# Patient Record
Sex: Male | Born: 1970 | Race: White | Hispanic: No | Marital: Married | Smoking: Never smoker
Health system: Southern US, Community
[De-identification: ages and names within clinical notes are randomized; demographics above are authoritative.]

## PROBLEM LIST (undated history)

## (undated) DIAGNOSIS — Z973 Presence of spectacles and contact lenses: Secondary | ICD-10-CM

## (undated) DIAGNOSIS — M1990 Unspecified osteoarthritis, unspecified site: Secondary | ICD-10-CM

## (undated) DIAGNOSIS — K642 Third degree hemorrhoids: Secondary | ICD-10-CM

## (undated) DIAGNOSIS — F329 Major depressive disorder, single episode, unspecified: Secondary | ICD-10-CM

## (undated) DIAGNOSIS — Z9689 Presence of other specified functional implants: Secondary | ICD-10-CM

## (undated) DIAGNOSIS — Z915 Personal history of self-harm: Secondary | ICD-10-CM

## (undated) DIAGNOSIS — G4731 Primary central sleep apnea: Secondary | ICD-10-CM

## (undated) DIAGNOSIS — M545 Low back pain, unspecified: Secondary | ICD-10-CM

## (undated) DIAGNOSIS — F419 Anxiety disorder, unspecified: Secondary | ICD-10-CM

## (undated) DIAGNOSIS — G4739 Other sleep apnea: Secondary | ICD-10-CM

## (undated) DIAGNOSIS — G8929 Other chronic pain: Secondary | ICD-10-CM

## (undated) DIAGNOSIS — M2619 Other specified anomalies of jaw-cranial base relationship: Secondary | ICD-10-CM

## (undated) DIAGNOSIS — Z9151 Personal history of suicidal behavior: Secondary | ICD-10-CM

## (undated) DIAGNOSIS — Z9889 Other specified postprocedural states: Secondary | ICD-10-CM

## (undated) DIAGNOSIS — I10 Essential (primary) hypertension: Secondary | ICD-10-CM

## (undated) DIAGNOSIS — F1021 Alcohol dependence, in remission: Secondary | ICD-10-CM

## (undated) DIAGNOSIS — K601 Chronic anal fissure: Secondary | ICD-10-CM

## (undated) DIAGNOSIS — F1920 Other psychoactive substance dependence, uncomplicated: Secondary | ICD-10-CM

## (undated) DIAGNOSIS — F1120 Opioid dependence, uncomplicated: Secondary | ICD-10-CM

## (undated) HISTORY — DX: Other specified anomalies of jaw-cranial base relationship: M26.19

---

## 2002-01-04 ENCOUNTER — Ambulatory Visit (HOSPITAL_COMMUNITY)
Admission: RE | Admit: 2002-01-04 | Discharge: 2002-01-04 | Payer: Self-pay | Admitting: Physical Medicine and Rehabilitation

## 2002-01-04 ENCOUNTER — Encounter: Payer: Self-pay | Admitting: Physical Medicine and Rehabilitation

## 2002-02-15 ENCOUNTER — Encounter: Payer: Self-pay | Admitting: Orthopedic Surgery

## 2002-02-15 ENCOUNTER — Ambulatory Visit (HOSPITAL_COMMUNITY): Admission: RE | Admit: 2002-02-15 | Discharge: 2002-02-15 | Payer: Self-pay | Admitting: Orthopedic Surgery

## 2002-06-05 ENCOUNTER — Encounter: Payer: Self-pay | Admitting: Physical Medicine and Rehabilitation

## 2002-06-05 ENCOUNTER — Encounter
Admission: RE | Admit: 2002-06-05 | Discharge: 2002-06-05 | Payer: Self-pay | Admitting: Physical Medicine and Rehabilitation

## 2002-09-09 ENCOUNTER — Encounter: Payer: Self-pay | Admitting: Orthopaedic Surgery

## 2002-09-11 ENCOUNTER — Inpatient Hospital Stay (HOSPITAL_COMMUNITY): Admission: RE | Admit: 2002-09-11 | Discharge: 2002-09-13 | Payer: Self-pay | Admitting: Orthopaedic Surgery

## 2002-09-11 ENCOUNTER — Encounter: Payer: Self-pay | Admitting: Orthopaedic Surgery

## 2002-09-11 HISTORY — PX: LUMBAR LAMINECTOMY/DECOMPRESSION MICRODISCECTOMY: SHX5026

## 2002-09-13 ENCOUNTER — Encounter: Payer: Self-pay | Admitting: Orthopaedic Surgery

## 2003-07-23 ENCOUNTER — Encounter
Admission: RE | Admit: 2003-07-23 | Discharge: 2003-10-21 | Payer: Self-pay | Admitting: Physical Medicine & Rehabilitation

## 2003-07-30 ENCOUNTER — Encounter
Admission: RE | Admit: 2003-07-30 | Discharge: 2003-09-18 | Payer: Self-pay | Admitting: Physical Medicine & Rehabilitation

## 2003-11-19 ENCOUNTER — Encounter
Admission: RE | Admit: 2003-11-19 | Discharge: 2004-02-17 | Payer: Self-pay | Admitting: Physical Medicine & Rehabilitation

## 2003-11-28 ENCOUNTER — Ambulatory Visit (HOSPITAL_COMMUNITY): Admission: RE | Admit: 2003-11-28 | Discharge: 2003-11-28 | Payer: Self-pay | Admitting: Neurological Surgery

## 2003-12-09 ENCOUNTER — Ambulatory Visit (HOSPITAL_COMMUNITY): Admission: RE | Admit: 2003-12-09 | Discharge: 2003-12-09 | Payer: Self-pay | Admitting: Neurological Surgery

## 2004-01-29 ENCOUNTER — Inpatient Hospital Stay (HOSPITAL_COMMUNITY): Admission: RE | Admit: 2004-01-29 | Discharge: 2004-02-01 | Payer: Self-pay | Admitting: Neurological Surgery

## 2004-01-29 HISTORY — PX: OTHER SURGICAL HISTORY: SHX169

## 2004-04-05 ENCOUNTER — Encounter: Admission: RE | Admit: 2004-04-05 | Discharge: 2004-06-14 | Payer: Self-pay | Admitting: Neurological Surgery

## 2005-03-03 ENCOUNTER — Ambulatory Visit (HOSPITAL_COMMUNITY): Admission: RE | Admit: 2005-03-03 | Discharge: 2005-03-03 | Payer: Self-pay | Admitting: Neurological Surgery

## 2005-03-18 ENCOUNTER — Ambulatory Visit (HOSPITAL_COMMUNITY): Admission: RE | Admit: 2005-03-18 | Discharge: 2005-03-18 | Payer: Self-pay | Admitting: Neurological Surgery

## 2005-08-04 ENCOUNTER — Inpatient Hospital Stay (HOSPITAL_COMMUNITY): Admission: RE | Admit: 2005-08-04 | Discharge: 2005-08-06 | Payer: Self-pay | Admitting: Neurological Surgery

## 2005-08-04 HISTORY — PX: OTHER SURGICAL HISTORY: SHX169

## 2005-08-22 ENCOUNTER — Ambulatory Visit: Payer: Self-pay | Admitting: Family Medicine

## 2005-08-22 ENCOUNTER — Emergency Department (HOSPITAL_COMMUNITY): Admission: EM | Admit: 2005-08-22 | Discharge: 2005-08-22 | Payer: Self-pay | Admitting: Emergency Medicine

## 2006-05-01 ENCOUNTER — Other Ambulatory Visit (HOSPITAL_COMMUNITY): Admission: RE | Admit: 2006-05-01 | Discharge: 2006-07-30 | Payer: Self-pay | Admitting: Psychiatry

## 2006-05-11 ENCOUNTER — Ambulatory Visit: Payer: Self-pay | Admitting: Psychiatry

## 2007-02-27 ENCOUNTER — Encounter: Admission: RE | Admit: 2007-02-27 | Discharge: 2007-02-27 | Payer: Self-pay | Admitting: Neurosurgery

## 2007-03-02 ENCOUNTER — Ambulatory Visit (HOSPITAL_COMMUNITY): Admission: RE | Admit: 2007-03-02 | Discharge: 2007-03-03 | Payer: Self-pay | Admitting: Neurosurgery

## 2007-03-02 HISTORY — PX: THORACIC DISCECTOMY: SHX100

## 2007-11-15 ENCOUNTER — Encounter
Admission: RE | Admit: 2007-11-15 | Discharge: 2008-02-13 | Payer: Self-pay | Admitting: Physical Medicine & Rehabilitation

## 2008-01-03 ENCOUNTER — Ambulatory Visit: Payer: Self-pay | Admitting: Physical Medicine & Rehabilitation

## 2008-02-27 ENCOUNTER — Encounter
Admission: RE | Admit: 2008-02-27 | Discharge: 2008-04-25 | Payer: Self-pay | Admitting: Physical Medicine & Rehabilitation

## 2008-02-29 ENCOUNTER — Ambulatory Visit: Payer: Self-pay | Admitting: Physical Medicine & Rehabilitation

## 2008-04-25 ENCOUNTER — Ambulatory Visit: Payer: Self-pay | Admitting: Physical Medicine & Rehabilitation

## 2008-08-12 ENCOUNTER — Encounter
Admission: RE | Admit: 2008-08-12 | Discharge: 2008-08-13 | Payer: Self-pay | Admitting: Physical Medicine & Rehabilitation

## 2008-08-13 ENCOUNTER — Ambulatory Visit: Payer: Self-pay | Admitting: Physical Medicine & Rehabilitation

## 2008-09-06 ENCOUNTER — Encounter: Admission: RE | Admit: 2008-09-06 | Discharge: 2008-09-06 | Payer: Self-pay | Admitting: Neurosurgery

## 2010-06-27 HISTORY — PX: NASAL SINUS SURGERY: SHX719

## 2010-08-21 ENCOUNTER — Emergency Department (HOSPITAL_BASED_OUTPATIENT_CLINIC_OR_DEPARTMENT_OTHER)
Admission: EM | Admit: 2010-08-21 | Discharge: 2010-08-21 | Disposition: A | Discharge status: Home / Self Care | Payer: 59 | Attending: Emergency Medicine | Admitting: Emergency Medicine

## 2010-08-21 DIAGNOSIS — E785 Hyperlipidemia, unspecified: Secondary | ICD-10-CM | POA: Insufficient documentation

## 2010-08-21 DIAGNOSIS — F1910 Other psychoactive substance abuse, uncomplicated: Secondary | ICD-10-CM | POA: Insufficient documentation

## 2010-08-21 DIAGNOSIS — T50991A Poisoning by other drugs, medicaments and biological substances, accidental (unintentional), initial encounter: Secondary | ICD-10-CM | POA: Insufficient documentation

## 2010-08-21 DIAGNOSIS — I10 Essential (primary) hypertension: Secondary | ICD-10-CM | POA: Insufficient documentation

## 2010-08-21 LAB — URINALYSIS, ROUTINE W REFLEX MICROSCOPIC
Bilirubin Urine: NEGATIVE
Hgb urine dipstick: NEGATIVE
Ketones, ur: NEGATIVE mg/dL
Nitrite: NEGATIVE
Protein, ur: NEGATIVE mg/dL
Specific Gravity, Urine: 1.015 (ref 1.005–1.030)
Urine Glucose, Fasting: NEGATIVE mg/dL
Urobilinogen, UA: 0.2 mg/dL (ref 0.0–1.0)
pH: 5.5 (ref 5.0–8.0)

## 2010-08-21 LAB — COMPREHENSIVE METABOLIC PANEL
ALT: 38 U/L (ref 0–53)
AST: 33 U/L (ref 0–37)
Albumin: 5.4 g/dL — ABNORMAL HIGH (ref 3.5–5.2)
Alkaline Phosphatase: 106 U/L (ref 39–117)
BUN: 14 mg/dL (ref 6–23)
CO2: 20 mEq/L (ref 19–32)
Calcium: 9.9 mg/dL (ref 8.4–10.5)
Chloride: 103 mEq/L (ref 96–112)
Creatinine, Ser: 0.9 mg/dL (ref 0.4–1.5)
GFR calc Af Amer: >60 mL/min (ref >60)
GFR calc non Af Amer: >60 mL/min (ref >60)
Glucose, Bld: 129 mg/dL — ABNORMAL HIGH (ref 70–99)
Potassium: 4.3 mEq/L (ref 3.5–5.1)
Sodium: 142 mEq/L (ref 135–145)
Total Bilirubin: 1.1 mg/dL (ref 0.3–1.2)
Total Protein: 9 g/dL — ABNORMAL HIGH (ref 6.0–8.3)

## 2010-08-21 LAB — CBC
HCT: 40.7 % (ref 39.0–52.0)
Hemoglobin: 14.8 g/dL (ref 13.0–17.0)
MCH: 31.6 pg (ref 26.0–34.0)
MCHC: 36.4 g/dL — ABNORMAL HIGH (ref 30.0–36.0)
MCV: 86.8 fL (ref 78.0–100.0)
Platelets: 192 10*3/uL (ref 150–400)
RBC: 4.69 MIL/uL (ref 4.22–5.81)
RDW: 13.6 % (ref 11.5–15.5)
WBC: 8.5 10*3/uL (ref 4.0–10.5)

## 2010-08-21 LAB — ETHANOL: Alcohol, Ethyl (B): <10 mg/dL (ref 0–10)

## 2010-08-21 LAB — DIFFERENTIAL
Basophils Absolute: 0 10*3/uL (ref 0.0–0.1)
Basophils Relative: 0 % (ref 0–1)
Eosinophils Absolute: 0 10*3/uL (ref 0.0–0.7)
Eosinophils Relative: 0 % (ref 0–5)
Lymphocytes Relative: 20 % (ref 12–46)
Lymphs Abs: 1.7 10*3/uL (ref 0.7–4.0)
Monocytes Absolute: 0.7 10*3/uL (ref 0.1–1.0)
Monocytes Relative: 8 % (ref 3–12)
Neutro Abs: 6.1 10*3/uL (ref 1.7–7.7)
Neutrophils Relative %: 72 % (ref 43–77)

## 2010-08-21 LAB — POCT TOXICOLOGY PANEL
Cocaine: POSITIVE
TCA Scrn: POSITIVE

## 2010-08-22 ENCOUNTER — Emergency Department (HOSPITAL_COMMUNITY)
Admission: EM | Admit: 2010-08-22 | Discharge: 2010-08-23 | Disposition: A | Discharge status: Other Acute Inpatient Hospital | Payer: 59 | Attending: Emergency Medicine | Admitting: Emergency Medicine

## 2010-08-22 ENCOUNTER — Emergency Department (HOSPITAL_COMMUNITY): Payer: 59

## 2010-08-22 DIAGNOSIS — R45851 Suicidal ideations: Secondary | ICD-10-CM | POA: Insufficient documentation

## 2010-08-22 DIAGNOSIS — I10 Essential (primary) hypertension: Secondary | ICD-10-CM | POA: Insufficient documentation

## 2010-08-22 DIAGNOSIS — F17200 Nicotine dependence, unspecified, uncomplicated: Secondary | ICD-10-CM | POA: Insufficient documentation

## 2010-08-22 DIAGNOSIS — F1410 Cocaine abuse, uncomplicated: Secondary | ICD-10-CM | POA: Insufficient documentation

## 2010-08-22 DIAGNOSIS — E785 Hyperlipidemia, unspecified: Secondary | ICD-10-CM | POA: Insufficient documentation

## 2010-08-22 DIAGNOSIS — Z79899 Other long term (current) drug therapy: Secondary | ICD-10-CM | POA: Insufficient documentation

## 2010-08-22 DIAGNOSIS — F1910 Other psychoactive substance abuse, uncomplicated: Secondary | ICD-10-CM | POA: Insufficient documentation

## 2010-08-22 LAB — CBC
HCT: 43.4 % (ref 39.0–52.0)
Hemoglobin: 15.2 g/dL (ref 13.0–17.0)
MCH: 32.1 pg (ref 26.0–34.0)
MCHC: 35 g/dL (ref 30.0–36.0)
MCV: 91.8 fL (ref 78.0–100.0)
Platelets: 160 10*3/uL (ref 150–400)
RBC: 4.73 MIL/uL (ref 4.22–5.81)
RDW: 13.4 % (ref 11.5–15.5)
WBC: 6.4 10*3/uL (ref 4.0–10.5)

## 2010-08-22 LAB — COMPREHENSIVE METABOLIC PANEL
ALT: 25 U/L (ref 0–53)
AST: 23 U/L (ref 0–37)
Albumin: 4.6 g/dL (ref 3.5–5.2)
Alkaline Phosphatase: 102 U/L (ref 39–117)
BUN: 14 mg/dL (ref 6–23)
CO2: 24 mEq/L (ref 19–32)
Calcium: 9.8 mg/dL (ref 8.4–10.5)
Chloride: 104 mEq/L (ref 96–112)
Creatinine, Ser: 0.92 mg/dL (ref 0.4–1.5)
GFR calc Af Amer: >60 mL/min (ref >60)
GFR calc non Af Amer: >60 mL/min (ref >60)
Glucose, Bld: 96 mg/dL (ref 70–99)
Potassium: 4 mEq/L (ref 3.5–5.1)
Sodium: 135 mEq/L (ref 135–145)
Total Bilirubin: 1.2 mg/dL (ref 0.3–1.2)
Total Protein: 8.3 g/dL (ref 6.0–8.3)

## 2010-08-22 LAB — URINALYSIS, ROUTINE W REFLEX MICROSCOPIC
Bilirubin Urine: NEGATIVE
Hgb urine dipstick: NEGATIVE
Ketones, ur: NEGATIVE mg/dL
Nitrite: NEGATIVE
Protein, ur: NEGATIVE mg/dL
Specific Gravity, Urine: 1.013 (ref 1.005–1.030)
Urine Glucose, Fasting: NEGATIVE mg/dL
Urobilinogen, UA: 0.2 mg/dL (ref 0.0–1.0)
pH: 6 (ref 5.0–8.0)

## 2010-08-22 LAB — RAPID URINE DRUG SCREEN, HOSP PERFORMED
Amphetamines: NOT DETECTED
Barbiturates: NOT DETECTED
Benzodiazepines: POSITIVE — AB
Cocaine: POSITIVE — AB
Opiates: NOT DETECTED
Tetrahydrocannabinol: NOT DETECTED

## 2010-08-22 LAB — DIFFERENTIAL
Basophils Absolute: 0 10*3/uL (ref 0.0–0.1)
Basophils Relative: 0 % (ref 0–1)
Eosinophils Absolute: 0.1 10*3/uL (ref 0.0–0.7)
Eosinophils Relative: 1 % (ref 0–5)
Lymphocytes Relative: 23 % (ref 12–46)
Lymphs Abs: 1.5 10*3/uL (ref 0.7–4.0)
Monocytes Absolute: 0.4 10*3/uL (ref 0.1–1.0)
Monocytes Relative: 6 % (ref 3–12)
Neutro Abs: 4.5 10*3/uL (ref 1.7–7.7)
Neutrophils Relative %: 70 % (ref 43–77)

## 2010-08-22 LAB — PROTIME-INR
INR: 1.02 (ref 0.00–1.49)
Prothrombin Time: 13.6 seconds (ref 11.6–15.2)

## 2010-08-22 LAB — AMMONIA: Ammonia: 21 umol/L (ref 11–35)

## 2010-08-22 LAB — APTT: aPTT: 27 seconds (ref 24–37)

## 2010-08-22 LAB — ACETAMINOPHEN LEVEL: Acetaminophen (Tylenol), Serum: <10 ug/mL — ABNORMAL LOW (ref 10–30)

## 2010-08-22 LAB — ETHANOL: Alcohol, Ethyl (B): <5 mg/dL (ref 0–10)

## 2010-08-22 LAB — SALICYLATE LEVEL: Salicylate Lvl: <4 mg/dL (ref 2.8–20.0)

## 2010-08-22 LAB — OSMOLALITY: Osmolality: 285 mOsm/kg (ref 275–300)

## 2010-08-23 ENCOUNTER — Inpatient Hospital Stay (HOSPITAL_COMMUNITY)
Admission: AD | Admit: 2010-08-23 | Discharge: 2010-08-25 | DRG: 897 | Disposition: A | Discharge status: Home / Self Care | Payer: 59 | Source: Ambulatory Visit | Attending: Psychiatry | Admitting: Psychiatry

## 2010-08-23 DIAGNOSIS — F1910 Other psychoactive substance abuse, uncomplicated: Secondary | ICD-10-CM

## 2010-08-23 DIAGNOSIS — I10 Essential (primary) hypertension: Secondary | ICD-10-CM

## 2010-08-23 DIAGNOSIS — Z880 Allergy status to penicillin: Secondary | ICD-10-CM

## 2010-08-23 DIAGNOSIS — F319 Bipolar disorder, unspecified: Secondary | ICD-10-CM

## 2010-08-23 DIAGNOSIS — F1010 Alcohol abuse, uncomplicated: Secondary | ICD-10-CM

## 2010-08-23 DIAGNOSIS — M549 Dorsalgia, unspecified: Secondary | ICD-10-CM

## 2010-08-23 DIAGNOSIS — F39 Unspecified mood [affective] disorder: Secondary | ICD-10-CM

## 2010-08-23 DIAGNOSIS — F1994 Other psychoactive substance use, unspecified with psychoactive substance-induced mood disorder: Principal | ICD-10-CM

## 2010-08-24 LAB — URINE CULTURE
Colony Count: NO GROWTH
Culture  Setup Time: 201202262106
Culture: NO GROWTH

## 2010-08-25 NOTE — H&P (Signed)
NAMEJIGAR, ZIELKE NO.:  [id number]  MEDICAL RECORD NO.:  [id number]           PATIENT TYPE:  I  LOCATION:  0301                          FACILITY:  [facility]  PHYSICIAN:  Anselm Jungling, MD  DATE OF BIRTH:  1971/05/14  DATE OF ADMISSION:  08/23/2010 DATE OF DISCHARGE:                      PSYCHIATRIC ADMISSION ASSESSMENT   A 40 year old male voluntarily admitted on August 23, 2010.  HISTORY OF PRESENT ILLNESS:  The patient states that he had gotten drunk, he did not want to "face the music" from his wife and took some Ambien and Flexeril to "sleep it off".  He denied this was a suicide attempt.  He has a history of alcohol and substance use.  PAST PSYCHIATRIC HISTORY:  First admission to [facility]. He has been in rehab 3 times before with his last rehab about 3 years ago and that was for alcohol and cocaine.  Sees Dr. Evelene Croon for depression and anxiety on an outpatient basis.  SOCIAL HISTORY:  The patient is married.  He is on disability for history of back surgeries.  He resides in [place].  FAMILY HISTORY:  Unknown.  ALCOHOL AND DRUG HISTORY:  As above.  Drinking and a history of cocaine and marijuana use.  PRIMARY CARE PROVIDER:  Dr. Jerline Pain.  MEDICAL PROBLEMS:  History of hypertension, 4 back surgeries with his last surgery in 2009.  MEDICATIONS: 1. Takes Neurontin 600 mg b.i.d. 2. Zoloft 100 mg daily.  Has been on Zoloft for approximately 4 years.     Reports compliance. 3. Atenolol 25 mg daily. 4. Norvasc 10 mg daily. 5. Oxycodone for back pain. 6. Flexeril for back pain.  DRUG ALLERGIES:  LISINOPRIL with a problem with swelling, PENICILLIN.  PHYSICAL EXAM:  Was done at the emergency department.  This is a middle- aged male, well-nourished, in no acute distress.  He offers no complaints.  His urine drug screen positive for cocaine, positive for benzodiazepines.  Ammonia level of 25.  Salicylate less than  4. Acetaminophen level less than 10.  MENTAL STATUS EXAM:  He is fully alert and cooperative, resting in bed with good eye contact.  His mood is euthymic.  Affect he appears somewhat depressed.  Thought process are coherent, goal directed.  No evidence of any psychotic symptoms.  Denies any suicidal or homicidal thoughts.  Cognitive function intact.  Memory appears to be good. Judgment and insight are fair.  The patient is not clear what he wants to do after discharge about going back to rehab center.  He knows he needs to stop.  DIAGNOSES:  AXIS I:  Mood disorder, polysubstance abuse. AXIS II:  Deferred. AXIS III:  History of hypertension and back pain. AXIS IV:  Problems with primary support and medical issues. AXIS V:  Current is 40.  PLAN:  To put the patient on Librium protocol, monitor withdrawal symptoms, resume his Zoloft and hypertensive medications.  Have contact with his wife.  Assess his motivation for rehab.  His tentative length of stay at this time is 2-4 days.     Landry Corporal, N.P.   ______________________________ Anselm Jungling, MD  JO/MEDQ  D:  08/23/2010  T:  08/23/2010  Job:  [id number]  Electronically Signed by Limmie Patricia.P. on 08/24/2010 10:47:41 AM Electronically Signed by Geralyn Flash MD on 08/25/2010 12:56:35 PM

## 2010-08-26 ENCOUNTER — Other Ambulatory Visit: Payer: Self-pay | Admitting: Student

## 2010-08-26 ENCOUNTER — Other Ambulatory Visit: Payer: Self-pay | Admitting: *Deleted

## 2010-08-26 DIAGNOSIS — M545 Low back pain, unspecified: Secondary | ICD-10-CM

## 2010-08-30 ENCOUNTER — Other Ambulatory Visit (HOSPITAL_COMMUNITY): Payer: 59 | Attending: Psychiatry | Admitting: Psychiatry

## 2010-08-30 DIAGNOSIS — F1020 Alcohol dependence, uncomplicated: Secondary | ICD-10-CM | POA: Insufficient documentation

## 2010-08-30 DIAGNOSIS — F3289 Other specified depressive episodes: Secondary | ICD-10-CM | POA: Insufficient documentation

## 2010-08-30 DIAGNOSIS — F329 Major depressive disorder, single episode, unspecified: Secondary | ICD-10-CM | POA: Insufficient documentation

## 2010-08-31 ENCOUNTER — Ambulatory Visit
Admission: RE | Admit: 2010-08-31 | Discharge: 2010-08-31 | Disposition: A | Discharge status: Home / Self Care | Payer: 59 | Source: Ambulatory Visit | Attending: *Deleted | Admitting: *Deleted

## 2010-08-31 DIAGNOSIS — M545 Low back pain, unspecified: Secondary | ICD-10-CM

## 2010-08-31 MED ORDER — GADOBENATE DIMEGLUMINE 529 MG/ML IV SOLN
20.0000 mL | Freq: Once | INTRAVENOUS | Status: AC | PRN
  Administered 2010-08-31: 20 mL via INTRAVENOUS

## 2010-09-01 ENCOUNTER — Other Ambulatory Visit (HOSPITAL_BASED_OUTPATIENT_CLINIC_OR_DEPARTMENT_OTHER): Payer: 59 | Admitting: Psychiatry

## 2010-09-01 DIAGNOSIS — F1020 Alcohol dependence, uncomplicated: Secondary | ICD-10-CM

## 2010-09-01 DIAGNOSIS — F1120 Opioid dependence, uncomplicated: Secondary | ICD-10-CM

## 2010-09-03 ENCOUNTER — Other Ambulatory Visit (HOSPITAL_COMMUNITY): Payer: 59 | Admitting: Psychiatry

## 2010-09-06 ENCOUNTER — Other Ambulatory Visit (HOSPITAL_COMMUNITY): Payer: 59 | Admitting: Psychiatry

## 2010-09-08 ENCOUNTER — Other Ambulatory Visit (HOSPITAL_COMMUNITY): Payer: Self-pay | Admitting: Psychiatry

## 2010-09-08 ENCOUNTER — Other Ambulatory Visit (HOSPITAL_COMMUNITY): Payer: 59 | Admitting: Psychiatry

## 2010-09-09 LAB — URINE DRUGS OF ABUSE SCREEN W ALC, ROUTINE (REF LAB)
Amphetamine Screen, Ur: NEGATIVE
Barbiturate Quant, Ur: NEGATIVE
Benzodiazepines.: POSITIVE — AB
Cocaine Metabolites: NEGATIVE
Creatinine,U: 218.3 mg/dL
Ethyl Alcohol: <10 mg/dL (ref <10)
Marijuana Metabolite: NEGATIVE
Methadone: NEGATIVE
Opiate Screen, Urine: NEGATIVE
Phencyclidine (PCP): NEGATIVE
Propoxyphene: NEGATIVE

## 2010-09-10 ENCOUNTER — Other Ambulatory Visit (HOSPITAL_COMMUNITY): Payer: 59 | Admitting: Psychiatry

## 2010-09-13 ENCOUNTER — Other Ambulatory Visit (HOSPITAL_COMMUNITY): Payer: 59 | Admitting: Psychiatry

## 2010-09-14 LAB — BENZODIAZEPINE, QUANTITATIVE, URINE
Alprazolam (GC/LC/MS), ur confirm: NEGATIVE NG/ML
Flurazepam GC/MS Conf: NEGATIVE NG/ML
Lorazepam UR QT: NEGATIVE NG/ML
Nordiazepam GC/MS Conf: NEGATIVE NG/ML
Oxazepam GC/MS Conf: NEGATIVE NG/ML
Temazepam GC/MS Conf: NEGATIVE NG/ML

## 2010-09-15 ENCOUNTER — Other Ambulatory Visit (HOSPITAL_COMMUNITY): Payer: 59 | Admitting: Psychiatry

## 2010-09-17 ENCOUNTER — Other Ambulatory Visit (HOSPITAL_COMMUNITY): Payer: 59 | Admitting: Psychiatry

## 2010-09-20 ENCOUNTER — Other Ambulatory Visit (HOSPITAL_COMMUNITY): Payer: 59 | Admitting: Psychiatry

## 2010-09-22 ENCOUNTER — Other Ambulatory Visit (HOSPITAL_COMMUNITY): Payer: 59 | Admitting: Psychiatry

## 2010-09-24 ENCOUNTER — Other Ambulatory Visit (HOSPITAL_COMMUNITY): Payer: Self-pay | Admitting: Psychiatry

## 2010-09-24 ENCOUNTER — Other Ambulatory Visit (HOSPITAL_COMMUNITY): Payer: 59 | Admitting: Psychiatry

## 2010-09-25 LAB — URINE DRUGS OF ABUSE SCREEN W ALC, ROUTINE (REF LAB)
Amphetamine Screen, Ur: NEGATIVE
Barbiturate Quant, Ur: NEGATIVE
Benzodiazepines.: NEGATIVE
Cocaine Metabolites: NEGATIVE
Creatinine,U: 156.5 mg/dL
Ethyl Alcohol: <10 mg/dL (ref <10)
Marijuana Metabolite: NEGATIVE
Methadone: NEGATIVE
Opiate Screen, Urine: NEGATIVE
Phencyclidine (PCP): NEGATIVE
Propoxyphene: NEGATIVE

## 2010-09-27 ENCOUNTER — Other Ambulatory Visit (HOSPITAL_COMMUNITY): Payer: 59 | Attending: Psychiatry | Admitting: Psychiatry

## 2010-09-27 DIAGNOSIS — F329 Major depressive disorder, single episode, unspecified: Secondary | ICD-10-CM | POA: Insufficient documentation

## 2010-09-27 DIAGNOSIS — F1020 Alcohol dependence, uncomplicated: Secondary | ICD-10-CM | POA: Insufficient documentation

## 2010-09-27 DIAGNOSIS — F3289 Other specified depressive episodes: Secondary | ICD-10-CM | POA: Insufficient documentation

## 2010-09-29 ENCOUNTER — Other Ambulatory Visit (HOSPITAL_COMMUNITY): Payer: 59 | Admitting: Psychiatry

## 2010-10-01 ENCOUNTER — Other Ambulatory Visit (HOSPITAL_COMMUNITY): Payer: 59 | Admitting: Psychiatry

## 2010-10-04 ENCOUNTER — Other Ambulatory Visit (HOSPITAL_COMMUNITY): Payer: 59 | Admitting: Psychiatry

## 2010-10-06 ENCOUNTER — Other Ambulatory Visit (HOSPITAL_COMMUNITY): Payer: Self-pay | Admitting: Psychiatry

## 2010-10-06 ENCOUNTER — Other Ambulatory Visit (HOSPITAL_COMMUNITY): Payer: 59 | Admitting: Psychiatry

## 2010-10-07 LAB — URINE DRUGS OF ABUSE SCREEN W ALC, ROUTINE (REF LAB)
Amphetamine Screen, Ur: POSITIVE — AB
Barbiturate Quant, Ur: NEGATIVE
Benzodiazepines.: POSITIVE — AB
Cocaine Metabolites: NEGATIVE
Creatinine,U: 249 mg/dL
Ethyl Alcohol: <10 mg/dL (ref <10)
Marijuana Metabolite: NEGATIVE
Methadone: NEGATIVE
Opiate Screen, Urine: NEGATIVE
Phencyclidine (PCP): NEGATIVE
Propoxyphene: NEGATIVE

## 2010-10-08 ENCOUNTER — Other Ambulatory Visit (HOSPITAL_COMMUNITY): Payer: 59 | Admitting: Psychiatry

## 2010-10-08 ENCOUNTER — Other Ambulatory Visit (HOSPITAL_COMMUNITY): Payer: Self-pay | Admitting: Psychiatry

## 2010-10-09 LAB — URINE DRUGS OF ABUSE SCREEN W ALC, ROUTINE (REF LAB)
Amphetamine Screen, Ur: NEGATIVE
Barbiturate Quant, Ur: NEGATIVE
Benzodiazepines.: NEGATIVE
Cocaine Metabolites: NEGATIVE
Creatinine,U: 130.5 mg/dL
Ethyl Alcohol: <10 mg/dL (ref <10)
Marijuana Metabolite: NEGATIVE
Methadone: NEGATIVE
Opiate Screen, Urine: NEGATIVE
Phencyclidine (PCP): NEGATIVE
Propoxyphene: NEGATIVE

## 2010-10-11 ENCOUNTER — Other Ambulatory Visit (HOSPITAL_COMMUNITY): Payer: 59 | Admitting: Psychiatry

## 2010-10-11 LAB — AMPHETAMINES URINE CONFIRMATION
Amphetamines: 1714 NG/ML — ABNORMAL HIGH
Methamphetamine GC/MS, Ur: NEGATIVE NG/ML
Methylenedioxyamphetamine: NEGATIVE NG/ML
Methylenedioxyethylamphetamine: NEGATIVE NG/ML
Methylenedioxymethamphetamine: NEGATIVE NG/ML

## 2010-10-11 LAB — BENZODIAZEPINE, QUANTITATIVE, URINE
Alprazolam (GC/LC/MS), ur confirm: NEGATIVE NG/ML
Flurazepam GC/MS Conf: NEGATIVE NG/ML
Lorazepam UR QT: NEGATIVE NG/ML
Nordiazepam GC/MS Conf: NEGATIVE NG/ML
Oxazepam GC/MS Conf: NEGATIVE NG/ML
Temazepam GC/MS Conf: NEGATIVE NG/ML

## 2010-10-13 ENCOUNTER — Other Ambulatory Visit (HOSPITAL_COMMUNITY): Payer: 59 | Admitting: Psychiatry

## 2010-10-15 ENCOUNTER — Other Ambulatory Visit (HOSPITAL_COMMUNITY): Payer: 59 | Admitting: Psychiatry

## 2010-10-18 ENCOUNTER — Other Ambulatory Visit (HOSPITAL_COMMUNITY): Payer: 59 | Admitting: Psychiatry

## 2010-10-20 ENCOUNTER — Other Ambulatory Visit (HOSPITAL_COMMUNITY): Payer: 59 | Admitting: Psychiatry

## 2010-10-22 ENCOUNTER — Other Ambulatory Visit (HOSPITAL_COMMUNITY): Payer: 59 | Admitting: Psychiatry

## 2010-10-25 ENCOUNTER — Other Ambulatory Visit (HOSPITAL_COMMUNITY): Payer: 59 | Attending: Psychiatry | Admitting: Psychiatry

## 2010-10-25 DIAGNOSIS — F1020 Alcohol dependence, uncomplicated: Secondary | ICD-10-CM | POA: Insufficient documentation

## 2010-10-25 DIAGNOSIS — F329 Major depressive disorder, single episode, unspecified: Secondary | ICD-10-CM | POA: Insufficient documentation

## 2010-10-25 DIAGNOSIS — F3289 Other specified depressive episodes: Secondary | ICD-10-CM | POA: Insufficient documentation

## 2010-10-27 ENCOUNTER — Other Ambulatory Visit (HOSPITAL_COMMUNITY): Payer: 59 | Attending: Psychiatry | Admitting: Psychiatry

## 2010-10-27 ENCOUNTER — Other Ambulatory Visit (HOSPITAL_COMMUNITY): Payer: Self-pay | Admitting: Psychiatry

## 2010-10-27 DIAGNOSIS — F329 Major depressive disorder, single episode, unspecified: Secondary | ICD-10-CM | POA: Insufficient documentation

## 2010-10-27 DIAGNOSIS — F3289 Other specified depressive episodes: Secondary | ICD-10-CM | POA: Insufficient documentation

## 2010-10-27 DIAGNOSIS — F1020 Alcohol dependence, uncomplicated: Secondary | ICD-10-CM | POA: Insufficient documentation

## 2010-10-28 LAB — URINE DRUGS OF ABUSE SCREEN W ALC, ROUTINE (REF LAB)
Amphetamine Screen, Ur: NEGATIVE
Barbiturate Quant, Ur: NEGATIVE
Benzodiazepines.: NEGATIVE
Cocaine Metabolites: NEGATIVE
Creatinine,U: 193.5 mg/dL
Ethyl Alcohol: <10 mg/dL (ref <10)
Marijuana Metabolite: NEGATIVE
Methadone: NEGATIVE
Opiate Screen, Urine: NEGATIVE
Phencyclidine (PCP): NEGATIVE
Propoxyphene: NEGATIVE

## 2010-10-29 ENCOUNTER — Other Ambulatory Visit (HOSPITAL_COMMUNITY): Payer: 59 | Admitting: Psychiatry

## 2010-11-01 ENCOUNTER — Other Ambulatory Visit (HOSPITAL_COMMUNITY): Payer: 59 | Admitting: Psychiatry

## 2010-11-01 ENCOUNTER — Other Ambulatory Visit (HOSPITAL_COMMUNITY): Payer: 59 | Attending: Psychiatry | Admitting: Psychiatry

## 2010-11-03 ENCOUNTER — Other Ambulatory Visit (HOSPITAL_COMMUNITY): Payer: 59 | Admitting: Psychiatry

## 2010-11-05 ENCOUNTER — Other Ambulatory Visit (HOSPITAL_COMMUNITY): Payer: Self-pay | Admitting: Psychiatry

## 2010-11-05 ENCOUNTER — Other Ambulatory Visit (HOSPITAL_COMMUNITY): Payer: 59 | Admitting: Psychiatry

## 2010-11-06 LAB — URINE DRUGS OF ABUSE SCREEN W ALC, ROUTINE (REF LAB)
Amphetamine Screen, Ur: NEGATIVE
Barbiturate Quant, Ur: NEGATIVE
Benzodiazepines.: NEGATIVE
Cocaine Metabolites: NEGATIVE
Creatinine,U: 103.1 mg/dL
Ethyl Alcohol: <10 mg/dL (ref <10)
Marijuana Metabolite: NEGATIVE
Methadone: NEGATIVE
Opiate Screen, Urine: NEGATIVE
Phencyclidine (PCP): NEGATIVE
Propoxyphene: NEGATIVE

## 2010-11-08 ENCOUNTER — Other Ambulatory Visit (HOSPITAL_COMMUNITY): Payer: 59 | Admitting: Psychiatry

## 2010-11-09 NOTE — Assessment & Plan Note (Signed)
[name] returns to clinic today for followup evaluation.  I first and  last saw the patient in this office on January 03, 2008, on referral from  Dr. Jordan [name].  The patient is a 40 year old male with a history of 3 lumbar  surgeries.  The last having been done on March 02, 2007, when he  underwent T11-T12 transpedicular microdiscectomy performed by Dr. Jordan [name].  He has had persistent back pain for several years now.  When we saw him  in July, we had started him on hydrocodone 10/325 approximately 5 per  day.  He reports that he has had some increased pain recently as he was  doing some extra work around the home and had increased pain such that  he had to increase it up to approximately 6-7 tablets per day.  He still  reports that there is some other stress in his life, and he needs to get  that under control to alleviate some of this back pain.  He does not  feel that he is getting much relief from the [facility] medication and feels  that he gets some diarrhea related to that.  He would like to switch to  possibly Flexeril medication.   We did discuss the positive urine drug screen for marijuana obtained  during the first clinic visit.  He explained that he used that just once  as his sister-in-law suggested to him, but he gained no relief and  stopped using the marijuana completely.  He also reports that his urine  drug screen was negative for amphetamines despite listing Adderall.  He  apparently was without the medication from 4 days prior to the urine  drug screen but now is back on that medication.  His other medicines  have been unchanged.   MEDICATIONS:  1. Norvasc 10 mg daily.  2. Atenolol 25 mg daily.  3. Hydrochlorothiazide 25 mg daily.  4. Zoloft 150 mg 1-1/2 tablets p.o. daily.  5. Klonopin 1 mg 3-4 times per day p.r.n.  6. Hydrocodone 10/325 one tablet 5-6 times per day.  7. Soma 350 mg t.i.d. p.r.n.  8. Adderall 20 mg t.i.d.   REVIEW OF SYSTEMS:  Noncontributory.   PHYSICAL  EXAMINATION:  GENERAL:  Well-appearing, moderately overweight  adult male, in moderate acute discomfort.  VITAL SIGNS:  Blood pressure 146/85 with a pulse of 93, respiratory rate  18, and O2 saturation 97% on room air.  He denies any bowel or bladder  incontinence.  MUSCULOSKELETAL:  He has strength of 4-/5 throughout the bilateral lower  extremities and 4+/5 throughout the bilateral upper extremities.   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   In the office today, we did compromise and decide to allow him to use  the hydrocodone 6 times per day instead of 5.  His present supply will  rut out approximately on March 16, 2008, and a new script was given  for that date at a frequency of 6 per day.  We will also start him on  Flexeril 10 mg 1-2 tablets p.o. nightly for spasms associated with  insomnia.  We will plan on seeing the patient in followup in  approximately 2 months' time with refills prior to that appointment as  necessary.  He understands that he needs to comply with our restrictions  regarding use of the  medication and limited only to the amount prescribed.  He also  understands that he is not to use any illicit drugs such as marijuana  that he had used once in the past.           ______________________________  Ellwood Dense, M.D.     DC/MedQ  D:  02/29/2008 10:28:55  T:  03/01/2008 01:57:30  Job #:  [id number]
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
## 2010-11-09 NOTE — Assessment & Plan Note (Signed)
[name] returns to the clinic today for followup evaluation.  He  reports that overall, he is doing fairly well.  He is using his  hydrocodone approximately 3 to 6-1/2 tablets per day.  He does get  reasonable relief overall.  He is also using Soma approximately twice a  day and he has refills on that in the office today.   The patient tries to be as active around the home.  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   MEDICATIONS:  1. Norvasc 10 mg daily.  2. Atenolol 25 mg daily.  3. Hydrochlorothiazide 25 mg daily.  4. Zoloft 150 mg 1-1/2 tablets p.o. daily.  5. Klonopin 1 mg q.i.d. p.r.n.  6. Hydrocodone 10/325 one tablet 5-6 times per day.  7. Flexeril 10 mg 1 tablet b.i.d. p.r.n.  8. Adderall 20 mg t.i.d.   REVIEW OF SYSTEMS:  Noncontributory.   PHYSICAL EXAMINATION:  GENERAL:  Well-appearing, moderately overweight  adult male in mild acute discomfort.  VITAL SIGNS:  Blood pressure 150/96 with a pulse of 103 and O2  saturation 96% on room air.  EXTREMITIES:  The patient has 4+/5 strength throughout the bilateral  upper and lower extremities.  He has decreased lumbar range of motion  especially in flexion and extension.   DIAGNOSIS:  Post-laminectomy syndrome after multiple lumbar surgeries.   In the office today, no refill on medication is necessary.  He is doing  well overall.  He continues to take the medication as prescribed without  signs of diversion or significant side effects.  We will plan on seeing  the patient in followup in approximately 4 months' time.           ______________________________  [name], M.D.     DC/MedQ  D:  04/25/2008 10:17:21  T:  04/25/2008 16:10:96  Job #:  [id number]

## 2010-11-09 NOTE — Op Note (Signed)
NAMETAGGERT, BOZZI                ACCOUNT NO.:  [id number]   MEDICAL RECORD NO.:  [id number]          PATIENT TYPE:  AMB   LOCATION:  SDS                          FACILITY:  [facility]   PHYSICIAN:  Henry A. Pool, M.D.    DATE OF BIRTH:  1971/06/09   DATE OF PROCEDURE:  03/02/2007  DATE OF DISCHARGE:                               OPERATIVE REPORT   SERVICE:  Neurosurgery.   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   PROCEDURE:  Left T11-12 transpedicular microdiskectomy.   SURGEON:  Kathaleen Maser. Pool, MD   ASSISTANT:  Tia Alert, MD   ANESTHESIA:  General orotracheal.   PREMEDICATION:  [name] is a 40 year old male with a history of back  and bilateral lower extremity pains and evidence on exam of a thoracic  myelopathy consistent with a T12 level.  Workup demonstrates evidence of  a significant leftward T11-12 disk herniation with compression of the  spinal cord.  The patient presents now for left-sided transpedicular  microdiskectomy in hopes of improving his symptoms.   OPERATIVE NOTE:  The patient was brought to the operating room and  placed on the table in a supine position.  After adequate level of  anesthesia was achieved, the patient was positioned prone onto a Wilson  frame and appropriately padded.  The patient's lumbar region was prepped  and draped sterilely.  A 10 blade was used make a linear skin incision  overlying the T11-12 level.  This was carried down sharply in the  midline.  Subperiosteal dissection was performed, exposing the lamina  and facet joints at T11 and T12.  A deep self-retaining retractor was  placed.  Intraoperative x-rays were taken and level was confirmed.  The  laminotomy was then performed using a high-speed drill and Kerrison  rongeurs to remove the inferior aspect of the lamina of T11, medial  aspect of the  T11-12 facet joint and the superior rim of the T12 lamina.  Microscope was then brought into the field and the ligamentum flavum was  then elevated and resected.  Protecting the thecal sac, the T12 pedicle  was identified and drilled down flush to the level of the T12 body; this  enabled access to the lateral space where the disk herniation was  compressing the thecal sac and spinal cord.  Epidural venous plexus was  coagulated and cut.  Disk herniation was then incised with a 15 blade in  a rectangular fashion and a wide disk space clean-out was then achieved  using pituitary rongeurs and Epstein curettes.  All elements of the disk  herniation were completely resected.  All loose of obviously  degenerative disk material was removed from the interspace.  At this  point, a very thorough diskectomy had been achieved.  There was no  evidence of injury to the thecal sac or nerve roots.  Wound was then  irrigated with antibiotic solution.  Gelfoam was placed topically for  hemostasis and found to be good.  Microscope  and retractor  system were removed.  Hemostasis in the muscle was achieved with  electrocautery.  Wound was then closed in layers with Vicryl sutures.  Steri-Strips and sterile dressings were applied.  There were no apparent  intraoperative complications.  The patient tolerated the procedure well  and he returns to the recovery room postoperatively.           ______________________________  Kathaleen Maser Pool, M.D.     HAP/MEDQ  D:  03/02/2007  T:  03/03/2007  Job:  [id number]

## 2010-11-10 ENCOUNTER — Other Ambulatory Visit (HOSPITAL_COMMUNITY): Payer: 59 | Admitting: Psychiatry

## 2010-11-12 ENCOUNTER — Other Ambulatory Visit (HOSPITAL_COMMUNITY): Payer: 59 | Admitting: Psychiatry

## 2010-11-12 NOTE — Assessment & Plan Note (Signed)
REFERRING PHYSICIAN:  Sharolyn Douglas, M.D.   [name] returns to clinic today for follow-up evaluation.  He has seen  Dr. Danielle Dess Nov 12, 2003, for neurosurgical evaluation.  At that time, the  patient had a history of the arthrodesis at L5-S1 that was performed March  2004.  Dr. Danielle Dess was apparently concerned about the stability of that  arthrodesis and has requested a myelogram and post myelogram CT.  He also  suggested that he may want to look at the discogram previously performed by  Dr. Ethelene Hal.  They will be reviewing those studies and following up with the  patient.  This still may be a possibility of doing surgical intervention for  the patient.   In terms of his pain medication, he reports that he is not getting much  relief with the Vicodin and is concerned about the extra Tylenol.  He would  like to return to the oxycodone.  We have also have had him on MS Contin  without significant benefit.  He also would like a refill on his Skelaxin  medication.   The patient is not working at the present time and is on short-term  disability with a plan for long-term disability.  He plans to return to  school for possibly computer technology either at [employer] or at [employer].  He  understands that he will probably not be doing any heavy lifting in the  future with or without further surgery.   MEDICATIONS:  1. Vicodin 10/660 one tablet p.o. q.i.d. p.r.n.  2. Accupril 20 mg daily.  3. Toprol XL 100 mg daily.  4. Robaxin 500 mg q.8h.  5. Zoloft 100 mg one to one and a half tablets daily.   PHYSICAL EXAMINATION:  GENERAL APPEARANCE:  A well-appearing, well-nourished  adult male.  VITAL SIGNS:  Blood pressure 144/87 with pulse of 100, O2 saturation 96% on  room air.  NEUROLOGIC:  He has 5-/5 strength throughout the bilateral upper and lower  extremities.  Bulk and tone were normal and reflexes were 2+ and  symmetrical.  The patient has significant decreased lumbar range of motion  in all  planes.  He ambulates without any assistive device.   IMPRESSION:  1. Post laminectomy/fusion syndrome (722.83).  2. Moderate lumbar degenerative disc disease per study November 2004.   In the clinic today I did give him a refill on his Skelaxin 400 mg one to  two tablets p.o. t.i.d. p.r.n.  I have also restarted him on oxycodone 5 mg  one to two tablets p.o. q.i.d. p.r.n.  He reports that he will try to take  that only two to three times per day.  I have also given him an excuse for  work to continue out of work at this point.  We will plan  on seeing him in follow-up in approximately one month's time.  If he has any  scheduled surgery with Dr. Danielle Dess, then certainly that appointment will need  to be rescheduled.      Ellwood Dense, M.D.   DC/MedQ  D:  11/20/2003 11:39:03  T:  11/20/2003 12:29:29  Job #:  [id number]   cc:   Sharolyn Douglas, M.D.  [address]  [place]  [place] [address]  Fax: [phone]

## 2010-11-12 NOTE — Procedures (Signed)
NAME:  [name], [name]                          ACCOUNT NO.:  [id number]   MEDICAL RECORD NO.:  [id number]                   PATIENT TYPE:  REC   LOCATION:  [facility]                                 FACILITY:  [facility]   PHYSICIAN:  Erick Colace, M.D.           DATE OF BIRTH:  1970/07/07   DATE OF PROCEDURE:  08/28/2003  DATE OF DISCHARGE:                                 OPERATIVE REPORT   PROCEDURE:  Left S1 transforaminal epidural injection elected after  equivocal response from L4 injection on August 07, 2003.   INDICATIONS FOR PROCEDURE:  Left lower extremity radicular pain and history  of L5-S1 fusion.  He underwent a left L4 transforaminal epidural steroid  injection on August 07, 2003.  He had some post injection pain one day  after the injection at the needle insertion site.  Then he had improvement  in pain that he rated at 20 to 40% during the weekend, but once he got back  to work he states it was as if he had never had the injection.   The patient's pain goes down from his buttock down to the posterior aspect  of his leg and into his left foot.   DESCRIPTION OF PROCEDURE:  Informed consent was obtained after describing  the risks and benefits of the procedure with the patient.  These risks  included bleeding, bruising, infection, reaction to injected medications  including paralysis and loss of bowel and bladder function.  The patient  wished to proceed and signed an informed written consent.   The patient was placed prone on the fluoroscopy table with Betadine prep and  sterile drape.  A 1 1/4 inch, 25 gauge needle was used to anesthetize the  skin and subcutaneous tissues with 1% lidocaine times 2 mL and a 22 gauge,  3.5 inch spinal needle was utilized to perform the injection under  fluoroscopic guidance.  Multiple views were obtained and the appropriate  location was confirmed.  The initial injection of Omnipaque demonstrated  some intravascular uptake, but after  needle repositioning demonstrated  epidural spread with a solution containing 1 mL of 40 mg per mL of Kenalog  and 2 mL of 1% epinephrine free lidocaine were injected.  This was through  the left S1 foramen.  The patient tolerated the procedure well.  Post  injection instructions were given.  Pre-injection pain level was at 7 to 8  out of 10 and post-injection was 5.  The patient tolerated the procedure  well and will follow-up in three weeks for  possible re-injection and depending on results either an L4-S1  transforaminal versus an L4-5 non-selective translaminar.                                                Dean Sparrow  Wynn [name], M.D.    AEK/MEDQ  D:  08/28/2003 14:54:00  T:  08/28/2003 16:22:12  Job:  [id number]   cc:   Ellwood Dense, M.D.  [address] [place]  [place] [address]  Fax: [phone]   Sharolyn Douglas, M.D.  [address]  [place]  [place] [address]  Fax: [phone]

## 2010-11-12 NOTE — H&P (Signed)
NAME:  [name], [name]                          ACCOUNT NO.:  [id number]   MEDICAL RECORD NO.:  [id number]                   PATIENT TYPE:  INP   LOCATION:                                       FACILITY:  [facility]   PHYSICIAN:  Sharolyn Douglas, M.D.                     DATE OF BIRTH:  [birth year]   DATE OF ADMISSION:  09/11/2002  DATE OF DISCHARGE:                                HISTORY & PHYSICAL   CHIEF COMPLAINT:  Back pain.   HISTORY OF PRESENT ILLNESS:  The patient is a 40 year old male with severe  back pain and degenerative disk disease who has failed conservative  management including anti-inflammatory medications, pain medications,  activity modification, physical therapy and injections.  It has gotten to  this point as constant in nature and interfering with activities of daily  living and quality of life.  He has been unable to work since 12/03.  He is  on escalating doses of narcotics in order to control his pain.   ALLERGIES:  PENICILLIN.   MEDICATIONS:  1. Oxycodone.  2. Muscle relaxer.  He is unsure of the name.  3. Toprol XL 100 mg p.o. daily.  4. Accupril 20 mg p.o. daily.  5. Zoloft 100 mg p.o. daily.   PAST MEDICAL HISTORY:  Recovering alcoholic and hypertension.   PAST SURGICAL HISTORY:  None.   SOCIAL HISTORY:  The patient denies tobacco use, denies alcohol use.  He is  married.  He has been out of work since 06/14/02.  His wife and mother will  be available to help him through his postoperative course.   FAMILY HISTORY:  Noncontributory.   REVIEW OF SYSTEMS:  The patient denies fevers, chills, sweats or bleeding  tendency.  Denies blurred vision, double vision, seizures, headache,  paralysis. CARDIOVASCULAR:  Denies chest pain, angina, orthopnea,  claudication or palpitations.  PULMONARY:  Denies shortness of breath,  productive cough or hemoptysis.  GI:  Denies nausea, vomiting, constipation,  diarrhea, melena, bloody stool.  GU:  Denies dysuria, hematuria,  discharge.  MUSCULOSKELETAL:  Denies paresthesias, numbness.   PHYSICAL EXAMINATION:  VITAL SIGNS:  Blood pressure 140/92, respirations 16  and unlabored, pulse is 80 and regular.  GENERAL:  The patient is a 40 year old white male who is alert and oriented  in no acute distress.  He is well-nourished, well-developed appearing stated  age.  Pleasant and cooperative to examination.  HEENT:  Head normocephalic, atraumatic.  Pupils equal, round and reactive.  Extraocular  movements intact. Nares patent.  Pharynx clear.  NECK:  Soft to palpation.  No lymphadenopathy or thyromegaly or bruits  appreciated.  CHEST:  Clear to auscultation bilaterally.  No rales, rhonchi, stridor or  wheezes.  BREASTS:  Not pertinent.  Not performed.  HEART:  S1, S2.  Regular  rate and rhythm. No murmurs, gallops or rubs  noted.  ABDOMEN:  Soft to palpation.  Positive bowel sounds.  Nontender,  nondistended.  No organomegaly noted.  GU:  Not pertinent.  Not performed.  EXTREMITIES:  Motor function and sensation are grossly intact.  Pulses are  intact and symmetric.  SKIN:  Intact without any lesions or rashes.   Diskogram is positive for pain at L5-S1.  Degenerative disk disease per MRI.   IMPRESSION:  1. Degenerative disk disease and severe pain.  2. Hypertension.   PLAN:  Admit to [facility] on 09/11/2002. for L5-S1 diskectomy,  minimally invasive posterior spinal fusion and __________ procedure to be  done by Dr. Sharolyn Douglas.   PRIMARY CARE PHYSICIAN:  Quita Skye. Artis Flock, M.D.      Verlin Fester, P.A.                       Sharolyn Douglas, M.D.    CM/MEDQ  D:  09/11/2002  T:  09/11/2002  Job:  [id number]

## 2010-11-12 NOTE — Procedures (Signed)
NAME:  [name], [name]                          ACCOUNT NO.:  [id number]   MEDICAL RECORD NO.:  [id number]                   PATIENT TYPE:  REC   LOCATION:  [facility]                                 FACILITY:  [facility]   PHYSICIAN:  Erick Colace, M.D.           DATE OF BIRTH:  1970/09/09   DATE OF PROCEDURE:  08/07/2003  DATE OF DISCHARGE:                                 OPERATIVE REPORT   PROCEDURE:  Left L4 transforaminal epidural steroid injection.   INDICATIONS FOR PROCEDURE:  The patient is referred for a left L4  transforaminal epidural steroid injection.  He has a history of an L5-S1  fusion.  He has left lower extremity much greater than right lower extremity  pain plus back pain.  Pre-injection pain level 5/10.   DESCRIPTION OF PROCEDURE:  Informed consent was obtained after describing  the risks and benefits of the procedure to the patient.  These risks  included bleeding, bruising, infection, reaction to injected medications and  including paralysis with loss of bowel and bladder function.  The patient  wishes to proceed.   The patient was placed prone on the fluoroscopy table with Betadine prep and  sterile drape.  A 1 1/4, 25 gauge needle was used to anesthetize the skin  and subcutaneous tissues with 1% lidocaine times 3 mL and a 22 gauge, 3.5  inch spinal needle was utilized to perform the injection under fluoroscopic  guidance.  Multiple views were obtained and appropriate location confirmed  by injecting Omnipaque 180 times a total of 1 mL.  The initial pass showed  just nerve root spread and after needle adjustment showed epidural spread.  Then a solution containing 1 mL of 40 mg per mL Kenalog and 2 mL of 1%  methylparaben free lidocaine were injected.  The patient tolerated the  procedure well.  Post injection instructions were given.  Post injection  pain level is 3/10.  He will follow-up in three weeks.  Should he have  inadequate pain relief, would then try a  left S1 transforaminal.                                                Erick Colace, M.D.    AEK/MEDQ  D:  08/07/2003 15:11:38  T:  08/07/2003 16:03:09  Job:  [id number]   cc:   Sharolyn Douglas, M.D.  [address]  [place]  [place] [address]  Fax: [phone]

## 2010-11-12 NOTE — Op Note (Signed)
NAMEMATAS, BURROWS                ACCOUNT NO.:  [id number]   MEDICAL RECORD NO.:  [id number]          PATIENT TYPE:  INP   LOCATION:  3033                         FACILITY:  [facility]   PHYSICIAN:  Stefani Dama, M.D.  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   PROCEDURE:  Revision of pseudoarthrosis L5-S1 with posterior interbody  arthrodesis and pedicle screw fixation, posterolateral arthrodesis with  local autograft and allograft, Infuse.   SURGEON:  Dr. Danielle Dess.   FIRST ASSISTANT:  Dr. Shirlean Kelly.   ANESTHESIA:  General endotracheal.   INDICATIONS:  [name] is a 40 year old individual who about 3 years ago  underwent decompression arthrodesis at the level of L5-S1.  Procedure was  performed by Dr. Sharolyn Douglas doing a minimally invasive approach. The patient  subsequently developed a disk rupture at the level of L4-L5 and about a year  ago I revised his surgery doing a diskectomy L4-5, removing hardware at the  L5-S1 level and it was apparent at that time that he had a solid  arthrodesis. He underwent pedicle fixation at L4-5. Subsequently, the  patient started developing increasing back pain and a follow-up studies  suggests now that he has a pseudoarthrosis at the L5-S1 level.  He is now  taken back to the operating room to undergo surgical revision of that lower  level.   PROCEDURE:  The patient was brought to the operating room supine on the  stretcher.  After smooth induction of general endotracheal anesthesia he was  turned prone, back was prepped with DuraPrep and draped in sterile fashion.  An elliptical incision was made around his previous midline scar and the  scar was excised. Dissection was carried down  to lumbodorsal fascia.  On  either side of the fascia was opened up to expose the spinous process of L5,  the spinous processes of the sacrum.  L4 had largely been removed.  Dissection was carried out laterally to expose the hardware, at the L4-L5  interval.  The interlaminar space of L5 andS1 was exposed and explored.  By  applying a towel hook to the spinous process of L5 without too much  difficulty, a rocking motion could be induced at L5 and S1.  The  interlaminar space was then cleared on the first the left side and removal  of the inferior margin of the lamina and the entirety of the facette of L5-  S1 was accomplished. This allowed access to the lateral dura and the disk  space itself. The minimally invasive approach for his previous posterior  interbody arthrodesis had been undertaken through the patient's right side  and on the left side disk space was easily accessible.  The 15 blade was  used to open the disk space and significant quantity of disk material was  removed from within the disk space.  As  the disk space was evacuated, there  was noted to be the bone graft that was impacted from the opposite side with  some dissection.  Bone graft was noted to be loose and free floating.  It  could gradually be mobilized and with some distraction being applied on the  contralateral interlaminar space, the bone graft itself was removed from the  disk space. Attention was then turned to the right side and laminotomy and  complete facetectomy was performed here. The disk space itself was  identified. There was noted to be some bowing under the S1 nerve root from  the previous approach. Scar tissue in this area was released to untether the  path of the S1 nerve root. The disk space itself was then entered and  completion of the diskectomy was then performed removing some bits of debris  and associated with the previously infected bone graft along with a  considerable amount of disk  material. The disk space was completely  evacuated both medially and laterally on both sides. Curettes for endplate  preparation were used to remove the cartilaginous endplates which were  largely still present. Once these were removed, this interspace was sized  and it was felt that a 10 mm PEEK bone spacer would fit well into the  interval that was created.  During this time Infuse was prepared. The bone  that was harvested from the laminectomy and facetectomy was cut into bits  and mixed with some alpha grain for a total of 10 mL.  This was then packed  into the PEEK bone spacers and also packed into the interspace along with a  strip of Infuse.  The cages were then placed at L5-S1 and the disk space was  filled with the bony mixture of alpha grain and autograft. Once this was  accomplished pedicle entry sites were chosen in the sacrum. The previous  screw caps were released from L4-L5. Rods were removed and with pedicle  probes and the sacral pain pedicles radiographic visualization with  fluoroscopy was obtained both the AP and lateral projections, make sure that  the trajectory of screws was good. Bicortical purchase through the sacrum  was obtained with 50 mm screws being placed into the sacrum. These were 7.5  diameter screws. Care was taken to make sure that there was no cutout of the  sacral pedicles and the screw holes could be sounded thoroughly without  noting any evidence of cutout. Then with the screws being placed, a 50 mm  rod was placed on the right side, a 60 mm rod was required on the left side.  The screw caps were tightened down superiorly and inferiorly, compression  was applied to the construct at the L5-S1 level so as to induce further  lordosis.  The system was then tightened into final locking position.  Prior  to this the lateral gutters were decorticated and this area was packed with  the remainder of the Infuse and the autograft, allograft mixture. Once  the system was completely tightened down, care was taken to make sure that the  L5 and the S1 nerve roots remained well decompressed. Hemostasis in the soft  tissue was obtained meticulously.  The wound was irrigated copiously with  antibiotic irrigating solution. The lumbodorsal fascia was closed #1 Vicryl  interrupted fashion, 2-0 Vicryl was used in subcutaneous tissues, 3-0 Vicryl  subcuticularly. Dermabond was used on the skin. Blood loss estimated at 500  mL. The patient tolerated procedure well.  Stefani Dama, M.D.  Electronically Signed     HJE/MEDQ  D:  08/04/2005  T:  08/05/2005  Job:  [id number]

## 2010-11-12 NOTE — Assessment & Plan Note (Signed)
HISTORY:  The patient returns to the clinic today for a follow-up  evaluation.  He reports that he still has fairly significant low back pain.  He is taking his oxycodone, one to two tab approximately q.6-8h.  He usually  uses six to eight per day, but has run as many as 10 per day.  He is also  taking his Robaxin 500 mg, one tab q.8h. p.r.n.  He generally takes  approximately three per day.  He additionally uses a TENS unit and uses a heating pad on his low back.  He  has been discharged from outpatient physical therapy, but is starting a  walking program on his own along with a pool program.  He did not gain much  benefit from the LAND therapy.  The patient is on short-term disability from his employer at the present  time.  He has contacted an attorney, secondary to the fall that he  experienced down at a hotel in [place] at the time of his cruise.  He had  done well after his September 11, 2002, surgery with Dr. Sharolyn Douglas, until his  all occurred in [place].  He is still in the process of looking into details  regarding that situation.  The patient does report a fair amount of relief with his oxycodone  medication.  He has undergone two epidural steroid injections.  He reports  that the last injection did give him some help with pain below his knee.  He  does still have pain in his lumbar region, along with pain in his tailbone,  and into his buttock, along with some milder pain into his left lateral  thigh.  He really has no pain below his knee level on the left side.   MEDICATIONS:  1. Oxycodone 5 mg, one to two tab q.6-8h. p.r.n.  2. Accupril 20 mg daily.  3. Toprol 100 mg daily.  4. Robaxin 500 mg q.8h. p.r.n.  5. Zoloft 100 mg, 1-1/2 tab daily.   PHYSICAL EXAMINATION:  GENERAL:  A well-appearing, well-nourished adult  male, in mild acute discomfort.  VITAL SIGNS:  Blood pressure 139/90, pulse 98, respirations 16, O2  saturation 97% on room air.  NEUROLOGIC:  The patient has  5-/5 strength throughout the bilateral lower  extremities.  Bulk and tone were normal.  Reflexes were 2+ and symmetrical.   IMPRESSION:  1. 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  2. Moderate lumbar degenerative disk disease, primary study in November     2004.   DISPOSITION:  In the office today I did give him a follow-up excuse from  work through at least his follow-up appointment with Dr. Stefani Dama,  which is scheduled for Oct 29, 2003.  We will see what Dr. Danielle Dess feels about  a second surgery.  In the meantime I have refilled his oxycodone and Robaxin  medications as noted above.  I have encouraged him to be as active as  possible, especially with the water therapy and land walking.  He will  continue to use the TENS unit along with a heating pad to his lumbar region.   FOLLOWUP:  I will plan on seeing the patient in followup in mid-May, after  he has had a chance to discuss the situation with Dr. Danielle Dess.      Ellwood Dense, M.D.   DC/MedQ  D:  09/22/2003 12:30:00  T:  09/22/2003 13:29:19  Job #:  [id number]

## 2010-11-12 NOTE — Group Therapy Note (Signed)
REFERRAL:  Dr. Sharolyn Douglas.   PURPOSE OF EVALUATION:  Evaluate and treat chronic low back pain, status  post fusion.   HISTORY OF THE PRESENT ILLNESS:  [name] is a 40 year old adult male  referred to this office by Dr. Noel Gerold, a local orthopedist, for evaluation  and treatment of chronic low back pain.   The patient reports that he really suffered no particular injury, but had  progressive low back and leg pain over the past 2 to 3 years.  He does have  a history of heavy lifting at work and reports that that may have been in  part responsible for the onset of the pain.   The patient reports having seen Dr. Caralyn Guile. Ramos since approximately  July of 2003 and then having also seen Dr. Fayrene Fearing [place] in the same  office.  The patient reports having tried Celebrex initially in the past,  200 mg b.i.d.  Dr. Simonne Come, in August of 2003, reportedly recommended no  surgery for this individual.   March 01, 2002, the patient reportedly underwent left S1 selective nerve  root block.  March 29, 2002, he reportedly underwent left S1 selective  nerve root block again.  There may have been a bilateral L5-S1 facet  injection also done.  In any event, the patient reports that he had a total  of 3 injections and they helped with his pain in his leg but not much in  terms of relief of his low back pain.   September 11, 2002, the patient was taken to the operating room and underwent  left L5-S1 laminectomy and decompression on the left S1 nerve root.  There  was also a transforaminal lumbar interbody fusion done at L5-S1.   The patient reports that he did extremely well until about August of 2004,  while he was on his honeymoon on a cruise.  Apparently, he fell down some  steps at that time and had immediate onset of increased pain.   April 01, 2003, the patient saw Dr. Noel Gerold in followup and he was now 6  months from his lumbar fusion.  He was asked to continue physical therapy at  that  time.  He did note the fall that occurred while he was on his  honeymoon.  He was to begin Bextra daily.  He was continue a 40-pound  lifting restriction at that time.   May 12, 2003, the patient saw Dr. Noel Gerold in followup, now 7 months out  from his surgery.  He was getting increased pain and they requested an MRI  scan of his lumbar spine.  He was to start physical therapy but never did,  as he was experiencing such pain, he felt that he could not do any therapy  at that time.  He was prescribed Vicodin 5/500 mg 1 tablet b.i.d. p.r.n. and  Oxycet 1 to 2 tablets q.8 h. p.r.n.   May 20, 2003, an MRI scan of his lumbar spine showed expected  postoperative changes at L5-S1; chronic left posterolateral disk herniation  at L1-2, unchanged; disk bulges at L2-3 and L3-4, unchanged; disk bulge at  L4-5.  There was no dramatic change since prior studies done on August 15, 2002 and January 05, 2002.   May 26, 2003, the patient saw Dr. Noel Gerold in followup.  At that time, the  MRI scan was reviewed.  He was doing minimally better at that time.  He was  taking approximately 4 Vicodin a day, along with  Bextra.  He was to continue  light-duty work full-time and was referred here for chronic pain management.   The patient reports that he is due to follow up with Dr. Noel Gerold tomorrow in  that office.   The patient reports that presently he has pain that is significantly present  in his low back and feels as if he wakes in the morning with complete pain  throughout his body.  He reports that his left leg throbs into his left  calf, the top of his left foot, left ankle and left knee, along with the  outer thigh on the left side.  He reports that after he showers the first  thing in the morning and takes pain medicine, he does get some relief and is  able to go to work.  Prior to the fall, he was having no pain whatsoever and  he usually took a Vicodin at night and woke up feeling extremely  well in the  morning.  As noted above, he has had minimal pain until his fall after his  lumbar fusion.  Followup MRI scans have been done, as noted above.   The patient does report that he started on Lexapro for anxiety.   The patient had been using Celebrex but secondary to media information, he  became worried and he has stopped that medication completely.   MEDICATIONS:  1. Accupril 20 mg daily.  2. Toprol 100 mg daily.  3. Norco 10/325 mg 4 to 6 tablets daily.  4. Lexapro 50 mg daily.   PAST MEDICAL HISTORY:  1. Hypertension.  2. Mild obesity.  3. Anxiety related to prior divorce.  4. History of back problems since age 15.   ALLERGIES:  PENICILLIN.   SOCIAL HISTORY:  The patient was previously divorced in 1995 but has  remarried.  He has 1 biological daughter and 1 stepdaughter at the present  time.  He works for [employer].  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  He does work an inside Airline pilot job but does have a fair  amount of lifting, lifting up to 50 pounds several times through the day.  He generally has minimal help with the lifting and is more or less required  to do that secondary to lack of help.  His work restriction was 40 pounds  and, in fact, his employer had allowed him to do less lifting.  Unfortunately, the extra help is not available when it is necessary.  The  patient reports that he does not smoke tobacco and admits to occasional beer  use.   PHYSICAL EXAMINATION:  GENERAL:  Well-appearing, slightly to moderately  overweight adult male.  VITAL SIGNS:  Blood pressure 162/107 with a pulse of 94 and respiratory rate  16, with O2 saturation 98% on room air.  NEUROMUSCULAR:  He has a normal ability to ambulate without any assistive  device.  Bilateral upper extremity range of motion was within functional  limits with no particular restrictions.  He did have complaints of slight  low back pain with shoulder flexion and  abduction.  Manual muscle testing of his bilateral upper extremities showed 5/5 strength  throughout.  Bulk and tone were normal and reflexes were 2+ and symmetrical.  Sensation was intact to light touch throughout the bilateral upper  extremities.   Lower extremity exam showed 5/5 strength throughout the right lower  extremity and 5-/5 strength throughout the left lower extremity in terms of  hip flexion, knee  extension and ankle dorsiflexion. There was no numbness  reported on light sensation testing throughout the bilateral lower  extremities.  Reflexes were 1 to 2+ at the bilateral knees and ankles.   In a standing position, lumbar range of motion was extremely limited in  terms of lateral bending and rotation.  He has a fairly normal flexion to  approximately 60 degrees.   In the supine position, straight leg raise was positive bilaterally.  The  patient had poor hip range of motion bilaterally with complaints of low back  pain.  He was extremely sensitive to light touch throughout his entire  lumbar region.   IMPRESSION:  1. 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  2. Moderate lumbar degenerative disk disease per followup MRI study,     November 2004.   At the present time, we have discussed the fact that he gained some relief  with lumbar injections in the past.  We have set him up for lumbar epidural  steroid injections, probably x3, over the next several weeks.  I will plan  on seeing him in followup in approximately 1 month's time, but that will  probably be during his series of injections.  I have given him a  prescription for Norco 10/325 mg 1 to 2 tablets p.o. t.i.d. p.r.n. for pain  that he still is experiencing and will likely experience for some time.  I  have asked him to attend outpatient physical therapy and I have given a  prescription for that lumbar range of motion and strengthening program.  Hopefully, that can be set up at [facility] office through [facility], where they do have a physical therapist.  He will need instruction  in the exercises,but then should be compliant with that home exercise  program.  He is extremely stiff throughout his entire lumbar spine at this  point.  Unfortunately, he did not attend physical therapy when recommended  to him from Dr. Noel Gerold postoperatively.   We did discuss also his work situation at this time.  He is anxious about  that as he is fearful that they will close out his position that has been  made for him secondary to his limited work ability.  He is still considering  returning to short-term disability, which he was on previously, before he  returned to work.  There is not a worker's compensation claim on this injury  at the present time.  The patient is trying to make a decision in terms of  the short-term disability and possible future education to avoid the manual  work in the future.   I will plan on seeing him in followup as noted above in 1 month's time.     Ellwood Dense, M.D.  DC/MedQ  D:  07/24/2003 12:23:08  T:  07/24/2003 15:37:15  Job #:  [id number]   cc:   Sharolyn Douglas, M.D.  [address]  [place]  [place] [address]  Fax: [phone]

## 2010-11-12 NOTE — Assessment & Plan Note (Signed)
[name] returns to clinic today for followup evaluation.  He reports that  Dr. Danielle Dess is planning double-level fusion at L4-5 and a refusion at L5-S1  scheduled for January 29, 2004.  The patient has also been identified with a  pars defect.  Dr. Danielle Dess also feels that some of the screws could be better  placed for improved fusion.  The patient himself continues on MS Contin 15  mg b.i.d.  That is giving him much better relief than the Vicodin used  previously.  He is not using much oxycodone at the present time.  He  continues to use the Skelaxin three times a day.  He is on short-term  disability another five weeks and then the plan is to move to long-term  disability.   MEDICATIONS:  1. MS Contin 15 mg b.i.d.  2. Skelaxin 400 mg one to two tablets t.i.d.  3. Zoloft 100 mg daily.  4. Accupril 20 mg daily.   PHYSICAL EXAMINATION:  A well-appearing, well-nourished, adult male.  Blood  pressure of 143/93 with a pulse of 68, respiratory rate 20, and O2  saturation 98% on room air.  He has 5-/5 strength throughout the bilateral  lower extremities and 5/5 strength throughout the bilateral upper  extremities.  Bulk and tone were normal.  Reflexes were 2+ and symmetrical.  Sensation was intact to light touch throughout.   IMPRESSION:  1. Post laminectomy/fusion syndrome with poor fusion (19147).  2. Moderate lumbar degenerative disk disease per study in November of 2004.   The patient is due for a repeat fusion at L5-S1 and a new fusion at L4-5,  all with Dr. Danielle Dess on January 29, 2004.  I will plan on seeing the patient in  followup in approximately two months time.  Will refill his MS Contin as  necessary prior to that appointment with the next refill probably over the  next 7-10 days.  I explained to the patient that whatever Dr. Verlee Rossetti  wishes to place him on for pain medications, we will be happy to continue  after discharge from the hospital.      Ellwood Dense, M.D.   DC/MedQ  D:  01/09/2004 09:43:10  T:  01/09/2004 10:21:37  Job #:  [id number]

## 2010-11-12 NOTE — Op Note (Signed)
[name], [name]                          ACCOUNT NO.:  [id number]   MEDICAL RECORD NO.:  [id number]                   PATIENT TYPE:  INP   LOCATION:  3034                                 FACILITY:  [facility]   PHYSICIAN:  Stefani Dama, M.D.               DATE OF BIRTH:  1971-06-13   DATE OF PROCEDURE:  01/29/2004  DATE OF DISCHARGE:                                 OPERATIVE REPORT   PREOPERATIVE DIAGNOSIS:  1. Pseudoarthrosis, L5-S1.  2. 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   PROCEDURE:  Removal of hardware L5-S1, exploration of arthrodesis, Gill  procedure L4, posterior lumbar interbody arthrodesis with peak bone cages,  nonsegmental fixation L4-5.  Posterior lateral arthrodesis with Allograft  with bone marrow aspirate.   SURGEON:  Stefani Dama, M.D.   FIRST ASSISTANT:  Hilda Lias, M.D.   ANESTHESIA:  General endotracheal.   INDICATIONS:  The patient is a 40 year old individual who has had  significant back and left lower extremity pain.  A little over one year ago  he underwent a decompression arthrodesis at the L5-S1 level.  He had a  minimally invasive technique.  He has had recurrence of pain in the left  lower extremity after an incident.   DESCRIPTION OF PROCEDURE:  The patient was brought to the operating room  supine on the stretcher.  After the smooth induction of general endotracheal  anesthesia he was turned prone.  The back was shaved, prepped with DuraPrep  and draped in the sterile fashion.  A midline incision was created and  carried down to the lumbar dorsal fascia which was opened on either side of  midline and the dissection was carried down in a subperiosteal fashion along  the laminar arch of L5 to expose the hardware in the lateral gutters.  Pedicle screw caps were identified at L5 and S1.   Dissection was then  carried cephalad to expose the L4 region.  It was noted that the L4 laminar  arch and spinous process complex was loose by comparison to anything around  it.  It moved freely, easily 8-10 mm, from side to side and up and down.  The laminar arch was then dissected free from all soft tissues and was  removed on block with pars defects being noted bilaterally at the L4 region.  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  The common dural tube was decompressed in this fashion and  the pedicle screw caps were identified at L5 and S1.  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  The common  dural tube was decompressed in this fashion, and the pedicle screw caps were  identified at L5 and S1.  These were removed with the tools  from [employer], central locking cap to allow removal of the rods and then the  pedicle screw remover was used to extract the pedicle screws from L5 and S1.  Because of the trajectory, this was somewhat difficult with the screw heads  being facing laterally, and our incision being made from the midline,  dissecting laterally.  In any event, the hardware was removed and with this  the exploration of the fusion was undertaken and it was noted that there was  no gross motion between L5 and the sacrum.  The region of the facets were  identified and these were pried upon, and there was no gross motion in the  region of the facets.  It was felt that the patient indeed did have a solid  arthrodesis.  Attention was then turned to the L4 region.  The area of the  disk space was uncovered and after cauterizing the soft tissues around this  area and being careful to protect the common dural tube and the nerve root,  the disk space was entered and diskectomy was undertaken.  This first  started on the left side.  Then it was performed on the right side.  The  disk was noted to be  substantially degenerated with bulging of the posterior  area of the disk but no gross herniation of the disk was noted.  Diskectomy  was then performed totally using a combination of curets and rongeurs to  remove the bulk of the disk material from within the disk space.  Then the  area of the posterior longitudinal ligament was explored and several  fragments of angular material were removed from this area.  The area  longitudinal ligament region was explored also.  The end plates were  rongeured smooth and a rasper was used to decorticate any remaining  fragments of disk material and end plate from the disk space.  Once this was  accomplished, the interspace was sized with the appropriate size spacer and  it was felt that was a 12 mm spacer would fit nicely into the space.  The  disk space was then packed with a combination of the patient's own bone and  some V-toss which was mixed with bone marrow aspirate obtained from tapping  of the L4 pedicle.  This was packed into the disk space and the cages were  then placed into the space itself.  Attention was then turned to L4 where  pedicle probes were placed into the pedicle and a localizing radiograph  identified good position of these probes.  The initial holes were then  tapped to 6.5 mm size and 6.5 x 40 mm screws were placed in L4, 6.5 x 40 mm  screws were placed in L5.  The superior portion of the construct was then  completed by placing short rods between L4 and L5.  The system was  ultimately tightened down in a parallel configuration.  However, first the  system was released from compression which seemed to provide hyperextension.  It was released.  The screw angles were reevalauted and it was then parallel  compressed so that adequate pressure was on the graft.  The remainder of the  graft was then packed into the lateral gutters.  Two Helios strips which  were soaked with the patient's bone marrow aspirate from the L4  pedicles were packed into the lateral gutters also.  In the end then, the lumbar  dorsal fascia was closed with #1 Vicryl in an interrupted  fashion, 2-0  Vicryl was used in the subcutaneous tissues and 3-0 Vicryl subcuticular.  The patient tolerated the procedure well and then was returned to the  recovery room.  Estimated blood loss was about 1 liter and was returned  about 350 mL.                                               Stefani Dama, M.D.    [name]  D:  01/29/2004  T:  01/30/2004  Job:  [id number]

## 2010-11-12 NOTE — Op Note (Signed)
NAME:  [name], [name]                          ACCOUNT NO.:  [id number]   MEDICAL RECORD NO.:  [id number]                   PATIENT TYPE:  INP   LOCATION:  5008                                 FACILITY:  [facility]   PHYSICIAN:  Sharolyn Douglas, M.D.                     DATE OF BIRTH:  04-21-1971   DATE OF PROCEDURE:  09/11/2002  DATE OF DISCHARGE:                                 OPERATIVE REPORT   DIAGNOSES:  1. Degenerative disk disease, L5-S1, with chronic back pain.  2. L5-S1 disk herniation with left leg pain.   PROCEDURES:  1. Left L5-S1 lumbar laminotomy and diskectomy with decompression of the     left L5 nerve root.  2. Transforaminal lumbar interbody fusion, L5-S1, with placement of a 12 mm     Nuvasive allograft prosthesis spacer.  3. Posterior spinal arthrodesis, L5-S1.  4. Pedicle screw instrumentation utilizing the Pathfinder system, L5-S1.  5. Local autogenous bone graft supplemented with AGF.  6. Neurologic monitoring utilizing the Neuro Vision system with monitoring     of free-running electromyography for three hours and triggered     electromyography of four pedicle screws.   SURGEON:  Sharolyn Douglas, M.D.   ASSISTANT:  Verlin Fester, P.A.   ANESTHESIA:  General endotracheal.   COMPLICATIONS:  None.   INDICATIONS:  The patient is a 40 year old male with chronic disabling low  back pain.  He has been treated conservatively by a physiatrist with pain  medications, epidural steroid injections, activity modification, physical  therapy, bracing, and a nucleoplasty procedure at L5-S1.  His plain  radiograph showed degenerative changes at L5-S1.  The MRI scan shows  degenerative disk disease at L5-S1 with a herniation on the left.  Discography showed concordant pain at L5-S1.  Note was made of a small  synovial cyst at L4-5 on the left.  It did not appear to cause any nerve  root impingement.  Because of his persistent symptoms unresponsive to  conservative care, he elected  to undergo minimally-invasive L5-S1 fusion  with pedicle screws and T-LIF procedure.  He is aware of risks of adjacent  segment degeneration, pseudoarthrosis, and nonbenefit.   DESCRIPTION OF PROCEDURE:  The patient was properly identified in the  holding area, taken to the operating room.  He underwent general  endotracheal anesthesia without difficulty.  He was given prophylactic IV  antibiotics.  EMG leads were placed for monitoring of free-running and  triggered EMGs utilizing the Neuro Vision neuro monitoring system.  He was  carefully turned prone onto the Acromed four-poster positioning frame.  All  bony prominences were padded.  Face and eyes were protected at all times.  The back was prepped and draped in the usual sterile fashion.  Biplanar  fluoroscopy was brought into the field.  The pedicles of L5 and S1 were  marked out on the skin.  Four-centimeter incisions  were made bilaterally  longitudinally between the pedicles of L5 and S1.  The deep fascia was  incised sharply.  We used successively larger dilators at L5-S1 on the left  side over the L5-S1 facet joint.  We then placed the Maxcess retractor and  expanded it, exposing the facet joint and L5 lamina.  We confirmed this  location using fluoroscopy.  We then performed a facetectomy on the left,  osteotomizing the pars of L5 and removing inferior facet.  The traversing  S12 nerve root was identified and gently retracted medially.  There was a  bulging disk that was displacing the S1 nerve root posteriorly.  A sharp  annulotomy was performed and lateral diskectomy was carried out all the way  across the contralateral side.  We used specialized instruments from the  [place] T-LIF set.  The end plates were carefully scraped and 150 mL of  blood was removed from the A-line and placed into the ______ AGF machine.  The growth factors were utilized to make a log of bone graft consisting of  50% local autogenous bone graft that was  collected from the facet and  laminotomy and 50% Pro-Osteon bone graft substitute.  We then packed the  bone graft material tightly into the disk space across to the other side.  It became evident that the disk was completely packed with bone graft.  We  then placed a Nuvasive 12 mm allograft prosthesis spacer through the  transforaminal window and kicked this across to the midline.  We used AP and  lateral fluoroscopy to confirm the central positioning.  We monitored free-  running EMGs throughout the T-LIF procedure.  There were no deleterious  changes.  All bleeding was controlled with bipolar electrocautery and  Gelfoam.  At this point we turned our attention to placing the pedicle  screws at L5-S1 using the Pathfinder system.  Jamshidi needles were used to  cannulate the pedicles of L5 and S1 using biplanar fluoroscopy.  Guidewires  were placed through the Jamshidi needles and cannulated, awl, tapped, and  then the pedicle screws were placed.  We used 45 x 6.5 mg screws at L5, 35 x  7.5 mm screws at S1.  After each guidewire was placed, we used the triggered  EMGs to stimulate the guidewire.  In each case we had a response at greater  than 14 milliamps, consistent with interosseous placement.  After tapping  each pedicle screw hole, the tap itself was electrified and again the  response was greater than 14 milliamps.  The screws were placed.  On the  right side we decorticated the facet capsule and joint.  The remaining AGF  and bone graft was packed into the facet joint, performing a posterior  fusion of the joint.  We then placed our rods and locking caps, causing  gentle compression across the L5-S1 segment.  We confirmed the central  positioning of the pedicle screws and rods on AP and lateral fluoroscopy and  saved images.  The wounds were irrigated.  The two small fascial incisions  were closed with #1 Vicryl suture.  Subcutaneous layer closed with 2-0 Vicryl and then the skin  closed with a running 3-0 subcuticular suture.  Benzoin and Steri-Strips were placed.  Sterile dressing applied.  The  patient turned supine and extubated without difficulty, transferred to the  recovery room in stable condition, able to move his upper and lower  extremities.  Sharolyn Douglas, M.D.    MC/MEDQ  D:  09/11/2002  T:  09/12/2002  Job:  [id number]

## 2010-11-12 NOTE — Assessment & Plan Note (Signed)
HISTORY:  The patient returns to the clinic today accompanied by his wife.  He is a 40 year old male who has undergone a prior L5-S1 laminectomy on the  left for a decompression of the left S1 nerve root.  There is also a  transforaminal lumbar interbody fusion done at L5-S1.  The patient had  undergone this surgery with Dr. Sharolyn Douglas.  He had a follow-up MRI scan  dated May 20, 2003, which showed expected postoperative changes at L5-  S1, chronic left posterior lateral disk herniation at L1-2, unchanged, with  disk bulges at L2-3 and L3-4, unchanged, with disk bulge at L4-5.  There was  no dramatic change since the prior studies done on August 15, 2002, and  January 05, 2002.  The patient was seen in this office for his appointment on July 24, 2003.  At that time we set him up for an L4 transforaminal epidural steroid  injection.  He had the first one on August 07, 2003.  The patient reports  that he gained just temporary relief with that injection.  He is set up for  the second injection over the next few days.  The patient reports that he has attended outpatient therapy approximately  six to eight visits, and also is using a TENS unit.  He does usually use the  TENS unit with a heating pad and gets reasonable relief.  He continues to  take hydrocodone 10/325, approximately eight to 10 tablets per day, with  minimal relief.  He is trying to walk on a treadmill and do some pool  exercises over the next few weeks.  The patient himself is very much interested in a definitive treatment for  his chronic low back pain.  He would like to have a second surgical opinion.  He requested that be with a neurosurgeon.  We have decided to set him up  with Dr. Stefani Dama, one of the local neurosurgeons for at least a  surgical opinion.  If no surgery is indicated, then the patient will be seen  back in this office and followed by myself.  The patient reports that if he sits on the commode  for approximately 10-15  minutes, he has complete left leg numbness and that only improves with  standing.  It generally takes a few minutes before his sensation returns in  his left lower extremity.   MEDICATIONS:  1. Accupril 20 mg daily.  2. Toprol 100 mg daily.  3. Norco 10/325 mg one tab q.4-6h. (8-10 per day).  4. Lexapro 50 mg daily.  5. Ibuprofen p.r.n.   PHYSICAL EXAMINATION:  GENERAL:  A well-appearing, mildly to moderately  overweight adult male.  VITAL SIGNS:  Blood pressure 157/98, pulse 99, respirations 16, O2  saturation 97% on room air.  NEUROLOGIC:  He has strength of 5-/5 throughout his bilateral lower  extremities.  Bulk and tone were normal.  Reflexes were 2+ and symmetrical.  He has decreased lumbar range of motion in all planes in the standing  position.   IMPRESSION:  1. 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  2. Moderate lumbar degenerative disk disease, per a follow-up magnetic     resonance imaging study in November 2004.   PLAN:  At the present time we have set him up for a second opinion with a  neurosurgeon locally, specifically Dr. Danielle Dess.  That visit with Dr. Danielle Dess  will be planned over the next few weeks, as his schedule allows.  I have  asked the patient to obtain the MRI study from Dr. Jake Seats Cohen's office, so  that he can take that with him.  There may be need for further studies, but  will leave that up to Dr. Danielle Dess.  In the meantime, the patient will have a  second lumbar epidural steroid injection.  I have also refilled his  medications, and specifically switched him from hydrocodone to oxycodone.  He is allowed to take 5 mg, one to two tablets p.o. q.8h. p.r.n.  We have  also started him on Robaxin 500 mg, one tablet q.8h. p.r.n.  I have asked  his work to please excuse him from work at the present time.  I will be  completing temporary disability paper work for the patient.  He understands  that heavy lifting is probably not in  the future for him at this time.  I will plan on seeing the patient in followup in approximately one month's  time, to see how he is doing on the pain medicine, even if he has not seen  Dr. Danielle Dess for his first visit.      Ellwood Dense, M.D.   DC/MedQ  D:  08/25/2003 04:54:09  T:  08/25/2003 81:19:14  Job #:  [id number]

## 2010-11-12 NOTE — Discharge Summary (Signed)
[name], [name]                          ACCOUNT NO.:  [id number]   MEDICAL RECORD NO.:  [id number]                   PATIENT TYPE:  INP   LOCATION:  3034                                 FACILITY:  [facility]   PHYSICIAN:  Stefani Dama, M.D.               DATE OF BIRTH:  [birth year]   DATE OF ADMISSION:  01/29/2004  DATE OF DISCHARGE:  02/01/2004                                 DISCHARGE SUMMARY   ADMISSION DIAGNOSES:  1.  Lumbar spondylolysis L4.  2.  Pseudoarthrosis L5-S1.   DISCHARGE DIAGNOSIS:  Spondylolysis of L4.   PROCEDURES:  1.  Removal of hardware.  2.  Exploration of arthrodesis at L5-S1.  3.  Gill procedure of L4.  4.  Posterior lumbar interbody fusion at L4-L5 with fixation implantation of      bone graft.   CONDITION ON DISCHARGE:  Improving.   HOSPITAL COURSE:  [name] is a 40 year old individual who has a had a  previous lumbar fusion at L5-S1.  There is suspicion that he had a  pseudoarthrosis as he had some increased uptake on a bone scan and it was  noted that he had a spondylolysis at L4 vertebrae and this area was indeed  hot on a bone scan.  After efforts of conservative management, he was  advised regarding decompression of his spondylolysis and fixation of L4-L5.  This was performed on January 29, 2004.  Postoperatively, the patient was  gradually mobilized.  His incision has remained clean and dry and he is  ambulatory.  He is moving about quite well.  His level of function seems to  be improving steadily.   DISCHARGE MEDICATIONS:  1.  Percocet for pain.  2.  Valium as a muscle relaxer.   FOLLOW UP:  He will be seen in 2 weeks' time for further followup.  He has  been advised regarding the use of a corset.                                                Stefani Dama, M.D.    [name]  D:  03/05/2004  T:  03/06/2004  Job:  [id number]

## 2010-11-15 ENCOUNTER — Other Ambulatory Visit (HOSPITAL_COMMUNITY): Payer: 59 | Admitting: Psychiatry

## 2010-11-17 ENCOUNTER — Other Ambulatory Visit (HOSPITAL_COMMUNITY): Payer: 59 | Admitting: Psychiatry

## 2010-11-19 ENCOUNTER — Other Ambulatory Visit (HOSPITAL_COMMUNITY): Payer: 59 | Admitting: Psychiatry

## 2010-11-24 ENCOUNTER — Other Ambulatory Visit (HOSPITAL_COMMUNITY): Payer: 59 | Admitting: Psychiatry

## 2010-11-26 ENCOUNTER — Other Ambulatory Visit (HOSPITAL_COMMUNITY): Payer: 59 | Admitting: Psychiatry

## 2010-11-29 ENCOUNTER — Other Ambulatory Visit (HOSPITAL_COMMUNITY): Payer: 59 | Admitting: Psychiatry

## 2010-12-24 ENCOUNTER — Other Ambulatory Visit (HOSPITAL_COMMUNITY): Payer: Self-pay | Admitting: Neurosurgery

## 2010-12-24 ENCOUNTER — Encounter (HOSPITAL_COMMUNITY)
Admission: RE | Admit: 2010-12-24 | Discharge: 2010-12-24 | Disposition: A | Discharge status: Home / Self Care | Payer: 59 | Source: Ambulatory Visit | Attending: Neurosurgery | Admitting: Neurosurgery

## 2010-12-24 ENCOUNTER — Ambulatory Visit (HOSPITAL_COMMUNITY)
Admission: RE | Admit: 2010-12-24 | Discharge: 2010-12-24 | Disposition: A | Discharge status: Home / Self Care | Payer: 59 | Source: Ambulatory Visit | Attending: Neurosurgery | Admitting: Neurosurgery

## 2010-12-24 DIAGNOSIS — M961 Postlaminectomy syndrome, not elsewhere classified: Secondary | ICD-10-CM

## 2010-12-24 DIAGNOSIS — I10 Essential (primary) hypertension: Secondary | ICD-10-CM | POA: Insufficient documentation

## 2010-12-24 DIAGNOSIS — Z01818 Encounter for other preprocedural examination: Secondary | ICD-10-CM | POA: Insufficient documentation

## 2010-12-24 DIAGNOSIS — Z01812 Encounter for preprocedural laboratory examination: Secondary | ICD-10-CM | POA: Insufficient documentation

## 2010-12-24 DIAGNOSIS — Z01810 Encounter for preprocedural cardiovascular examination: Secondary | ICD-10-CM | POA: Insufficient documentation

## 2010-12-24 LAB — COMPREHENSIVE METABOLIC PANEL
ALT: 33 U/L (ref 0–53)
AST: 26 U/L (ref 0–37)
Albumin: 4.8 g/dL (ref 3.5–5.2)
Alkaline Phosphatase: 99 U/L (ref 39–117)
BUN: 20 mg/dL (ref 6–23)
CO2: 26 mEq/L (ref 19–32)
Calcium: 10 mg/dL (ref 8.4–10.5)
Chloride: 98 mEq/L (ref 96–112)
Creatinine, Ser: 1.07 mg/dL (ref 0.50–1.35)
GFR calc Af Amer: >60 mL/min (ref >60)
GFR calc non Af Amer: >60 mL/min (ref >60)
Glucose, Bld: 97 mg/dL (ref 70–99)
Potassium: 4 mEq/L (ref 3.5–5.1)
Sodium: 137 mEq/L (ref 135–145)
Total Bilirubin: 0.5 mg/dL (ref 0.3–1.2)
Total Protein: 7.8 g/dL (ref 6.0–8.3)

## 2010-12-24 LAB — CBC
HCT: 43 % (ref 39.0–52.0)
Hemoglobin: 15.7 g/dL (ref 13.0–17.0)
MCH: 31.3 pg (ref 26.0–34.0)
MCHC: 36.5 g/dL — ABNORMAL HIGH (ref 30.0–36.0)
MCV: 85.7 fL (ref 78.0–100.0)
Platelets: 177 10*3/uL (ref 150–400)
RBC: 5.02 MIL/uL (ref 4.22–5.81)
RDW: 13.5 % (ref 11.5–15.5)
WBC: 7.9 10*3/uL (ref 4.0–10.5)

## 2010-12-24 LAB — SURGICAL PCR SCREEN
MRSA, PCR: NEGATIVE
Staphylococcus aureus: NEGATIVE

## 2010-12-28 ENCOUNTER — Observation Stay (HOSPITAL_COMMUNITY)
Admission: RE | Admit: 2010-12-28 | Discharge: 2010-12-28 | Disposition: A | Discharge status: Home / Self Care | Payer: 59 | Source: Ambulatory Visit | Attending: Neurosurgery | Admitting: Neurosurgery

## 2010-12-28 ENCOUNTER — Observation Stay (HOSPITAL_COMMUNITY): Payer: 59

## 2010-12-28 DIAGNOSIS — Z01810 Encounter for preprocedural cardiovascular examination: Secondary | ICD-10-CM | POA: Insufficient documentation

## 2010-12-28 DIAGNOSIS — Z01818 Encounter for other preprocedural examination: Secondary | ICD-10-CM | POA: Insufficient documentation

## 2010-12-28 DIAGNOSIS — Z01812 Encounter for preprocedural laboratory examination: Secondary | ICD-10-CM | POA: Insufficient documentation

## 2010-12-28 DIAGNOSIS — Z79899 Other long term (current) drug therapy: Secondary | ICD-10-CM | POA: Insufficient documentation

## 2010-12-28 DIAGNOSIS — I10 Essential (primary) hypertension: Secondary | ICD-10-CM | POA: Insufficient documentation

## 2010-12-28 DIAGNOSIS — M961 Postlaminectomy syndrome, not elsewhere classified: Principal | ICD-10-CM | POA: Insufficient documentation

## 2010-12-28 HISTORY — PX: SPINAL CORD STIMULATOR IMPLANT: SHX2422

## 2011-01-25 NOTE — Op Note (Signed)
  NAMEJEROME, [name] NO.:  [id number]  MEDICAL RECORD NO.:  [id number]  LOCATION:  3534                         FACILITY:  [facility]  PHYSICIAN:  Reinaldo Meeker, M.D. DATE OF BIRTH:  [birth year]  DATE OF PROCEDURE:  12/28/2010 DATE OF DISCHARGE:  12/28/2010                              OPERATIVE REPORT   PREOPERATIVE DIAGNOSIS:  Failed back syndrome.  POSTOPERATIVE DIAGNOSIS:  Failed back syndrome.  PROCEDURE:  Permanent spinal stimulator implant with generator implant.  SURGEON:  Reinaldo Meeker, MD  PROCEDURE IN DETAIL:  After placed in the prone position, the patient's back and left flank were shaved, prepped, and draped in usual sterile fashion.  Localizing AP fluoroscopy was used to identify the appropriate level.  Midline incision was made above the spinous processes of T9 and T10.  An incision was carried down the spinous processes.  Subperiosteal dissection was then carried out on the left-sided spinous processes and lamina.  Self-retaining retractor was placed for exposure.  X-rays showed approach to the appropriate level.  Inferior one half of the T9 lamina on the left side and superior edge of the T10 lamina were removed.  We then removed soft tissue, ligamentum flavum to allow [place] access to the epidural space.  We then did some trial passing of the lead and this went into excellent position without difficulty.  We then made a small incision of the left flank.  I dissected a pocket for the generator and tested to make sure that it was a nice fit which it was. We then passed a tunneler from the flank incision to the thoracic incision and passed the lead in the opposite direction without difficulty.  We then attached the lead to the battery, tested it, and found to have good contact.  We then slid the lead back up into appropriate position which was confirmed by fluoroscopy.  We decided to divide the midline and very close to the midline centered  above the body of T8 and approach at T9.  We then irrigated both wounds copiously and closed with inverted Vicryl on the fascia, subcutaneous tissues, and we placed staples on the skin.  Two sterile dressings were placed.  The patient was extubated and taken to recovery room in stable condition.          ______________________________ Reinaldo Meeker, M.D.     ROK/MEDQ  D:  12/28/2010  T:  12/28/2010  Job:  [id number]  Electronically Signed by Aliene Beams M.D. on 01/25/2011 02:09:31 PM

## 2011-02-17 ENCOUNTER — Other Ambulatory Visit: Payer: Self-pay | Admitting: Physical Medicine and Rehabilitation

## 2011-02-17 DIAGNOSIS — M25511 Pain in right shoulder: Secondary | ICD-10-CM

## 2011-02-19 ENCOUNTER — Other Ambulatory Visit: Payer: 59

## 2011-03-16 ENCOUNTER — Other Ambulatory Visit: Payer: Self-pay | Admitting: Neurosurgery

## 2011-03-16 DIAGNOSIS — M549 Dorsalgia, unspecified: Secondary | ICD-10-CM

## 2011-03-16 MED ORDER — DIAZEPAM 2 MG PO TABS
10.0000 mg | ORAL_TABLET | Freq: Once | ORAL | Status: AC
  Administered 2011-03-17: 10 mg via ORAL

## 2011-03-17 ENCOUNTER — Other Ambulatory Visit: Payer: 59

## 2011-03-17 ENCOUNTER — Ambulatory Visit
Admission: RE | Admit: 2011-03-17 | Discharge: 2011-03-17 | Disposition: A | Discharge status: Home / Self Care | Payer: 59 | Source: Ambulatory Visit | Attending: Neurosurgery | Admitting: Neurosurgery

## 2011-03-17 DIAGNOSIS — M549 Dorsalgia, unspecified: Secondary | ICD-10-CM

## 2011-03-17 DIAGNOSIS — M546 Pain in thoracic spine: Secondary | ICD-10-CM

## 2011-03-17 MED ORDER — IOHEXOL 300 MG/ML  SOLN
10.0000 mL | Freq: Once | INTRAMUSCULAR | Status: AC | PRN
  Administered 2011-03-17: 10 mL via INTRATHECAL

## 2011-03-17 MED ORDER — SODIUM CHLORIDE 0.9 % IV SOLN: 4.0000 mg | Freq: Four times a day (QID) | INTRAVENOUS | Status: DC | PRN

## 2011-03-17 MED ORDER — ONDANSETRON HCL 4 MG/2ML IJ SOLN
4.0000 mg | Freq: Once | INTRAMUSCULAR | Status: AC
  Administered 2011-03-17: 4 mg via INTRAMUSCULAR

## 2011-03-17 MED ORDER — HYDROMORPHONE HCL 2 MG/ML IJ SOLN
2.0000 mg | Freq: Once | INTRAMUSCULAR | Status: AC
  Administered 2011-03-17: 2 mg via INTRAMUSCULAR

## 2011-04-08 LAB — CBC
HCT: 39.7
Hemoglobin: 14.1
MCHC: 35.4
MCV: 92.7
Platelets: 231
RBC: 4.28
RDW: 14.5 — ABNORMAL HIGH
WBC: 6.8

## 2011-04-08 LAB — DIFFERENTIAL
Basophils Absolute: 0
Basophils Relative: 0
Eosinophils Absolute: 0.1
Eosinophils Relative: 1
Lymphocytes Relative: 29
Lymphs Abs: 2
Monocytes Absolute: 0.6
Monocytes Relative: 9
Neutro Abs: 4.1
Neutrophils Relative %: 61

## 2011-04-08 LAB — BASIC METABOLIC PANEL
BUN: 14
CO2: 28
Calcium: 9.9
Chloride: 104
Creatinine, Ser: 0.79
GFR calc Af Amer: >60
GFR calc non Af Amer: >60
Glucose, Bld: 74
Potassium: 4.4
Sodium: 139

## 2011-04-08 LAB — ABO/RH: ABO/RH(D): A NEG

## 2011-04-08 LAB — TYPE AND SCREEN
ABO/RH(D): A NEG
Antibody Screen: NEGATIVE

## 2011-05-02 ENCOUNTER — Encounter (HOSPITAL_COMMUNITY): Payer: Self-pay

## 2011-05-04 ENCOUNTER — Encounter (HOSPITAL_COMMUNITY): Payer: Self-pay

## 2011-05-04 ENCOUNTER — Encounter (HOSPITAL_COMMUNITY)
Admission: RE | Admit: 2011-05-04 | Discharge: 2011-05-04 | Disposition: A | Discharge status: Home / Self Care | Payer: 59 | Source: Ambulatory Visit | Attending: Neurosurgery | Admitting: Neurosurgery

## 2011-05-04 HISTORY — DX: Essential (primary) hypertension: I10

## 2011-05-04 HISTORY — DX: Unspecified osteoarthritis, unspecified site: M19.90

## 2011-05-04 HISTORY — DX: Anxiety disorder, unspecified: F41.9

## 2011-05-04 LAB — CBC
HCT: 46.3 % (ref 39.0–52.0)
Hemoglobin: 16.7 g/dL (ref 13.0–17.0)
MCH: 31.7 pg (ref 26.0–34.0)
MCHC: 36.1 g/dL — ABNORMAL HIGH (ref 30.0–36.0)
MCV: 87.9 fL (ref 78.0–100.0)
Platelets: 158 10*3/uL (ref 150–400)
RBC: 5.27 MIL/uL (ref 4.22–5.81)
RDW: 13.7 % (ref 11.5–15.5)
WBC: 7.6 10*3/uL (ref 4.0–10.5)

## 2011-05-04 LAB — TYPE AND SCREEN
ABO/RH(D): A NEG
Antibody Screen: NEGATIVE

## 2011-05-04 LAB — DIFFERENTIAL
Basophils Absolute: 0 10*3/uL (ref 0.0–0.1)
Basophils Relative: 0 % (ref 0–1)
Eosinophils Absolute: 0.2 10*3/uL (ref 0.0–0.7)
Eosinophils Relative: 2 % (ref 0–5)
Lymphocytes Relative: 23 % (ref 12–46)
Lymphs Abs: 1.8 10*3/uL (ref 0.7–4.0)
Monocytes Absolute: 0.6 10*3/uL (ref 0.1–1.0)
Monocytes Relative: 8 % (ref 3–12)
Neutro Abs: 5.1 10*3/uL (ref 1.7–7.7)
Neutrophils Relative %: 67 % (ref 43–77)

## 2011-05-04 LAB — SURGICAL PCR SCREEN
MRSA, PCR: NEGATIVE
Staphylococcus aureus: POSITIVE — AB

## 2011-05-04 NOTE — Pre-Procedure Instructions (Signed)
[name]  05/04/2011   Your procedure is scheduled on:  05/09/11  Report to [facility] at 930 AM.  Call this number if you have problems the morning of surgery: [phone]   Remember:   Do not eat food:After Midnight.  Do not drink clear liquids: 4 Hours before arrival.  Take these medicines the morning of surgery with A SIP OF WATER: norvasc,atenolol,neurontin,oxycodone,   Do not wear jewelry, make-up or nail polish.  Do not wear lotions, powders, or perfumes. You may wear deodorant.  Do not shave 48 hours prior to surgery.  Do not bring valuables to the hospital.  Contacts, dentures or bridgework may not be worn into surgery.  Leave suitcase in the car. After surgery it may be brought to your room.  For patients admitted to the hospital, checkout time is 11:00 AM the day of discharge.   Patients discharged the day of surgery will not be allowed to drive home.  Name and phone number of your driver: family  Special Instructions: CHG Shower Use Special Wash: 1/2 bottle night before surgery and 1/2 bottle morning of surgery.   Please read over the following fact sheets that you were given: MRSA Information

## 2011-05-06 NOTE — H&P (Signed)
[name] is an 40 y.o. male.   Chief Complaint: Back pain with [name] are maintained. HPI: Patient has history of [name] previous L4-S1 decompression and fusion with instrumentation. Patient presents now with back and bilateral or from [name] pain consistent with neurogenic claudication. Workup demonstrates evidence of adjacent level disease with stenosis at L3-4. Patient has early degeneration and some degree of facet arthropathy at L2-3. Patient has failed conservative management and presents now for L3-4 decompression and fusion at L2-3 dynamic fusion.  Past Medical History  Diagnosis Date  . Hypertension   . Arthritis   . Anxiety   . Depression     Past Surgical History  Procedure Date  . Back surgery     5 back surgeries  . Spinal cord stimulator implant   . Nasal sinus surgery     No family history on file. Social History:  reports that he has never smoked. He does not have any smokeless tobacco history on file. He reports that he drinks about 2.4 ounces of alcohol per week. He reports that he does not use illicit drugs.  Allergies:  Allergies  Allergen Reactions  . Lisinopril Anaphylaxis  . Penicillins Hives    No current facility-administered medications on file as of .   No current outpatient prescriptions on file as of .    No results found for this or any previous visit (from the past 48 hour(s)). No results found.  Review of Systems  All other systems reviewed and are negative.    There were no vitals taken for this visit. Physical Exam  Constitutional: He is oriented to person, place, and time. He appears well-developed and well-nourished.  HENT:  Head: Normocephalic and atraumatic.  Eyes: Conjunctivae and EOM are normal. Pupils are equal, round, and reactive to light.  Neck: Normal range of motion. Neck supple.  Cardiovascular: Normal rate, regular rhythm and normal heart sounds.   Respiratory: Effort normal and breath sounds normal.  GI: Soft. Bowel  sounds are normal.  Musculoskeletal: Normal range of motion.  Neurological: He is alert and oriented to person, place, and time. He has normal strength and normal reflexes. No cranial nerve deficit or sensory deficit. He displays [name] negative Romberg sign.     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 Plan is for L3-4 decompressive laminectomy with L3-4 posterior lumbar my fusion utilizing tangent interbody allograft wedge Telamon interbody peek cage and local autograft in. This is coupled with posterior lateral arthrodesis utilizing globus transitional pedicle screw station from L2-L4. Patient aware of the risks and benefits and wishes to proceed.  [name] 05/06/2011, 6:11 PM

## 2011-05-08 MED ORDER — VANCOMYCIN HCL 500 MG IV SOLR
500.0000 mg | INTRAVENOUS | Status: DC
  Filled 2011-05-08: qty 500

## 2011-05-09 ENCOUNTER — Encounter (HOSPITAL_COMMUNITY): Payer: Self-pay | Admitting: Anesthesiology

## 2011-05-09 ENCOUNTER — Inpatient Hospital Stay (HOSPITAL_COMMUNITY): Payer: 59

## 2011-05-09 ENCOUNTER — Encounter (HOSPITAL_COMMUNITY)
Admission: RE | Disposition: A | Discharge status: Home / Self Care | Payer: Self-pay | Source: Ambulatory Visit | Attending: Neurosurgery

## 2011-05-09 ENCOUNTER — Inpatient Hospital Stay (HOSPITAL_COMMUNITY)
Admission: RE | Admit: 2011-05-09 | Discharge: 2011-05-11 | DRG: 460 | Disposition: A | Discharge status: Home / Self Care | Payer: 59 | Source: Ambulatory Visit | Attending: Neurosurgery | Admitting: Neurosurgery

## 2011-05-09 ENCOUNTER — Inpatient Hospital Stay (HOSPITAL_COMMUNITY): Payer: 59 | Admitting: Anesthesiology

## 2011-05-09 DIAGNOSIS — M129 Arthropathy, unspecified: Secondary | ICD-10-CM | POA: Diagnosis present

## 2011-05-09 DIAGNOSIS — F411 Generalized anxiety disorder: Secondary | ICD-10-CM | POA: Diagnosis present

## 2011-05-09 DIAGNOSIS — F329 Major depressive disorder, single episode, unspecified: Secondary | ICD-10-CM | POA: Diagnosis present

## 2011-05-09 DIAGNOSIS — M48062 Spinal stenosis, lumbar region with neurogenic claudication: Secondary | ICD-10-CM | POA: Diagnosis present

## 2011-05-09 DIAGNOSIS — M51379 Other intervertebral disc degeneration, lumbosacral region without mention of lumbar back pain or lower extremity pain: Principal | ICD-10-CM | POA: Diagnosis present

## 2011-05-09 DIAGNOSIS — Z01812 Encounter for preprocedural laboratory examination: Secondary | ICD-10-CM

## 2011-05-09 DIAGNOSIS — I10 Essential (primary) hypertension: Secondary | ICD-10-CM | POA: Diagnosis present

## 2011-05-09 DIAGNOSIS — Z01818 Encounter for other preprocedural examination: Secondary | ICD-10-CM

## 2011-05-09 DIAGNOSIS — M5137 Other intervertebral disc degeneration, lumbosacral region: Principal | ICD-10-CM | POA: Diagnosis present

## 2011-05-09 DIAGNOSIS — F3289 Other specified depressive episodes: Secondary | ICD-10-CM | POA: Diagnosis present

## 2011-05-09 HISTORY — PX: POSTERIOR LAMINECTOMY / DECOMPRESSION LUMBAR SPINE: SUR740

## 2011-05-09 SURGERY — POSTERIOR LUMBAR FUSION 3 LEVEL
Anesthesia: General

## 2011-05-09 MED ORDER — NEOSTIGMINE METHYLSULFATE 1 MG/ML IJ SOLN
INTRAMUSCULAR | Status: DC | PRN
  Administered 2011-05-09: 5 mg via INTRAVENOUS

## 2011-05-09 MED ORDER — VANCOMYCIN HCL IN DEXTROSE 1-5 GM/200ML-% IV SOLN
1000.0000 mg | INTRAVENOUS | Status: DC
  Filled 2011-05-09: qty 200

## 2011-05-09 MED ORDER — HYDROMORPHONE HCL PF 1 MG/ML IJ SOLN
0.5000 mg | INTRAMUSCULAR | Status: DC | PRN
  Administered 2011-05-09 – 2011-05-11 (×7): 1 mg via INTRAVENOUS
  Filled 2011-05-09 (×7): qty 1

## 2011-05-09 MED ORDER — SODIUM CHLORIDE 0.9 % IR SOLN
Status: DC | PRN
  Administered 2011-05-09: 14:00:00

## 2011-05-09 MED ORDER — ONDANSETRON HCL 4 MG/2ML IJ SOLN
INTRAMUSCULAR | Status: DC | PRN
  Administered 2011-05-09: 4 mg via INTRAVENOUS

## 2011-05-09 MED ORDER — EPHEDRINE SULFATE 50 MG/ML IJ SOLN
INTRAMUSCULAR | Status: DC | PRN
  Administered 2011-05-09: 10 mg via INTRAVENOUS

## 2011-05-09 MED ORDER — ATENOLOL 25 MG PO TABS
25.0000 mg | ORAL_TABLET | Freq: Every day | ORAL | Status: DC
  Administered 2011-05-10 – 2011-05-11 (×2): 25 mg via ORAL
  Filled 2011-05-09 (×3): qty 1

## 2011-05-09 MED ORDER — DIAZEPAM 5 MG PO TABS
5.0000 mg | ORAL_TABLET | Freq: Four times a day (QID) | ORAL | Status: DC | PRN
  Administered 2011-05-09 – 2011-05-11 (×8): 10 mg via ORAL
  Filled 2011-05-09 (×8): qty 2

## 2011-05-09 MED ORDER — DEXAMETHASONE SODIUM PHOSPHATE 10 MG/ML IJ SOLN
INTRAMUSCULAR | Status: DC | PRN
  Administered 2011-05-09: 10 mg via INTRAVENOUS

## 2011-05-09 MED ORDER — ACETAMINOPHEN 650 MG RE SUPP: 650.0000 mg | RECTAL | Status: DC | PRN

## 2011-05-09 MED ORDER — SUFENTANIL CITRATE 50 MCG/ML IV SOLN
INTRAVENOUS | Status: DC | PRN
  Administered 2011-05-09 (×4): 10 ug via INTRAVENOUS
  Administered 2011-05-09: 30 ug via INTRAVENOUS
  Administered 2011-05-09: 5 ug via INTRAVENOUS
  Administered 2011-05-09: 15 ug via INTRAVENOUS
  Administered 2011-05-09: 10 ug via INTRAVENOUS

## 2011-05-09 MED ORDER — HYDROMORPHONE 0.3 MG/ML IV SOLN
INTRAVENOUS | Status: DC
  Administered 2011-05-09 – 2011-05-10 (×2): 7.5 mg via INTRAVENOUS
  Administered 2011-05-10: 3.2 mg via INTRAVENOUS
  Administered 2011-05-10: 0.3 mg via INTRAVENOUS
  Administered 2011-05-10: 2.1 mg via INTRAVENOUS
  Administered 2011-05-10: 3 mg via INTRAVENOUS
  Filled 2011-05-09 (×5): qty 25

## 2011-05-09 MED ORDER — VANCOMYCIN HCL 1000 MG IV SOLR
1000.0000 mg | INTRAVENOUS | Status: DC | PRN
  Administered 2011-05-09: 1 g via INTRAVENOUS

## 2011-05-09 MED ORDER — HYDROMORPHONE HCL PF 1 MG/ML IJ SOLN
0.2500 mg | INTRAMUSCULAR | Status: DC | PRN
  Administered 2011-05-09 (×4): 0.5 mg via INTRAVENOUS

## 2011-05-09 MED ORDER — OXYCODONE-ACETAMINOPHEN 5-325 MG PO TABS
1.0000 | ORAL_TABLET | ORAL | Status: DC | PRN
  Administered 2011-05-09 – 2011-05-11 (×9): 2 via ORAL
  Filled 2011-05-09 (×9): qty 2

## 2011-05-09 MED ORDER — ROCURONIUM BROMIDE 100 MG/10ML IV SOLN
INTRAVENOUS | Status: DC | PRN
  Administered 2011-05-09 (×3): 50 mg via INTRAVENOUS

## 2011-05-09 MED ORDER — ONDANSETRON HCL 4 MG/2ML IJ SOLN: 4.0000 mg | Freq: Four times a day (QID) | INTRAMUSCULAR | Status: DC | PRN

## 2011-05-09 MED ORDER — SODIUM CHLORIDE 0.9 % IJ SOLN: 9.0000 mL | INTRAMUSCULAR | Status: DC | PRN

## 2011-05-09 MED ORDER — HYDROCHLOROTHIAZIDE 25 MG PO TABS
25.0000 mg | ORAL_TABLET | ORAL | Status: DC
  Administered 2011-05-10 – 2011-05-11 (×2): 25 mg via ORAL
  Filled 2011-05-09 (×3): qty 1

## 2011-05-09 MED ORDER — ACETAMINOPHEN 325 MG PO TABS: 650.0000 mg | ORAL_TABLET | ORAL | Status: DC | PRN

## 2011-05-09 MED ORDER — SERTRALINE HCL 100 MG PO TABS
200.0000 mg | ORAL_TABLET | ORAL | Status: DC
  Administered 2011-05-10 – 2011-05-11 (×2): 200 mg via ORAL
  Filled 2011-05-09 (×3): qty 2

## 2011-05-09 MED ORDER — VANCOMYCIN HCL IN DEXTROSE 1-5 GM/200ML-% IV SOLN
1000.0000 mg | Freq: Two times a day (BID) | INTRAVENOUS | Status: DC
  Administered 2011-05-10 – 2011-05-11 (×4): 1000 mg via INTRAVENOUS
  Filled 2011-05-09 (×5): qty 200

## 2011-05-09 MED ORDER — MIDAZOLAM HCL 5 MG/5ML IJ SOLN
INTRAMUSCULAR | Status: DC | PRN
  Administered 2011-05-09: 2 mg via INTRAVENOUS

## 2011-05-09 MED ORDER — NALOXONE HCL 0.4 MG/ML IJ SOLN: 0.4000 mg | INTRAMUSCULAR | Status: DC | PRN

## 2011-05-09 MED ORDER — MENTHOL 3 MG MT LOZG
1.0000 | LOZENGE | OROMUCOSAL | Status: DC | PRN
  Filled 2011-05-09: qty 9

## 2011-05-09 MED ORDER — DEXAMETHASONE SODIUM PHOSPHATE 10 MG/ML IJ SOLN: 10.0000 mg | Freq: Once | INTRAMUSCULAR | Status: DC

## 2011-05-09 MED ORDER — HETASTARCH-ELECTROLYTES 6 % IV SOLN
INTRAVENOUS | Status: DC | PRN
  Administered 2011-05-09 (×2): via INTRAVENOUS

## 2011-05-09 MED ORDER — BUPIVACAINE HCL (PF) 0.25 % IJ SOLN
INTRAMUSCULAR | Status: DC | PRN
  Administered 2011-05-09: 30 mL

## 2011-05-09 MED ORDER — LACTATED RINGERS IV SOLN
INTRAVENOUS | Status: DC | PRN
  Administered 2011-05-09 (×3): via INTRAVENOUS

## 2011-05-09 MED ORDER — PROPOFOL 10 MG/ML IV EMUL
INTRAVENOUS | Status: DC | PRN
  Administered 2011-05-09: 200 mg via INTRAVENOUS

## 2011-05-09 MED ORDER — MORPHINE SULFATE CR 60 MG PO TB12
60.0000 mg | ORAL_TABLET | Freq: Two times a day (BID) | ORAL | Status: DC
  Administered 2011-05-09 – 2011-05-10 (×2): 60 mg via ORAL
  Filled 2011-05-09 (×2): qty 1

## 2011-05-09 MED ORDER — DOCUSATE SODIUM 100 MG PO CAPS
100.0000 mg | ORAL_CAPSULE | Freq: Two times a day (BID) | ORAL | Status: DC
  Administered 2011-05-09 – 2011-05-11 (×4): 100 mg via ORAL
  Filled 2011-05-09 (×4): qty 1

## 2011-05-09 MED ORDER — SODIUM CHLORIDE 0.9 % IV SOLN
INTRAVENOUS | Status: DC
  Administered 2011-05-10: 13:00:00 via INTRAVENOUS

## 2011-05-09 MED ORDER — SODIUM CHLORIDE 0.9 % IV SOLN
INTRAVENOUS | Status: DC | PRN
  Administered 2011-05-09: 16:00:00 via INTRAVENOUS

## 2011-05-09 MED ORDER — VECURONIUM BROMIDE 10 MG IV SOLR
INTRAVENOUS | Status: DC | PRN
  Administered 2011-05-09: 2 mg via INTRAVENOUS
  Administered 2011-05-09: 1 mg via INTRAVENOUS
  Administered 2011-05-09: 2 mg via INTRAVENOUS
  Administered 2011-05-09: 3 mg via INTRAVENOUS

## 2011-05-09 MED ORDER — OXYCODONE HCL 10 MG PO TABS: 1.0000 | ORAL_TABLET | Freq: Four times a day (QID) | ORAL | Status: DC | PRN

## 2011-05-09 MED ORDER — PHENOL 1.4 % MT LIQD: 1.0000 | OROMUCOSAL | Status: DC | PRN

## 2011-05-09 MED ORDER — DIPHENHYDRAMINE HCL 50 MG/ML IJ SOLN: 12.5000 mg | Freq: Four times a day (QID) | INTRAMUSCULAR | Status: DC | PRN

## 2011-05-09 MED ORDER — GABAPENTIN 800 MG PO TABS
800.0000 mg | ORAL_TABLET | Freq: Three times a day (TID) | ORAL | Status: DC
  Administered 2011-05-09 – 2011-05-10 (×2): 800 mg via ORAL
  Filled 2011-05-09 (×4): qty 1

## 2011-05-09 MED ORDER — AMLODIPINE BESYLATE 10 MG PO TABS
10.0000 mg | ORAL_TABLET | ORAL | Status: DC
  Administered 2011-05-10 – 2011-05-11 (×2): 10 mg via ORAL
  Filled 2011-05-09 (×3): qty 1

## 2011-05-09 MED ORDER — THROMBIN 20000 UNITS EX KIT
PACK | CUTANEOUS | Status: DC | PRN
  Administered 2011-05-09 (×3): 20000 [IU] via TOPICAL

## 2011-05-09 MED ORDER — THROMBIN 5000 UNITS EX KIT
PACK | OROMUCOSAL | Status: DC | PRN
  Administered 2011-05-09: 17:00:00 via TOPICAL

## 2011-05-09 MED ORDER — TRAZODONE HCL 100 MG PO TABS
100.0000 mg | ORAL_TABLET | Freq: Every day | ORAL | Status: DC
  Administered 2011-05-09 – 2011-05-10 (×2): 100 mg via ORAL
  Filled 2011-05-09 (×4): qty 1

## 2011-05-09 MED ORDER — HEMOSTATIC AGENTS (NO CHARGE) OPTIME
TOPICAL | Status: DC | PRN
  Administered 2011-05-09 (×3): 1 via TOPICAL

## 2011-05-09 MED ORDER — ONDANSETRON HCL 4 MG/2ML IJ SOLN: 4.0000 mg | INTRAMUSCULAR | Status: DC | PRN

## 2011-05-09 MED ORDER — GLYCOPYRROLATE 0.2 MG/ML IJ SOLN
INTRAMUSCULAR | Status: DC | PRN
  Administered 2011-05-09: .8 mg via INTRAVENOUS

## 2011-05-09 MED ORDER — DIPHENHYDRAMINE HCL 12.5 MG/5ML PO ELIX: 12.5000 mg | ORAL_SOLUTION | Freq: Four times a day (QID) | ORAL | Status: DC | PRN

## 2011-05-09 MED ORDER — OXYCODONE HCL 5 MG PO TABS
10.0000 mg | ORAL_TABLET | ORAL | Status: DC | PRN
  Administered 2011-05-10 (×3): 10 mg via ORAL
  Filled 2011-05-09 (×3): qty 2

## 2011-05-09 MED ORDER — SODIUM CHLORIDE 0.9 % IV SOLN
10000.0000 ug | INTRAVENOUS | Status: DC | PRN
  Administered 2011-05-09: 20 ug/min via INTRAVENOUS

## 2011-05-09 MED ORDER — SODIUM CHLORIDE 0.9 % IR SOLN
Status: DC | PRN
  Administered 2011-05-09: 1000 mL

## 2011-05-09 SURGICAL SUPPLY — 69 items
ADH SKN CLS APL DERMABOND .7 (GAUZE/BANDAGES/DRESSINGS) ×1
APL SKNCLS STERI-STRIP NONHPOA (GAUZE/BANDAGES/DRESSINGS) ×1
BAG DECANTER FOR FLEXI CONT (MISCELLANEOUS) ×2 IMPLANT
BENZOIN TINCTURE PRP APPL 2/3 (GAUZE/BANDAGES/DRESSINGS) ×2 IMPLANT
BLADE SURG ROTATE 9660 (MISCELLANEOUS) ×1 IMPLANT
BRUSH SCRUB EZ PLAIN DRY (MISCELLANEOUS) ×2 IMPLANT
BUR MATCHSTICK NEURO 3.0 LAGG (BURR) ×2 IMPLANT
CANISTER SUCTION 2500CC (MISCELLANEOUS) ×2 IMPLANT
CLOTH BEACON ORANGE TIMEOUT ST (SAFETY) ×2 IMPLANT
CONT SPEC 4OZ CLIKSEAL STRL BL (MISCELLANEOUS) ×4 IMPLANT
COVER BACK TABLE 24X17X13 BIG (DRAPES) IMPLANT
COVER TABLE BACK 60X90 (DRAPES) ×2 IMPLANT
DECANTER SPIKE VIAL GLASS SM (MISCELLANEOUS) ×2 IMPLANT
DERMABOND ADVANCED (GAUZE/BANDAGES/DRESSINGS) ×1
DERMABOND ADVANCED .7 DNX12 (GAUZE/BANDAGES/DRESSINGS) ×1 IMPLANT
DRAPE C-ARM 42X72 X-RAY (DRAPES) ×4 IMPLANT
DRAPE LAPAROTOMY 100X72X124 (DRAPES) ×2 IMPLANT
DRAPE POUCH INSTRU U-SHP 10X18 (DRAPES) ×2 IMPLANT
DRAPE PROXIMA HALF (DRAPES) IMPLANT
DRAPE SURG 17X23 STRL (DRAPES) ×8 IMPLANT
ELECT REM PT RETURN 9FT ADLT (ELECTROSURGICAL) ×2
ELECTRODE REM PT RTRN 9FT ADLT (ELECTROSURGICAL) ×1 IMPLANT
EVACUATOR 1/8 PVC DRAIN (DRAIN) ×2 IMPLANT
GAUZE SPONGE 4X4 16PLY XRAY LF (GAUZE/BANDAGES/DRESSINGS) ×4 IMPLANT
GLOVE BIO SURGEON STRL SZ8 (GLOVE) ×1 IMPLANT
GLOVE BIOGEL PI IND STRL 7.0 (GLOVE) IMPLANT
GLOVE BIOGEL PI INDICATOR 7.0 (GLOVE) ×2
GLOVE ECLIPSE 8.5 STRL (GLOVE) ×4 IMPLANT
GLOVE EXAM NITRILE LRG STRL (GLOVE) IMPLANT
GLOVE EXAM NITRILE MD LF STRL (GLOVE) ×2 IMPLANT
GLOVE EXAM NITRILE XL STR (GLOVE) IMPLANT
GLOVE EXAM NITRILE XS STR PU (GLOVE) IMPLANT
GLOVE INDICATOR 8.5 STRL (GLOVE) ×1 IMPLANT
GLOVE SURG SS PI 6.5 STRL IVOR (GLOVE) ×4 IMPLANT
GOWN BRE IMP SLV AUR LG STRL (GOWN DISPOSABLE) ×1 IMPLANT
GOWN BRE IMP SLV AUR XL STRL (GOWN DISPOSABLE) ×5 IMPLANT
GOWN STRL REIN 2XL LVL4 (GOWN DISPOSABLE) ×1 IMPLANT
HEMOSTAT POWDER KIT SURGIFOAM (HEMOSTASIS) ×1 IMPLANT
KIT BASIN OR (CUSTOM PROCEDURE TRAY) ×2 IMPLANT
KIT ROOM TURNOVER OR (KITS) ×2 IMPLANT
MARKER SKIN DUAL TIP RULER LAB (MISCELLANEOUS) ×1 IMPLANT
MILL MEDIUM DISP (BLADE) ×1 IMPLANT
NEEDLE HYPO 22GX1.5 SAFETY (NEEDLE) ×2 IMPLANT
NS IRRIG 1000ML POUR BTL (IV SOLUTION) ×2 IMPLANT
PACK LAMINECTOMY NEURO (CUSTOM PROCEDURE TRAY) ×2 IMPLANT
PATTIES SURGICAL 1X1 (DISPOSABLE) ×3 IMPLANT
SCREW PEDICLE 6.5MMX45MM (Screw) ×4 IMPLANT
SCREW PEDICLE 6.5X40MM (Screw) ×2 IMPLANT
SCREW PEDICLE 6.5X45 (Screw) IMPLANT
SPONGE GAUZE 4X4 12PLY (GAUZE/BANDAGES/DRESSINGS) ×2 IMPLANT
SPONGE LAP 4X18 X RAY DECT (DISPOSABLE) ×1 IMPLANT
SPONGE SURGIFOAM ABS GEL 100 (HEMOSTASIS) ×4 IMPLANT
STRIP CLOSURE SKIN 1/2X4 (GAUZE/BANDAGES/DRESSINGS) ×4 IMPLANT
SUT VIC AB 0 CT1 18XCR BRD8 (SUTURE) ×2 IMPLANT
SUT VIC AB 0 CT1 8-18 (SUTURE) ×4
SUT VIC AB 2-0 CT1 18 (SUTURE) ×4 IMPLANT
SUT VIC AB 3-0 SH 8-18 (SUTURE) ×4 IMPLANT
SYR 20ML ECCENTRIC (SYRINGE) ×2 IMPLANT
TAPE CLOTH SURG 4X10 WHT LF (GAUZE/BANDAGES/DRESSINGS) ×1 IMPLANT
TELAMON 12X26 (Cage) ×1 IMPLANT
TOWEL OR 17X24 6PK STRL BLUE (TOWEL DISPOSABLE) ×2 IMPLANT
TOWEL OR 17X26 10 PK STRL BLUE (TOWEL DISPOSABLE) ×2 IMPLANT
TRAY FOLEY CATH 14FRSI W/METER (CATHETERS) ×2 IMPLANT
Transition 2level implant lordosed 30mm (Rod) ×1 IMPLANT
Transition 6.5mm HA 0.5mm StepDual outer Diameter (Screw) ×1 IMPLANT
Transition 6.5mmHA 0.5mm Step Dual Outer Diameter (Screw) ×1 IMPLANT
WATER STERILE IRR 1000ML POUR (IV SOLUTION) ×2 IMPLANT
WEDGE TANGENT 12X26MM ×1 IMPLANT
transition 2level implant lordosed 34mm (Rod) ×1 IMPLANT

## 2011-05-09 NOTE — Anesthesia Postprocedure Evaluation (Signed)
  Anesthesia Post-op Note  Patient: [name]  Procedure(s) Performed:  POSTERIOR LUMBAR FUSION 3 LEVEL - RE-EXPLORATION OF L4-5, FUSION, REMOVAL OF HARDWARE, L3-4 DLL, PLIF, TANGENT, TELAMON CAGE, L2-4 PLA W/GLOBUS TRANSITIONAL PEDICLE SCREW FIXATION  Patient Location: PACU  Anesthesia Type: General  Level of Consciousness: awake, alert  and oriented  Airway and Oxygen Therapy: Patient connected to nasal cannula oxygen  Post-op Pain: severe  Post-op Assessment: Post-op Vital signs reviewed and Patient's Cardiovascular Status Stable  Post-op Vital Signs: stable  Complications: No apparent anesthesia complications

## 2011-05-09 NOTE — Op Note (Signed)
Date of procedure: 05/09/2011  Date of dictation: 05/09/2011  Service: Neurosurgery  Preoperative diagnosis: L2-3 instability with facet arthropathy. 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 Status post L4-S1 posterior lumbar fusion with instrumentation.  Postoperative diagnosis: Same  Procedure Name: L3-4 decompressive laminectomy with bilateral L3 and L4 decompressive foraminotomies, more than would be required for simple interbody fusion alone.  L3-4 posterior lumbar interbody fusion utilizing tangent interbody allograft wedge Telamon interbody peak Cage and local autograft.  Reexploration of L4-S1 posterior lumbar fusion with removal of segmental hardware.  L2 L3-L4 posterior lateral arthrodesis utilizing globus transitioned dynamic stabilization at L2-3 and rigid stabilization at L3-4.  Surgeon:Henry A.Pool, M.D.  Asst. Surgeon: Wynetta Emery  Anesthesia: General  Indication: Patient is a 40 year old male with a history of previous L4-S1 fusion. Patient presents now with back pain and neurogenic claudication secondary to lumbar stenosis at L3-4. Patient also has evidence of degeneration at L2-3 and early instability. Plan is for decompression and fusion at L3-4 and dynamic stabilization and fusion L2-3. This will require reexploring his previous lumbar fusion and revising this hardware. Patient is aware the risks and benefits and wishes to proceed.   Operative note: Patient was taken to the operating room and placed in optimal in the supine position. After an adequate level of anesthesia was achieved patient is positioned prone onto a Wilson frame and appropriately padded. The patient's lumbar region was prepped and draped sterilely. 10 blade is used to make a linear skin incision from L2 down to the sacrum. This is carried down sharply in the midline. A subperiosteal dissection performed exposing the lamina facet joints of L2 L3-L4 steer fusion at L4-5 and S1. The previously placed pedicle  screw fixation L45 and S1 was dissected free. This was disassembled and removed. The fusion was explored at L4-S1 and found to be quite solid at both levels. Transverse processes of L2-L3 and L4 were dissected free. Deep self-retaining retractors placed intraoperative fluoroscopy was used levels were confirmed. Decompressive laminectomy was then performed using a Armed forces operational officer and a high-speed drill. A complete laminectomy of L3 was performed. Complete facetectomies of L3 inferiorly and L4 superiorly were performed. All bone is clean and used to later on grafting. Ligamentum flavum and epidural scar were elevated and resected in piecemeal fashion using Kerrison rongeurs. Underlying thecal sac was identified. Wide decompressive foraminotomies were then performed along the exiting course of the L3 and L4 nerve roots bilaterally. Epidural venous plexus was quite related and cut. Bilateral discectomies were then performed at L3-4. Disc space and distracted up to 12 mm and a 12 mm millimeter distractor was left in the patient's left side. The thecal sac and nerve roots were protected on the right side. The disc space was reamed and then cut with 12 mm tangent instruments. Soft tissue was removed from the interspace. A 12 x 26 mm tangent wedge was then impacted in place and recessed approximately 2 mm from the posterior cortical margin of L3. Distractor was moved removed from the patient's left side. Disc spaces was again reamed and then cut with a 12 mm tangent his wrist. Soft tissue was then removed and interspace. Disc space was further curettage. Morselize autograft packed for later fusion. A 12 x 26 one or Telamon cage packed with morselized autograft was impacted into place and  recessed approximately 2 mm from the posterior cortical margin. Pedicles of L2-L3 and L4 were then identified using surface landmarks and intraoperative fluoroscopy. Superficial bone overlying the pedicle was then  removed using a high-speed drill. Pedicle was then probed using pedicle awl each pedicle awl track was then probed and found to be solidly within bone. Each pedicle awl track was then tapped with a 5.5 mm screw tap. Screw tap holes were probed and found to be solidly within bone. 6.5 x 40 mm globus pedicle screws were placed bilaterally at L2. 6.5 x 45 mm screws were placed bilaterally at L3 and 7.0 x 40 mm screws were placed bilaterally at L4. Transverse processes at L2-L3 and L4 were then decorticated using high-speed drill. Morselized autograft packed posterolaterally for later fusion. Short segment of titanium rod was trimmed connected to a lovastatin and make transitional device. Transitional device was measured and placed over the screw heads between L2 and L3. The intramural rod from L3 down to L4 was secured in the screw heads as well. The locking caps were applied. Locking catching and engaged with the construct between L3 and L4 under compression. Final images revealed good position of the bone graft and hardware at the proper operative level with normal alignment is fine. Wound is an area and one final time. Medium Hemovac drain was active left in the epidural space. Wounds and closed in layers of Vicryl suture. Bleeding was steady throughout the case the time of closure the wound hemostasis was good. Steri-Strips and sterile dressing were applied. There were no complications. Patient did well and he returns to the recovery room postop.

## 2011-05-09 NOTE — Anesthesia Preprocedure Evaluation (Addendum)
Anesthesia Evaluation  Patient identified by MRN, date of birth, ID band Patient awake    Reviewed: Allergy & Precautions, H&P , NPO status , Patient's Chart, lab work & pertinent test results, reviewed documented beta blocker date and time   Airway Mallampati: II  Neck ROM: full    Dental   Pulmonary          Cardiovascular hypertension,     Neuro/Psych PSYCHIATRIC DISORDERS    GI/Hepatic   Endo/Other    Renal/GU      Musculoskeletal   Abdominal   Peds  Hematology   Anesthesia Other Findings   Reproductive/Obstetrics                          Anesthesia Physical Anesthesia Plan  ASA: II  Anesthesia Plan: General   Post-op Pain Management:    Induction: Intravenous  Airway Management Planned: Oral ETT  Additional Equipment:   Intra-op Plan:   Post-operative Plan: Extubation in OR  Informed Consent: I have reviewed the patients History and Physical, chart, labs and discussed the procedure including the risks, benefits and alternatives for the proposed anesthesia with the patient or authorized representative who has indicated his/her understanding and acceptance.   Dental advisory given  Plan Discussed with: CRNA, Surgeon and Anesthesiologist  Anesthesia Plan Comments:        Anesthesia Quick Evaluation

## 2011-05-09 NOTE — Brief Op Note (Signed)
05/09/2011  5:47 PM  PATIENT:  [name]  40 y.o. male  PRE-OPERATIVE DIAGNOSIS:  Degenerative Disc Disease,Stenosis  POST-OPERATIVE DIAGNOSIS:  Degenerative Disc Disease,Stenosis  PROCEDURE:  Procedure(s): POSTERIOR LUMBAR FUSION 3 LEVEL  SURGEON:  Surgeon(s): Sherilyn Cooter A Pool Mariam Dollar  PHYSICIAN ASSISTANT:   ASSISTANTS: C RAM   ANESTHESIA:   general  EBL:  Total I/O In: 4310 [I.V.:2400; Blood:910; IV Piggyback:1000] Out: 2415 [Urine:415; Blood:2000]  BLOOD ADMINISTERED:none and   CC CELLSAVER  DRAINS: ( ) Hemovact drain(s) in the   with  Suction Open   LOCAL MEDICATIONS USED:  MARCAINE 30CC  SPECIMEN:  No Specimen  DISPOSITION OF SPECIMEN:  N/A  COUNTS:  YES  TOURNIQUET:  * No tourniquets in log *  DICTATION: .Dragon Dictation  PLAN OF CARE: Admit to inpatient   PATIENT DISPOSITION:  PACU - hemodynamically stable.   Delay start of Pharmacological VTE agent (>24hrs) due to surgical blood loss or risk of bleeding: yes

## 2011-05-09 NOTE — Preoperative (Signed)
Beta Blockers   Reason not to administer Beta Blockers:Not Applicable 

## 2011-05-09 NOTE — Transfer of Care (Signed)
Immediate Anesthesia Transfer of Care Note  Patient: [name]  Procedure(s) Performed:  POSTERIOR LUMBAR FUSION 3 LEVEL - RE-EXPLORATION OF L4-5, FUSION, REMOVAL OF HARDWARE, L3-4 DLL, PLIF, TANGENT, TELAMON CAGE, L2-4 PLA W/GLOBUS TRANSITIONAL PEDICLE SCREW FIXATION  Patient Location: PACU  Anesthesia Type: General  Level of Consciousness: awake, alert , oriented and patient cooperative  Airway & Oxygen Therapy: Patient Spontanous Breathing and Patient connected to nasal cannula oxygen  Post-op Assessment: Report given to PACU RN, Post -op Vital signs reviewed and stable and Patient moving all extremities X 4  Post vital signs: Reviewed and stable  Complications: No apparent anesthesia complications

## 2011-05-09 NOTE — Progress Notes (Signed)
ANTIBIOTIC CONSULT NOTE - INITIAL  Pharmacy Consult for   Vancomycin Indication:  Prophylaxis s/p lumbar surgery  Allergies  Allergen Reactions  . Lisinopril Anaphylaxis  . Penicillins Hives    Patient Measurements: Height: 5\' 8"  (172.7 cm) Weight: 257 lb 0.9 oz (116.6 kg) IBW/kg (Calculated) : 68.4  Dosing Weight: 116 kg  Vital Signs: Temp: 98.9 F (37.2 C) (11/12 2100) Temp src: Oral (11/12 2100) BP: 129/78 mmHg (11/12 2100) Pulse Rate: 91  (11/12 2100)    Labs:   No chemistries pre-op, last Scr 1.07 in June.   Estimated crcl ~90 ml/min    Microbiology: Recent Results (from the past 720 hour(s))  SURGICAL PCR SCREEN     Status: Abnormal   Collection Time   05/04/11 10:04 AM      Component Value Range Status Comment   MRSA, PCR NEGATIVE  NEGATIVE  Final    Staphylococcus aureus POSITIVE (*) NEGATIVE  Final     Medical History: Past Medical History  Diagnosis Date  . Hypertension   . Arthritis   . Anxiety   . Depression     Medications:     Amlodipine 10 mg daily, Atenolo 25 mg daily, Neurontin 800 mg TID,  HCTZ 25 mg daily,  MS Contin 60 mg q12hrs,  Zoloft 200 mg daily,  Trazodone 100 mg qhs  Assessment:     Penicillin allergy, using Vancomyin for prophlyaxis.     Vancomycin 1 gram IV given pre-op at 12:51 pm today.      Goal of Therapy:  Vancomycin trough level 10-15 mcg/ml  Plan:    Will continue Vancomycin with 1 gram IV q12hrs.   Per protocol, drain in;  Vanc to continue until drain is removed or Vanc dc'd by surgeon.  Dennie Fetters 05/09/2011,10:11 PM

## 2011-05-09 NOTE — Interval H&P Note (Signed)
History and Physical Interval Note:   05/09/2011   12:45 PM   [name]  has presented today for surgery, with the diagnosis of DDD/STENOSIS  The various methods of treatment have been discussed with the patient and family. After consideration of risks, benefits and other options for treatment, the patient has consented to  Procedure(s): POSTERIOR LUMBAR FUSION 3 LEVEL as a surgical intervention .  The patients' history has been reviewed, patient examined, no change in status, stable for surgery.  I have reviewed the patients' chart and labs.  Questions were answered to the patient's satisfaction.     Julio Sicks A  MD

## 2011-05-10 LAB — BASIC METABOLIC PANEL
BUN: 11 mg/dL (ref 6–23)
CO2: 26 mEq/L (ref 19–32)
Calcium: 8.4 mg/dL (ref 8.4–10.5)
Chloride: 105 mEq/L (ref 96–112)
Creatinine, Ser: 0.77 mg/dL (ref 0.50–1.35)
GFR calc Af Amer: >90 mL/min (ref >90)
GFR calc non Af Amer: >90 mL/min (ref >90)
Glucose, Bld: 144 mg/dL — ABNORMAL HIGH (ref 70–99)
Potassium: 3.8 mEq/L (ref 3.5–5.1)
Sodium: 139 mEq/L (ref 135–145)

## 2011-05-10 LAB — CBC
HCT: 28.4 % — ABNORMAL LOW (ref 39.0–52.0)
Hemoglobin: 9.8 g/dL — ABNORMAL LOW (ref 13.0–17.0)
MCH: 30.9 pg (ref 26.0–34.0)
MCHC: 34.5 g/dL (ref 30.0–36.0)
MCV: 89.6 fL (ref 78.0–100.0)
Platelets: 140 10*3/uL — ABNORMAL LOW (ref 150–400)
RBC: 3.17 MIL/uL — ABNORMAL LOW (ref 4.22–5.81)
RDW: 14.1 % (ref 11.5–15.5)
WBC: 10.4 10*3/uL (ref 4.0–10.5)

## 2011-05-10 MED ORDER — GABAPENTIN 400 MG PO CAPS
800.0000 mg | ORAL_CAPSULE | Freq: Three times a day (TID) | ORAL | Status: DC
  Administered 2011-05-10 – 2011-05-11 (×4): 800 mg via ORAL
  Filled 2011-05-10 (×5): qty 2

## 2011-05-10 MED ORDER — MORPHINE SULFATE CR 60 MG PO TB12
60.0000 mg | ORAL_TABLET | Freq: Three times a day (TID) | ORAL | Status: DC
  Filled 2011-05-10: qty 1

## 2011-05-10 MED ORDER — MORPHINE SULFATE CR 60 MG PO TB12
60.0000 mg | ORAL_TABLET | Freq: Three times a day (TID) | ORAL | Status: DC
  Administered 2011-05-10 – 2011-05-11 (×3): 60 mg via ORAL
  Filled 2011-05-10 (×2): qty 1

## 2011-05-10 MED ORDER — OXYCODONE HCL 5 MG PO TABS
15.0000 mg | ORAL_TABLET | ORAL | Status: DC | PRN
  Administered 2011-05-10 – 2011-05-11 (×6): 15 mg via ORAL
  Filled 2011-05-10 (×6): qty 3

## 2011-05-10 NOTE — Progress Notes (Signed)
  Subjective: Patient reports Postop day 1 . no lower extremity pain. No complaints of weakness or sensory loss. Back pain improved. No orthostasis.  Objective: Vital signs in last 24 hours: Temp:  [97.8 F (36.6 C)-99.2 F (37.3 C)] 97.8 F (36.6 C) (11/13 1020) Pulse Rate:  [80-100] 87  (11/13 1020) Resp:  [14-25] 18  (11/13 1200) BP: (114-136)/(58-79) 136/61 mmHg (11/13 1020) SpO2:  [94 %-100 %] 100 % (11/13 1020) Weight:  [116.6 kg (257 lb 0.9 oz)] 257 lb 0.9 oz (116.6 kg) (11/12 2100)  Intake/Output from previous day: 11/12 0701 - 11/13 0700 In: 5510 [I.V.:3100; Blood:910; IV Piggyback:1500] Out: 4890 [Urine:1940; Drains:950; Blood:2000] Intake/Output this shift: Total I/O In: -  Out: 150 [Drains:150]  Awake alert oriented and appropriate motor and sensory examination of the extremities is normal. Dressing clean dry and intact. Abdomen soft. Heart regular rate and rhythm.  Lab Results: No results found for this basename: WBC:2,HGB:2,HCT:2,PLT:2 in the last 72 hours BMET No results found for this basename: NA:2,K:2,CL:2,CO2:2,GLUCOSE:2,BUN:2,CREATININE:2,CALCIUM:2 in the last 72 hours  Studies/Results: Dg Lumbar Spine 2-3 Views  05/09/2011  *RADIOLOGY REPORT*  Clinical Data: Lumbar fusion. Hardware removal.  L3-4 fusion  LUMBAR SPINE - 2-3 VIEW  Comparison: MRI 08/31/2010  Findings: AP and lateral C-arm images were  obtained of the lumbar spine.  Due to limited field of view and limited bony detail, I am not able to accurately evaluate lumbar fusion levels.  It appears that the L5 screws have been removed.  There remain pedicle screw interbody fusion at L4-5.  This has been placement of pedicle screws at L 3  IMPRESSION: Limited study.  Follow-up radiographs are suggested to evaluate hardware positioning.  Original Report Authenticated By: Camelia Phenes, M.D.   Dg C-arm 61-120 Min  05/09/2011  CLINICAL DATA: lumbar fusion   C-ARM 61-120 MINUTES  Fluoroscopy was utilized by  the requesting physician.  No radiographic  interpretation.      Assessment/Plan: Doing well postop. Mobilize as tolerated. Check [name] followup CBC. Discontinue PCA.  LOS: 1 day     [name],[name] 05/10/2011, 1:39 PM

## 2011-05-10 NOTE — Plan of Care (Signed)
Problem: Consults Goal: Diagnosis - Spinal Surgery Outcome: Completed/Met Date Met:  05/10/11 [place]/Lumbar Spine Fusion

## 2011-05-10 NOTE — Progress Notes (Signed)
Physical Therapy Evaluation Patient Details Name: [name] MRN: [id number] DOB: February 07, 1971 Today's Date: 05/10/2011  Problem List:  Patient Active Problem List  Diagnoses  . Lumbar stenosis with neurogenic claudication    Past Medical History:  Past Medical History  Diagnosis Date  . Hypertension   . Arthritis   . Anxiety   . Depression    Past Surgical History:  Past Surgical History  Procedure Date  . Back surgery     5 back surgeries  . Spinal cord stimulator implant   . Nasal sinus surgery     PT Assessment/Plan/Recommendation PT Assessment Clinical Impression Statement: Pt would benefit from physical therapy while in the acute setting to increase functional mobility to prepare for safe d/c home. PT Recommendation/Assessment: Patient will need skilled PT in the acute care venue PT Problem List: Decreased activity tolerance;Decreased balance;Decreased mobility;Decreased strength;Decreased knowledge of use of DME;Decreased knowledge of precautions Barriers to Discharge: None PT Therapy Diagnosis : Difficulty walking;Abnormality of gait;Generalized weakness;Acute pain PT Plan PT Frequency: Min 5X/week PT Treatment/Interventions: DME instruction;Gait training;Stair training;Functional mobility training;Balance training;Patient/family education PT Recommendation Follow Up Recommendations: None Equipment Recommended: Cane (Single point cane)   PT Goals  Acute Rehab PT Goals PT Goal Formulation: With patient Time For Goal Achievement: 7 days Pt will Roll Supine to Right Side: with modified independence PT Goal: Rolling Supine to Right Side - Progress: Not met Pt will go Supine/Side to Sit: with modified independence PT Goal: Supine/Side to Sit - Progress: Not met Pt will Transfer Sit to Stand/Stand to Sit: with modified independence PT Transfer Goal: Sit to Stand/Stand to Sit - Progress: Not met Pt will Transfer Bed to Chair/Chair to Bed: with modified  independence PT Transfer Goal: Bed to Chair/Chair to Bed - Progress: Not met Pt will Ambulate: >150 feet;with modified independence;with least restrictive assistive device;with gait velocity >(comment) ft/second (>1.8 ft/sec) PT Goal: Ambulate - Progress: Not met Pt will Go Up / Down Stairs: 3-5 stairs;with least restrictive assistive device;with modified independence PT Goal: Up/Down Stairs - Progress: Not met Additional Goals Additional Goal #1: Pt will be able to verbalize back precautions when asked. PT Goal: Additional Goal #1 - Progress: Not met  PT Evaluation Precautions/Restrictions  Precautions Precautions: Back Required Braces or Orthoses: Yes Spinal Brace: Lumbar corset;Applied in sitting position Restrictions Weight Bearing Restrictions: No  Pt reports 6/10 pain in back.  Pt repositioned and utilized PCA.  Prior Functioning  Home Living Lives With: Spouse Receives Help From: Family Type of Home: House Home Layout: Two level;Able to live on main level with bedroom/bathroom Home Access: Stairs to enter Entrance Stairs-Rails: Can reach both Entrance Stairs-Number of Steps: 3 Bathroom Shower/Tub: Tub/shower unit (With shower seat) Bathroom Toilet: Standard (With counter in front of toilet he can use for support) Bathroom Accessibility: Yes Home Adaptive Equipment: Built-in shower seat;Straight cane Additional Comments: Pt lives in 2 story house, but reports everything he needs is on the 1st floor. Prior Function Level of Independence: Needs assistance with ADLs;Independent with gait;Independent with transfers;Needs assistance with homemaking Bath: Moderate (Some days wife would A him in stepping over side of tub) Dressing: Moderate (Wife provides A putting on pants) Laundry: Moderate Homemaking Assistance Comments: Pt reports he did much of the housework, due to wife working.  Pt was able to vaccuum and perform other chores with increased time.  Pt was able to do yard  work with increased time. Able to Take Stairs?: Yes Driving: Yes Vocation: Unemployed Comments: Pt reports he  has had limited function since his first back surgery in 2005. Cognition Cognition Arousal/Alertness: Awake/alert Overall Cognitive Status: Appears within functional limits for tasks assessed Orientation Level: Oriented X4 Sensation/Coordination Sensation Light Touch: Impaired Detail Light Touch Impaired Details: Impaired RLE (Decreased sensation on R versus L lateral-distal LE. ) Stereognosis: Not tested Hot/Cold: Not tested Proprioception: Not tested Additional Comments: Pt reports RLE has had decreased sensation for awhile, not a new problem.  Pt reports sensation has actually increased over time, although still diminished with testing. Extremity Assessment RUE Assessment RUE Assessment: Within Functional Limits LUE Assessment LUE Assessment: Within Functional Limits RLE Assessment RLE Assessment: Within Functional Limits LLE Assessment LLE Assessment: Within Functional Limits Mobility (including Balance) Bed Mobility Bed Mobility: Yes Rolling Right: 5: Supervision Rolling Right Details (indicate cue type and reason): Verbal cues to maintain back precautions.  A to organize lines and leads prior to initiating movement. Right Sidelying to Sit: 4: Min assist Right Sidelying to Sit Details (indicate cue type and reason): Min A to complete movement of UE and trunk, while maintaining back precautions.  Verbal cues for hand placement. Sitting - Scoot to Edge of Bed: 5: Supervision Sitting - Scoot to Edge of Bed Details (indicate cue type and reason): Supervision for pt safety. Transfers Transfers: Yes Sit to Stand: 4: Min assist;With upper extremity assist;From bed;From chair/3-in-1 Sit to Stand Details (indicate cue type and reason): A to initiate movement while maintaining back precautions.  Verbal cues for hand placement. Stand to Sit: 4: Min assist;With upper extremity  assist;To chair/3-in-1 Stand to Sit Details: Min A for eccentric control.  Verbal cues to avoid back flexion during task. Stand Pivot Transfers: 4: Min assist Stand Pivot Transfer Details (indicate cue type and reason): Min A for pt safety to maintain balance and precautions.  Verbal instructions for UE and LE placement. Ambulation/Gait Ambulation/Gait: Yes (Began c quad cane, then switched to IV pole for support ) Ambulation/Gait Assistance: 4: Min assist Ambulation/Gait Assistance Details (indicate cue type and reason): Min A for pt balance and safety.   Ambulation Distance (Feet): 300 Feet Assistive device: Small based quad cane;Other (Comment) (IV pole) Gait Pattern: Decreased step length - right;Decreased step length - left;Shuffle Gait velocity: Decreased gait speed, but not measured. Stairs: No Wheelchair Mobility Wheelchair Mobility: No  Posture/Postural Control Posture/Postural Control: No significant limitations Balance Balance Assessed: Yes Static Sitting Balance Static Sitting - Balance Support: Feet supported;No upper extremity supported Static Sitting - Level of Assistance: 5: Stand by assistance Static Sitting - Comment/# of Minutes: 5 (Pt sat at EOB while sensation was assessed and brace donned) Static Standing Balance Static Standing - Balance Support: No upper extremity supported;During functional activity Static Standing - Level of Assistance: 5: Stand by assistance Static Standing - Comment/# of Minutes: 2 (Pt stood for brace adjustment and while drinking) Exercise    End of Session PT - End of Session Equipment Utilized During Treatment: Gait belt;Back brace Activity Tolerance: Patient tolerated treatment well Patient left: in chair;with call bell in reach Nurse Communication: Mobility status for ambulation General Behavior During Session: [facility] for tasks performed Cognition: [facility] for tasks performed  Lacinda Axon 05/10/2011, 11:11 AM  05/10/2011 Cephus Shelling, PT, DPT [phone]

## 2011-05-11 MED ORDER — OXYMORPHONE HCL ER 20 MG PO TB12: ORAL_TABLET | ORAL | Status: DC

## 2011-05-11 MED ORDER — OXYCODONE HCL 15 MG PO TABS: 15.0000 mg | ORAL_TABLET | ORAL | Status: AC | PRN

## 2011-05-11 MED ORDER — DIAZEPAM 5 MG PO TABS: 5.0000 mg | ORAL_TABLET | Freq: Four times a day (QID) | ORAL | Status: AC | PRN

## 2011-05-11 NOTE — Progress Notes (Signed)
Occupational Therapy Evaluation Patient Details Name: [name] MRN: [id number] DOB: [birth year] Today's Date: 05/11/2011 EV 2  [id number] Problem List:  Patient Active Problem List  Diagnoses  . Lumbar stenosis with neurogenic claudication    Past Medical History:  Past Medical History  Diagnosis Date  . Hypertension   . Arthritis   . Anxiety   . Depression    Past Surgical History:  Past Surgical History  Procedure Date  . Back surgery     5 back surgeries  . Spinal cord stimulator implant   . Nasal sinus surgery     OT Assessment/Plan/Recommendation OT Assessment Clinical Impression Statement: Pt s/p lumbar back fusion and diskectomy who is overall min assist with LE adls w/o equipment and S to Independent with equipment.  Pt is not in need of further OT at this time.  All education completed. OT Recommendation/Assessment: Patient does not need any further OT services OT Recommendation Equipment Recommended: Tub/shower seat;Cane;Other (comment) (adaptive equipment pack w reacher/sock aid) OT Goals    OT Evaluation Precautions/Restrictions  Precautions Precautions: Back Precaution Booklet Issued: Yes (comment) Precaution Comments: Pt recalled 3/3 back precautions Required Braces or Orthoses: Yes Spinal Brace: Lumbar corset;Applied in sitting position;Other (comment) Spinal Brace Comments: Pt independent donning brace.  Cues to wear tighter Restrictions Weight Bearing Restrictions: No Prior Functioning Home Living Lives With: Spouse Receives Help From: Family Type of Home: House Home Layout: Two level;Able to live on main level with bedroom/bathroom Home Access: Stairs to enter Entrance Stairs-Rails: Can reach both Entrance Stairs-Number of Steps: 3 Bathroom Shower/Tub: Engineer, manufacturing systems: Standard Bathroom Accessibility: Yes How Accessible: Accessible via walker Additional Comments: Pt lives in 2 story house, but reports everything he  needs is on the 1st floor. Prior Function Level of Independence: Needs assistance with ADLs;Needs assistance with homemaking;Independent with gait;Requires assistive device for independence Bath: Moderate Dressing: Moderate Laundry: Moderate Homemaking Assistance Comments: Pt reports he did much of the housework, due to wife working.  Pt was able to vaccuum and perform other chores with increased time.  Pt was able to do yard work with increased time. Able to Take Stairs?: Yes Driving: Yes Vocation: Unemployed ADL ADL Eating/Feeding: Performed;Independent Where Assessed - Eating/Feeding: Chair Grooming: Performed;Wash/dry hands;Independent Where Assessed - Grooming: Standing at sink Upper Body Bathing: Simulated;Set up Where Assessed - Upper Body Bathing: Sitting at sink Lower Body Bathing: Simulated;Moderate assistance Lower Body Bathing Details (indicate cue type and reason): assist with reaching below the knees Where Assessed - Lower Body Bathing: Sit to stand from chair Upper Body Dressing: Performed;Set up;Other (comment) (cues to wear brace tighter) Where Assessed - Upper Body Dressing: Sitting, bed Lower Body Dressing: Performed;Minimal assistance;Other (comment) (introduced to reacher and sock aid) Where Assessed - Lower Body Dressing: Sit to stand from chair Toilet Transfer: Performed;Independent Toilet Transfer Method: Proofreader: Doctor, general practice - Clothing Manipulation: Performed;Independent Where Assessed - Toileting Clothing Manipulation: Standing Toileting - Hygiene: Independent Where Assessed - Toileting Hygiene: Standing Tub/Shower Transfer: Not assessed Tub/Shower Transfer Method: Not assessed Equipment Used: Reacher;Sock aid ADL Comments: discussed use of adaptive equimpent for LE adls.  Pt told he can get this in gift store at [facility] Vision/Perception    Cognition   Sensation/Coordination   Extremity Assessment RUE  Assessment RUE Assessment: Within Functional Limits LUE Assessment LUE Assessment: Within Functional Limits Mobility  Bed Mobility Bed Mobility: Yes Rolling Right: 7: Independent;With rail Right Sidelying to Sit: 7: Independent;With rails Sitting - Scoot to  Edge of Bed: 7: Independent Transfers Transfers: Yes Sit to Stand: 7: Independent;From bed Stand to Sit: 7: Independent;To chair/3-in-1 Exercises   End of Session OT - End of Session Equipment Utilized During Treatment: Back brace Activity Tolerance: Patient tolerated treatment well Patient left: in chair;with call bell in reach Nurse Communication: Mobility status for ambulation General Behavior During Session: [facility] for tasks performed Cognition: [facility] for tasks performed   Hope Budds    [phone]    05/11/2011, 10:57 AM

## 2011-05-11 NOTE — Progress Notes (Signed)
Physical Therapy Treatment Patient Details Name: [name] MRN: [id number] DOB: March 29, 1971 Today's Date: 05/11/2011  PT Assessment/Plan  PT - Assessment/Plan Comments on Treatment Session: Pt reports 8/10 pain in back with pain radiating toward bilateral hips.  Pt was premedicated and was repositioned after treatment. PT Plan: Discharge plan remains appropriate;Frequency remains appropriate PT Frequency: Min 5X/week Follow Up Recommendations: None Equipment Recommended: Cane PT Goals  Acute Rehab PT Goals PT Goal Formulation: With patient Time For Goal Achievement: 7 days Pt will Roll Supine to Right Side: with modified independence PT Goal: Rolling Supine to Right Side - Progress: Progressing toward goal Pt will go Supine/Side to Sit: with modified independence PT Goal: Supine/Side to Sit - Progress: Progressing toward goal Pt will Transfer Sit to Stand/Stand to Sit: with modified independence PT Transfer Goal: Sit to Stand/Stand to Sit - Progress: Progressing toward goal Pt will Transfer Bed to Chair/Chair to Bed: with modified independence PT Transfer Goal: Bed to Chair/Chair to Bed - Progress: Progressing toward goal Pt will Ambulate: >150 feet;with modified independence;with least restrictive assistive device;with gait velocity >(comment) ft/second PT Goal: Ambulate - Progress: Progressing toward goal Pt will Go Up / Down Stairs: 3-5 stairs;with least restrictive assistive device;with modified independence PT Goal: Up/Down Stairs - Progress: Progressing toward goal Additional Goals Additional Goal #1: Pt will be able to verbalize back precautions when asked. PT Goal: Additional Goal #1 - Progress: Progressing toward goal  PT Treatment Precautions/Restrictions  Precautions Precautions: Back Precaution Booklet Issued: Yes (comment) (Handout given on evaluation.) Precaution Comments: Pt able to recall 2/3 back precautions with verbal cues for 3/3. Required Braces or  Orthoses: Yes Spinal Brace: Lumbar corset;Applied in sitting position;Other (comment) (Brace donned prior to PT session by patient.) Restrictions Weight Bearing Restrictions: No Mobility (including Balance) Bed Mobility Bed Mobility: No Rolling Right: Not tested (comment) (Pt OOB at sink upon PT arrival.) Right Sidelying to Sit: Not tested (comment) (Pt OOB at sink upon PT arrival.) Sitting - Scoot to Edge of Bed: Not tested (comment) (Pt OOB at sink upon PT arrival.) Transfers Transfers: No Sit to Stand: Not tested (comment) (Pt standing at sink upon PT arrival.) Stand to Sit: Not tested (comment) (Pt left standing at sink after PT session.) Stand Pivot Transfers: Not tested (comment) Ambulation/Gait Ambulation/Gait: Yes Ambulation/Gait Assistance: 5: Supervision Ambulation/Gait Assistance Details (indicate cue type and reason): Verbal cues for extended trunk and safety.  Closer supervision required at end of distance secondary to fatigue. Ambulation Distance (Feet): 400 Feet Assistive device: None Gait Pattern: Decreased step length - right;Decreased step length - left;Shuffle;Trunk flexed (Trunk flexed at hips.) Gait velocity: Decreased gait speed (not measured) Stairs: No (Pt politely declined.) Wheelchair Mobility Wheelchair Mobility: No  Posture/Postural Control Posture/Postural Control: No significant limitations Balance Balance Assessed: No Exercise    End of Session PT - End of Session Equipment Utilized During Treatment: Back brace Activity Tolerance: Patient tolerated treatment well Patient left: with call bell in reach;with family/visitor present Nurse Communication: Mobility status for ambulation General Behavior During Session: [facility] for tasks performed Cognition: [facility] for tasks performed  Cephus Shelling 05/11/2011, 9:17 AM  05/11/2011 Cephus Shelling, PT, DPT [phone]

## 2011-05-11 NOTE — Discharge Summary (Signed)
Physician Discharge Summary  Patient ID: [name] MRN: [id number] DOB/AGE: 1970-10-13 40 y.o.  Admit date: 05/09/2011 Discharge date: 05/11/2011  Admission Diagnoses:  Discharge Diagnoses:  Principal Problem:  *Lumbar stenosis with neurogenic claudication   Discharged Condition: good  Hospital Course: Patient admitted or treatment of this symptomatic lumbar stenosis. Patient underwent an uncomplicated decompression and fusion. Postoperative patient is done well. Preoperative back and lower extremity pain much improved. Wound healing well. Tolerating a regular diet.  Consults: none  Significant Diagnostic Studies: None  Treatments: surgery: L3-4 posterior lumbar my fusion utilizing tangent interbody allograft wedge Telamon cage L2-L4 posterior lateral lumbar fusion with dynamic instrumentation at L2-3 and rigid instrumentation at L3-4.  Discharge Exam: Blood pressure 120/74, pulse 89, temperature 97.9 F (36.6 C), temperature source Oral, resp. rate 20, height 5\' 8"  (1.727 m), weight 116.6 kg (257 lb 0.9 oz), SpO2 97.00%. Awake alert oriented and appropriate. Motor and sensory examination intact wound healing well. Abdomen soft nontender chest clear to auscultation.  Disposition: Home or Self Care  Discharge Orders    Future Orders Please Complete By Expires   Shower chair      Cane adjustable single point        Current Discharge Medication List    START taking these medications   Details  diazepam (VALIUM) 5 MG tablet Take 1-2 tablets (5-10 mg total) by mouth every 6 (six) hours as needed. Qty: 60 tablet, Refills: 1      CONTINUE these medications which have CHANGED   Details  oxyCODONE (ROXICODONE) 15 MG immediate release tablet Take 1 tablet (15 mg total) by mouth every 4 (four) hours as needed (pain). Qty: 90 tablet, Refills: 0    oxymorphone (OPANA ER) 20 MG 12 hr tablet 1-2 po every 8 Hours Qty: 120 tablet, Refills: 0      CONTINUE these medications  which have NOT CHANGED   Details  amLODipine (NORVASC) 10 MG tablet Take 10 mg by mouth every morning.      atenolol (TENORMIN) 25 MG tablet Take 25 mg by mouth daily.      gabapentin (NEURONTIN) 800 MG tablet Take 800 mg by mouth 3 (three) times daily.      hydrochlorothiazide (HYDRODIURIL) 25 MG tablet Take 25 mg by mouth every morning.      sertraline (ZOLOFT) 100 MG tablet Take 200 mg by mouth every morning.      traZODone (DESYREL) 100 MG tablet Take 100 mg by mouth at bedtime.        STOP taking these medications     acetaminophen (TYLENOL) 100 MG/ML solution          Signed: POOL,HENRY A 05/11/2011, 1:27 PM

## 2011-05-13 LAB — POCT I-STAT 4, (NA,K, GLUC, HGB,HCT)
Glucose, Bld: 156 mg/dL — ABNORMAL HIGH (ref 70–99)
HCT: 32 % — ABNORMAL LOW (ref 39.0–52.0)
Hemoglobin: 10.9 g/dL — ABNORMAL LOW (ref 13.0–17.0)
Potassium: 4 mEq/L (ref 3.5–5.1)
Sodium: 137 mEq/L (ref 135–145)

## 2011-06-07 MED FILL — Sodium Chloride IV Soln 0.9%: INTRAVENOUS | Qty: 1000 | Status: AC

## 2011-06-07 MED FILL — Heparin Sodium (Porcine) Inj 1000 Unit/ML: INTRAMUSCULAR | Qty: 30 | Status: AC

## 2011-06-07 MED FILL — Sodium Chloride Irrigation Soln 0.9%: Qty: 3000 | Status: AC

## 2011-06-08 ENCOUNTER — Other Ambulatory Visit: Payer: Self-pay | Admitting: Neurosurgery

## 2011-06-08 ENCOUNTER — Ambulatory Visit
Admission: RE | Admit: 2011-06-08 | Discharge: 2011-06-08 | Disposition: A | Discharge status: Home / Self Care | Payer: 59 | Source: Ambulatory Visit | Attending: Neurosurgery | Admitting: Neurosurgery

## 2011-06-08 DIAGNOSIS — M545 Low back pain, unspecified: Secondary | ICD-10-CM

## 2011-09-15 ENCOUNTER — Ambulatory Visit
Admission: RE | Admit: 2011-09-15 | Discharge: 2011-09-15 | Disposition: A | Discharge status: Home / Self Care | Payer: 59 | Source: Ambulatory Visit | Attending: Neurosurgery | Admitting: Neurosurgery

## 2011-09-15 ENCOUNTER — Other Ambulatory Visit: Payer: Self-pay | Admitting: Neurosurgery

## 2011-09-15 DIAGNOSIS — M545 Low back pain, unspecified: Secondary | ICD-10-CM

## 2011-12-02 ENCOUNTER — Ambulatory Visit (INDEPENDENT_AMBULATORY_CARE_PROVIDER_SITE_OTHER): Payer: 59 | Admitting: Emergency Medicine

## 2011-12-02 VITALS — BP 113/75 | HR 78 | Temp 98.2°F | Resp 16 | Ht 68.0 in | Wt 254.8 lb

## 2011-12-02 DIAGNOSIS — J029 Acute pharyngitis, unspecified: Secondary | ICD-10-CM

## 2011-12-02 LAB — POCT RAPID STREP A (OFFICE): Rapid Strep A Screen: NEGATIVE

## 2011-12-02 MED ORDER — FIRST-DUKES MOUTHWASH MT SUSP: OROMUCOSAL | Status: DC

## 2011-12-02 MED ORDER — AZITHROMYCIN 250 MG PO TABS: ORAL_TABLET | ORAL | Status: AC

## 2011-12-02 NOTE — Progress Notes (Signed)
  Subjective:    Patient ID: [name], male    DOB: June 11, 1971, 41 y.o.   MRN: [id number]  HPI patient just back from a cruise. In the [place]. He is here with a severe sore throat. He was exposed to strep on his cruise    Review of Systems     Objective:   Physical Exam alert cooperative male in no distress. TMs show scarring bilaterally. The nose is normal without drainage. The throat is red and inflamed. Neck is supple there are tender anterior cervical nodes. abdomen soft and nontender.  Results for orders placed in visit on 12/02/11  POCT RAPID STREP A (OFFICE)      Component Value Range   Rapid Strep A Screen Negative  Negative         Assessment & Plan:  Patient here with sore throat and strep exposure. Strep screen

## 2013-04-08 ENCOUNTER — Encounter (INDEPENDENT_AMBULATORY_CARE_PROVIDER_SITE_OTHER): Payer: Self-pay

## 2013-04-08 ENCOUNTER — Encounter: Payer: Self-pay | Admitting: Neurology

## 2013-04-08 ENCOUNTER — Ambulatory Visit (INDEPENDENT_AMBULATORY_CARE_PROVIDER_SITE_OTHER): Payer: 59 | Admitting: Neurology

## 2013-04-08 VITALS — BP 122/83 | HR 80 | Resp 16 | Ht 68.0 in | Wt 260.0 lb

## 2013-04-08 DIAGNOSIS — R0609 Other forms of dyspnea: Secondary | ICD-10-CM

## 2013-04-08 DIAGNOSIS — R0989 Other specified symptoms and signs involving the circulatory and respiratory systems: Secondary | ICD-10-CM

## 2013-04-08 DIAGNOSIS — M2619 Other specified anomalies of jaw-cranial base relationship: Secondary | ICD-10-CM

## 2013-04-08 DIAGNOSIS — R0683 Snoring: Secondary | ICD-10-CM

## 2013-04-08 DIAGNOSIS — T4275XA Adverse effect of unspecified antiepileptic and sedative-hypnotic drugs, initial encounter: Secondary | ICD-10-CM

## 2013-04-08 HISTORY — DX: Other specified anomalies of jaw-cranial base relationship: M26.19

## 2013-04-08 NOTE — Progress Notes (Addendum)
[facility]  Provider:  Melvyn Novas, M D  Referring Provider: No ref. provider found Primary Care Physician:  Aura Dials, MD  Chief Complaint  Patient presents with  . Fatigue    Pt has been having sleep issues at this time and wants to be eval'ed at this time for it. Pt had vision screening  as well. Pt wears contacts full-time.Rt eye 20/20 Lt eye 20/30.    HPI:  [name] is a 42 y.o. male  Is seen here as a referral/ revisit  from Dr. Done Hodgkins, for sleep consultation.    [name] is seen here today upon request of Dr. Renae Fickle and Dr. 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  Diagnoses have been entered 2012 by Dr. Julio Sicks - He has undergone injections by Dr. Ollen Bowl and trigger point injections. Transforaminal epidural injections have reduced his pain by about 30-40% in the right leg , not on the left . Dr. Curnow Hodgkins has also discussed with the  patient has been maintained chronically on opiate therapy.  An order from 03-21-13 indicate that the patient is on Percocet 5/325 mg , 8 times per day- this was still suboptimal analgesia, according to Dr. Culton Hodgkins.  Is not having any excessive daytime sleepiness but Dr. Chari Manning concern is that the narcotic pain medications taken up into the late afternoon or evening hours have posed an increased risk of morbidity and mortality ,   Today's Epworth Sleepiness Scale is endorsed at 3 points, this fatigue severity scale is endorsed at 16 points, a depression score was not obtained but the patient is in treatment for anxiety and depression with Dr. Evelene Croon.  The patient normally goes to bed around 11 to 11:30 PM, now vertical taken 15-20 minutes to fall asleep. He rises in the morning at about 7 AM when his wife has to arise. Most mornings he will wake spontaneously but his wife uses an alarm clock. Has no nocturia. The patient struggled at night to  find a comfortable position that will  not cause  him additional pain. He wakes up from pain and pain will also keep him from sleeping of falling asleep again quickly. He estimates an average of 6 hours a night of sleep. He wakes up in the morning unrefreshed unrestored. The patient does not take naps in the daytime. He does not use any caffeine in daytime. He uses decongestant an nasal spray, and a bite guard for bruxism. ( ( used since  2012).   He added that he takes adderall , and baby sits his new born granddaughter . He picks her up at 7 AM and her parents pick her up at 3.30 PM.   There is not family history of sleep disorders.   Epworth 3 , FSS 16 .  The patient uses a spinal cord pain stimulator, Dr Gerlene Fee 2012.         Review of Systems: Out of a complete 14 system review, the patient complains of only the following symptoms, and all other reviewed systems are negative.   Pain, chronic back and  Radicular pain.  Work related back injury 20085, status post 6 back surgeries.      History   Social History  . Marital Status: Married    Spouse Name: N/A    Number of Children: N/A  . Years of Education: 13   Occupational History  . disabled  since 2005   Social History Main Topics  . Smoking status: Never Smoker   . Smokeless tobacco: Not on file  . Alcohol Use: No  . Drug Use: No  . Sexual Activity: Not on file   Other Topics Concern  . Not on file   Social History Narrative  . No narrative on file    No family history on file.  Past Medical History  Diagnosis Date  . Hypertension   . Arthritis   . Anxiety   . Depression   . Snoring disorder 04/08/2013  . 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 Back surgery      5 back surgeries  . Spinal cord stimulator implant    . Nasal sinus surgery      Current Outpatient Prescriptions  Medication Sig Dispense Refill  . amLODipine (NORVASC) 10 MG tablet Take 10  mg by mouth every morning.        Marland Kitchen amphetamine-dextroamphetamine (ADDERALL) 20 MG tablet       . atenolol (TENORMIN) 25 MG tablet Take 25 mg by mouth daily.        . diazepam (VALIUM) 10 MG tablet       . gabapentin (NEURONTIN) 600 MG tablet Take 600 mg by mouth 2 (two) times daily.      . hydrochlorothiazide (HYDRODIURIL) 25 MG tablet Take 25 mg by mouth every morning.        . sertraline (ZOLOFT) 100 MG tablet Take 200 mg by mouth every morning.        . traZODone (DESYREL) 100 MG tablet Take 100 mg by mouth at bedtime.        Marland Kitchen oxyCODONE-acetaminophen (PERCOCET/ROXICET) 5-325 MG per tablet        No current facility-administered medications for this visit.    Allergies as of 04/08/2013 - Review Complete 04/08/2013  Allergen Reaction Noted  . Lisinopril Anaphylaxis 03/17/2011  . Penicillins Hives 03/16/2011    Vitals: BP 122/83  Pulse 80  Resp 16  Ht 5\' 8"  (1.727 m)  Wt 260 lb (117.935 kg)  BMI 39.54 kg/m2 Last Weight:  Wt Readings from Last 1 Encounters:  04/08/13 260 lb (117.935 kg)   Last Height:   Ht Readings from Last 1 Encounters:  04/08/13 5\' 8"  (1.727 m)    Physical exam:  General: The patient is awake, alert and appears not in acute distress. The patient is well groomed. Head: Normocephalic, atraumatic. Neck is supple. Mallampati 2 , neck circumference: 15. Nasal septal deviation.   Congestion nasal.   Cardiovascular:  Regular rate and rhythm  without  murmurs or carotid bruit, and without distended neck veins. Respiratory: Lungs are clear to auscultation. Skin:  Without evidence of edema, or rash Trunk: BMI is elevated .  Neurologic exam : The patient is awake and alert, oriented to place and time.  Memory subjective   described as intact. There is a normal attention span & concentration ability. Speech is fluent without   dysarthria, dysphonia or aphasia. Mood and affect are appropriate.  Cranial nerves: Pupils are equal and briskly reactive to light.  Funduscopic exam without   evidence of pallor or edema. Extraocular movements  in vertical and horizontal planes intact and without nystagmus. Visual fields by finger perimetry are intact. Hearing to finger rub intact.  Facial sensation intact to fine touch. Facial motor strength is symmetric and tongue and uvula move midline.  Motor exam:   Normal tone and  normal muscle bulk and symmetric strength in all extremities. His grip strength is reduced.   Sensory:  Fine touch, pinprick and vibration were tested in all extremities.  L4-5 dermatome loss on the outside of the left leg.   Proprioception is normal.  Coordination: Rapid alternating movements in the fingers/hands is tested and normal. Finger-to-nose maneuver tested and normal without evidence of ataxia, dysmetria or tremor.  Gait and station: Patient walks without assistive device . Deep tendon reflexes: in the  upper and lower extremities are symmetric and intact. Babinski maneuver response  downgoing.   Assessment:  After physical and neurologic examination, review of laboratory studies,  and pre-existing records, assessment is that of a patient with a moderate  risk of OSA,  Based on mouth breathing , snoring, he sleeps prone and on the sides. Mallampati 3 and  Retrognathia.  He  wears a bite guard for bruxism.  Plan:  Treatment plan and additional workup : SPLIT for AHI 15 and score at 4 % . Needs Co2 measured, if available. Marland Kitchen

## 2013-04-08 NOTE — Patient Instructions (Signed)
Sleep Apnea  Sleep apnea is disorder that affects a person's sleep. A person with sleep apnea has abnormal pauses in their breathing when they sleep. It is hard for them to get a good sleep. This makes a person tired during the day. It also can lead to other physical problems. There are three types of sleep apnea. One type is when breathing stops for a short time because your airway is blocked (obstructive sleep apnea). Another type is when the brain sometimes fails to give the normal signal to breathe to the muscles that control your breathing (central sleep apnea). The third type is a combination of the other two types.  HOME CARE  · Do not sleep on your back. Try to sleep on your side.  · Take all medicine as told by your doctor.  · Avoid alcohol, calming medicines (sedatives), and depressant drugs.  · Try to lose weight if you are overweight. Talk to your doctor about a healthy weight goal.  Your doctor may have you use a device that helps to open your airway. It can help you get the air that you need. It is called a positive airway pressure (PAP) device. There are three types of PAP devices:  · Continuous positive airway pressure (CPAP) device.  · Nasal expiratory positive airway pressure (EPAP) device.  · Bilevel positive airway pressure (BPAP) device.  MAKE SURE YOU:  · Understand these instructions.  · Will watch your condition.  · Will get help right away if you are not doing well or get worse.  Document Released: 03/22/2008 Document Revised: 05/30/2012 Document Reviewed: 10/15/2011  Exit[employer]® Patient Information ©2014 [employer].

## 2013-04-23 ENCOUNTER — Ambulatory Visit (INDEPENDENT_AMBULATORY_CARE_PROVIDER_SITE_OTHER): Payer: 59

## 2013-04-23 DIAGNOSIS — M2619 Other specified anomalies of jaw-cranial base relationship: Secondary | ICD-10-CM

## 2013-04-23 DIAGNOSIS — R0683 Snoring: Secondary | ICD-10-CM

## 2013-04-23 DIAGNOSIS — G4731 Primary central sleep apnea: Secondary | ICD-10-CM

## 2013-05-06 ENCOUNTER — Telehealth: Payer: Self-pay | Admitting: Neurology

## 2013-05-06 NOTE — Telephone Encounter (Signed)
I called and left a message for the patient to callback to the office for his sleep study results. 

## 2013-05-07 ENCOUNTER — Encounter: Payer: Self-pay | Admitting: *Deleted

## 2013-05-07 ENCOUNTER — Other Ambulatory Visit: Payer: Self-pay | Admitting: *Deleted

## 2013-05-07 DIAGNOSIS — G4731 Primary central sleep apnea: Secondary | ICD-10-CM

## 2013-06-05 ENCOUNTER — Encounter (HOSPITAL_COMMUNITY): Payer: Self-pay | Admitting: Pharmacy Technician

## 2013-06-05 ENCOUNTER — Other Ambulatory Visit: Payer: Self-pay | Admitting: Neurosurgery

## 2013-06-10 NOTE — Pre-Procedure Instructions (Signed)
[name]  06/10/2013   Your procedure is scheduled on:  Thurs, Dec 18 @ 7:30 AM  Report to [facility] Stay [facility]trance A at 5:30 AM.  Call this number if you have problems the morning of surgery: [phone]   Remember:   Do not eat food or drink liquids after midnight.   Take these medicines the morning of surgery with A SIP OF WATER: Amlodipine(Norvasc),Adderall(Amphetamine-Dextroamphetamine),Atenolol(Tenormin),Valium(Diazepam),Gabapentin(Neurontin), And Zoloft if needed               No Goody's,BC's,Aleve,Aspirin,Ibuprofen,Fish Oil,or any Herbal Medications   Do not wear jewelry  Do not wear lotions, powders, or colognes. You may wear deodorant.  Men may shave face and neck.  Do not bring valuables to the hospital.  [facility] is not responsible                  for any belongings or valuables.               Contacts, dentures or bridgework may not be worn into surgery.  Leave suitcase in the car. After surgery it may be brought to your room.  For patients admitted to the hospital, discharge time is determined by your                treatment team.               Patients discharged the day of surgery will not be allowed to drive  home.    Special Instructions: Shower using CHG 2 nights before surgery and the night before surgery.  If you shower the day of surgery use CHG.  Use special wash - you have one bottle of CHG for all showers.  You should use approximately 1/3 of the bottle for each shower.   Please read over the following fact sheets that you were given: Pain Booklet, Coughing and Deep Breathing, MRSA Information and Surgical Site Infection Prevention

## 2013-06-11 ENCOUNTER — Encounter (HOSPITAL_COMMUNITY)
Admission: RE | Admit: 2013-06-11 | Discharge: 2013-06-11 | Disposition: A | Discharge status: Home / Self Care | Payer: 59 | Source: Ambulatory Visit | Attending: Neurosurgery | Admitting: Neurosurgery

## 2013-06-11 ENCOUNTER — Ambulatory Visit (HOSPITAL_COMMUNITY)
Admission: RE | Admit: 2013-06-11 | Discharge: 2013-06-11 | Disposition: A | Discharge status: Home / Self Care | Payer: 59 | Source: Ambulatory Visit | Attending: Anesthesiology | Admitting: Anesthesiology

## 2013-06-11 ENCOUNTER — Encounter (HOSPITAL_COMMUNITY): Payer: Self-pay

## 2013-06-11 DIAGNOSIS — I10 Essential (primary) hypertension: Secondary | ICD-10-CM | POA: Insufficient documentation

## 2013-06-11 DIAGNOSIS — Z9889 Other specified postprocedural states: Secondary | ICD-10-CM | POA: Insufficient documentation

## 2013-06-11 LAB — BASIC METABOLIC PANEL
BUN: 14 mg/dL (ref 6–23)
CO2: 27 mEq/L (ref 19–32)
Calcium: 10 mg/dL (ref 8.4–10.5)
Chloride: 102 mEq/L (ref 96–112)
Creatinine, Ser: 0.81 mg/dL (ref 0.50–1.35)
GFR calc Af Amer: >90 mL/min (ref >90)
GFR calc non Af Amer: >90 mL/min (ref >90)
Glucose, Bld: 104 mg/dL — ABNORMAL HIGH (ref 70–99)
Potassium: 4.3 mEq/L (ref 3.5–5.1)
Sodium: 139 mEq/L (ref 135–145)

## 2013-06-11 LAB — CBC
HCT: 43.7 % (ref 39.0–52.0)
Hemoglobin: 15.6 g/dL (ref 13.0–17.0)
MCH: 32 pg (ref 26.0–34.0)
MCHC: 35.7 g/dL (ref 30.0–36.0)
MCV: 89.5 fL (ref 78.0–100.0)
Platelets: 140 10*3/uL — ABNORMAL LOW (ref 150–400)
RBC: 4.88 MIL/uL (ref 4.22–5.81)
RDW: 14.1 % (ref 11.5–15.5)
WBC: 6.3 10*3/uL (ref 4.0–10.5)

## 2013-06-11 LAB — SURGICAL PCR SCREEN
MRSA, PCR: NEGATIVE
Staphylococcus aureus: POSITIVE — AB

## 2013-06-11 LAB — TYPE AND SCREEN
ABO/RH(D): A NEG
Antibody Screen: NEGATIVE

## 2013-06-12 MED ORDER — VANCOMYCIN HCL IN DEXTROSE 1-5 GM/200ML-% IV SOLN
1000.0000 mg | INTRAVENOUS | Status: DC
  Filled 2013-06-12: qty 200

## 2013-06-13 ENCOUNTER — Ambulatory Visit (HOSPITAL_COMMUNITY): Payer: 59 | Admitting: Certified Registered Nurse Anesthetist

## 2013-06-13 ENCOUNTER — Encounter (HOSPITAL_COMMUNITY)
Admission: RE | Disposition: A | Discharge status: Home / Self Care | Payer: Self-pay | Source: Ambulatory Visit | Attending: Neurosurgery

## 2013-06-13 ENCOUNTER — Encounter (HOSPITAL_COMMUNITY): Payer: 59 | Admitting: Certified Registered Nurse Anesthetist

## 2013-06-13 ENCOUNTER — Ambulatory Visit (HOSPITAL_COMMUNITY)
Admission: RE | Admit: 2013-06-13 | Discharge: 2013-06-14 | Disposition: A | Discharge status: Home / Self Care | Payer: 59 | Source: Ambulatory Visit | Attending: Neurosurgery | Admitting: Neurosurgery

## 2013-06-13 ENCOUNTER — Ambulatory Visit (HOSPITAL_COMMUNITY): Payer: 59

## 2013-06-13 ENCOUNTER — Encounter (HOSPITAL_COMMUNITY): Payer: Self-pay | Admitting: *Deleted

## 2013-06-13 DIAGNOSIS — F3289 Other specified depressive episodes: Secondary | ICD-10-CM | POA: Insufficient documentation

## 2013-06-13 DIAGNOSIS — M533 Sacrococcygeal disorders, not elsewhere classified: Secondary | ICD-10-CM | POA: Diagnosis present

## 2013-06-13 DIAGNOSIS — F329 Major depressive disorder, single episode, unspecified: Secondary | ICD-10-CM | POA: Insufficient documentation

## 2013-06-13 DIAGNOSIS — I10 Essential (primary) hypertension: Secondary | ICD-10-CM | POA: Insufficient documentation

## 2013-06-13 DIAGNOSIS — F411 Generalized anxiety disorder: Secondary | ICD-10-CM | POA: Insufficient documentation

## 2013-06-13 HISTORY — PX: LUMBAR FUSION: SHX111

## 2013-06-13 SURGERY — LUMBAR FACET FUSION
Anesthesia: General | Site: Spine Lumbar | Laterality: Left

## 2013-06-13 MED ORDER — BUPIVACAINE HCL (PF) 0.5 % IJ SOLN
INTRAMUSCULAR | Status: DC | PRN
  Administered 2013-06-13: 10 mL

## 2013-06-13 MED ORDER — NEOSTIGMINE METHYLSULFATE 1 MG/ML IJ SOLN
INTRAMUSCULAR | Status: DC | PRN
  Administered 2013-06-13: 3 mg via INTRAVENOUS

## 2013-06-13 MED ORDER — HEMOSTATIC AGENTS (NO CHARGE) OPTIME
TOPICAL | Status: DC | PRN
  Administered 2013-06-13: 1 via TOPICAL

## 2013-06-13 MED ORDER — KETOROLAC TROMETHAMINE 30 MG/ML IJ SOLN
30.0000 mg | Freq: Four times a day (QID) | INTRAMUSCULAR | Status: DC
  Administered 2013-06-13 – 2013-06-14 (×3): 30 mg via INTRAVENOUS
  Filled 2013-06-13 (×6): qty 1

## 2013-06-13 MED ORDER — ACETAMINOPHEN 325 MG PO TABS: 650.0000 mg | ORAL_TABLET | ORAL | Status: DC | PRN

## 2013-06-13 MED ORDER — SODIUM CHLORIDE 0.9 % IJ SOLN
3.0000 mL | Freq: Two times a day (BID) | INTRAMUSCULAR | Status: DC
  Administered 2013-06-13 – 2013-06-14 (×3): 3 mL via INTRAVENOUS

## 2013-06-13 MED ORDER — PHENOL 1.4 % MT LIQD: 1.0000 | OROMUCOSAL | Status: DC | PRN

## 2013-06-13 MED ORDER — OXYCODONE-ACETAMINOPHEN 5-325 MG PO TABS
1.0000 | ORAL_TABLET | ORAL | Status: DC | PRN
  Administered 2013-06-13 – 2013-06-14 (×5): 2 via ORAL
  Filled 2013-06-13 (×5): qty 2

## 2013-06-13 MED ORDER — MENTHOL 3 MG MT LOZG: 1.0000 | LOZENGE | OROMUCOSAL | Status: DC | PRN

## 2013-06-13 MED ORDER — ONDANSETRON HCL 4 MG/2ML IJ SOLN: 4.0000 mg | Freq: Four times a day (QID) | INTRAMUSCULAR | Status: DC | PRN

## 2013-06-13 MED ORDER — SERTRALINE HCL 100 MG PO TABS
200.0000 mg | ORAL_TABLET | Freq: Every day | ORAL | Status: DC
  Administered 2013-06-14: 200 mg via ORAL
  Filled 2013-06-13: qty 2

## 2013-06-13 MED ORDER — MUPIROCIN 2 % EX OINT
TOPICAL_OINTMENT | Freq: Two times a day (BID) | CUTANEOUS | Status: DC
  Administered 2013-06-13 – 2013-06-14 (×2): via NASAL

## 2013-06-13 MED ORDER — 0.9 % SODIUM CHLORIDE (POUR BTL) OPTIME
TOPICAL | Status: DC | PRN
  Administered 2013-06-13: 1000 mL

## 2013-06-13 MED ORDER — HYDROMORPHONE HCL PF 1 MG/ML IJ SOLN
INTRAMUSCULAR | Status: AC
  Filled 2013-06-13: qty 1

## 2013-06-13 MED ORDER — VANCOMYCIN HCL IN DEXTROSE 1-5 GM/200ML-% IV SOLN
1000.0000 mg | Freq: Once | INTRAVENOUS | Status: AC
  Administered 2013-06-13: 1000 mg via INTRAVENOUS
  Filled 2013-06-13: qty 200

## 2013-06-13 MED ORDER — VANCOMYCIN HCL 10 G IV SOLR
1500.0000 mg | Freq: Once | INTRAVENOUS | Status: AC
  Administered 2013-06-13: 1500 mg via INTRAVENOUS
  Filled 2013-06-13: qty 1500

## 2013-06-13 MED ORDER — ALUM & MAG HYDROXIDE-SIMETH 200-200-20 MG/5ML PO SUSP: 30.0000 mL | Freq: Four times a day (QID) | ORAL | Status: DC | PRN

## 2013-06-13 MED ORDER — MIDAZOLAM HCL 5 MG/5ML IJ SOLN
INTRAMUSCULAR | Status: DC | PRN
  Administered 2013-06-13: 2 mg via INTRAVENOUS

## 2013-06-13 MED ORDER — ATENOLOL 25 MG PO TABS
25.0000 mg | ORAL_TABLET | Freq: Every day | ORAL | Status: DC
  Administered 2013-06-14: 25 mg via ORAL
  Filled 2013-06-13: qty 1

## 2013-06-13 MED ORDER — SODIUM CHLORIDE 0.9 % IV SOLN
10.0000 mg | INTRAVENOUS | Status: DC | PRN
  Administered 2013-06-13: 10 ug/min via INTRAVENOUS

## 2013-06-13 MED ORDER — FENTANYL CITRATE 0.05 MG/ML IJ SOLN
INTRAMUSCULAR | Status: DC | PRN
  Administered 2013-06-13: 100 ug via INTRAVENOUS
  Administered 2013-06-13: 50 ug via INTRAVENOUS

## 2013-06-13 MED ORDER — PANTOPRAZOLE SODIUM 40 MG IV SOLR
40.0000 mg | Freq: Every day | INTRAVENOUS | Status: DC
  Administered 2013-06-13: 40 mg via INTRAVENOUS
  Filled 2013-06-13 (×2): qty 40

## 2013-06-13 MED ORDER — SODIUM CHLORIDE 0.9 % IR SOLN
Status: DC | PRN
  Administered 2013-06-13: 09:00:00

## 2013-06-13 MED ORDER — GABAPENTIN 600 MG PO TABS
600.0000 mg | ORAL_TABLET | Freq: Three times a day (TID) | ORAL | Status: DC
  Administered 2013-06-13 – 2013-06-14 (×3): 600 mg via ORAL
  Filled 2013-06-13 (×5): qty 1

## 2013-06-13 MED ORDER — LIDOCAINE HCL (CARDIAC) 20 MG/ML IV SOLN
INTRAVENOUS | Status: DC | PRN
  Administered 2013-06-13: 80 mg via INTRAVENOUS

## 2013-06-13 MED ORDER — OXYCODONE HCL 5 MG/5ML PO SOLN: 5.0000 mg | Freq: Once | ORAL | Status: DC | PRN

## 2013-06-13 MED ORDER — GLYCOPYRROLATE 0.2 MG/ML IJ SOLN
INTRAMUSCULAR | Status: DC | PRN
  Administered 2013-06-13: 0.4 mg via INTRAVENOUS

## 2013-06-13 MED ORDER — PROPOFOL 10 MG/ML IV BOLUS
INTRAVENOUS | Status: DC | PRN
  Administered 2013-06-13: 200 mg via INTRAVENOUS

## 2013-06-13 MED ORDER — OXYCODONE HCL 5 MG PO TABS: 5.0000 mg | ORAL_TABLET | Freq: Once | ORAL | Status: DC | PRN

## 2013-06-13 MED ORDER — DEXAMETHASONE SODIUM PHOSPHATE 10 MG/ML IJ SOLN
10.0000 mg | INTRAMUSCULAR | Status: AC
  Administered 2013-06-13: 10 mg via INTRAVENOUS
  Filled 2013-06-13: qty 1

## 2013-06-13 MED ORDER — SODIUM CHLORIDE 0.9 % IJ SOLN: 3.0000 mL | INTRAMUSCULAR | Status: DC | PRN

## 2013-06-13 MED ORDER — HYDROMORPHONE HCL PF 1 MG/ML IJ SOLN
0.2500 mg | INTRAMUSCULAR | Status: DC | PRN
  Administered 2013-06-13 (×2): 0.5 mg via INTRAVENOUS

## 2013-06-13 MED ORDER — HYDROMORPHONE HCL PF 1 MG/ML IJ SOLN
1.0000 mg | INTRAMUSCULAR | Status: DC | PRN
  Administered 2013-06-13 (×2): 1.5 mg via INTRAMUSCULAR
  Administered 2013-06-14: 1 mg via INTRAMUSCULAR
  Filled 2013-06-13: qty 1
  Filled 2013-06-13 (×2): qty 2

## 2013-06-13 MED ORDER — KCL IN DEXTROSE-NACL 20-5-0.45 MEQ/L-%-% IV SOLN
80.0000 mL/h | INTRAVENOUS | Status: DC
  Filled 2013-06-13 (×3): qty 1000

## 2013-06-13 MED ORDER — HYDROCHLOROTHIAZIDE 25 MG PO TABS
25.0000 mg | ORAL_TABLET | Freq: Every day | ORAL | Status: DC
Start: 2013-06-13 — End: 2013-06-14
  Administered 2013-06-13 – 2013-06-14 (×2): 25 mg via ORAL
  Filled 2013-06-13 (×2): qty 1

## 2013-06-13 MED ORDER — CYCLOBENZAPRINE HCL 10 MG PO TABS
10.0000 mg | ORAL_TABLET | Freq: Three times a day (TID) | ORAL | Status: DC | PRN
  Administered 2013-06-13 – 2013-06-14 (×3): 10 mg via ORAL
  Filled 2013-06-13 (×3): qty 1

## 2013-06-13 MED ORDER — ONDANSETRON HCL 4 MG/2ML IJ SOLN
INTRAMUSCULAR | Status: DC | PRN
  Administered 2013-06-13 (×2): 4 mg via INTRAVENOUS

## 2013-06-13 MED ORDER — THROMBIN 5000 UNITS EX SOLR
CUTANEOUS | Status: DC | PRN
  Administered 2013-06-13 (×2): 5000 [IU] via TOPICAL

## 2013-06-13 MED ORDER — LACTATED RINGERS IV SOLN
INTRAVENOUS | Status: DC | PRN
  Administered 2013-06-13: 07:00:00 via INTRAVENOUS

## 2013-06-13 MED ORDER — SODIUM CHLORIDE 0.9 % IV SOLN
INTRAVENOUS | Status: DC | PRN
  Administered 2013-06-13: 08:00:00 via INTRAVENOUS

## 2013-06-13 MED ORDER — ACETAMINOPHEN 650 MG RE SUPP: 650.0000 mg | RECTAL | Status: DC | PRN

## 2013-06-13 MED ORDER — PHENYLEPHRINE HCL 10 MG/ML IJ SOLN
INTRAMUSCULAR | Status: DC | PRN
  Administered 2013-06-13 (×3): 80 ug via INTRAVENOUS

## 2013-06-13 MED ORDER — ONDANSETRON HCL 4 MG/2ML IJ SOLN: 4.0000 mg | INTRAMUSCULAR | Status: DC | PRN

## 2013-06-13 MED ORDER — TRAZODONE HCL 100 MG PO TABS
100.0000 mg | ORAL_TABLET | Freq: Every evening | ORAL | Status: DC | PRN
  Filled 2013-06-13: qty 1

## 2013-06-13 MED ORDER — AMLODIPINE BESYLATE 10 MG PO TABS
10.0000 mg | ORAL_TABLET | Freq: Every day | ORAL | Status: DC
  Administered 2013-06-13 – 2013-06-14 (×2): 10 mg via ORAL
  Filled 2013-06-13 (×2): qty 1

## 2013-06-13 MED ORDER — ROCURONIUM BROMIDE 100 MG/10ML IV SOLN
INTRAVENOUS | Status: DC | PRN
  Administered 2013-06-13: 50 mg via INTRAVENOUS

## 2013-06-13 SURGICAL SUPPLY — 60 items
ADH SKN CLS APL DERMABOND .7 (GAUZE/BANDAGES/DRESSINGS)
ADH SKN CLS LQ APL DERMABOND (GAUZE/BANDAGES/DRESSINGS) ×1
APL SKNCLS STERI-STRIP NONHPOA (GAUZE/BANDAGES/DRESSINGS) ×2
BAG DECANTER FOR FLEXI CONT (MISCELLANEOUS) ×2 IMPLANT
BENZOIN TINCTURE PRP APPL 2/3 (GAUZE/BANDAGES/DRESSINGS) ×4 IMPLANT
BLADE SURG ROTATE 9660 (MISCELLANEOUS) IMPLANT
BRUSH SCRUB EZ PLAIN DRY (MISCELLANEOUS) ×1 IMPLANT
CONT SPEC 4OZ CLIKSEAL STRL BL (MISCELLANEOUS) ×2 IMPLANT
COVER BACK TABLE 24X17X13 BIG (DRAPES) IMPLANT
COVER TABLE BACK 60X90 (DRAPES) ×1 IMPLANT
DERMABOND ADHESIVE PROPEN (GAUZE/BANDAGES/DRESSINGS) ×1
DERMABOND ADVANCED (GAUZE/BANDAGES/DRESSINGS)
DERMABOND ADVANCED .7 DNX12 (GAUZE/BANDAGES/DRESSINGS) ×1 IMPLANT
DERMABOND ADVANCED .7 DNX6 (GAUZE/BANDAGES/DRESSINGS) IMPLANT
DRAPE C-ARM 42X72 X-RAY (DRAPES) ×2 IMPLANT
DRAPE C-ARMOR (DRAPES) ×2 IMPLANT
DRAPE LAPAROTOMY 100X72X124 (DRAPES) ×2 IMPLANT
DRAPE SURG 17X23 STRL (DRAPES) ×4 IMPLANT
DRESSING TELFA 8X3 (GAUZE/BANDAGES/DRESSINGS) ×1 IMPLANT
DRSG OPSITE POSTOP 4X6 (GAUZE/BANDAGES/DRESSINGS) ×1 IMPLANT
ELECT BLADE 4.0 EZ CLEAN MEGAD (MISCELLANEOUS) ×2
ELECT REM PT RETURN 9FT ADLT (ELECTROSURGICAL) ×2
ELECTRODE BLDE 4.0 EZ CLN MEGD (MISCELLANEOUS) ×1 IMPLANT
ELECTRODE REM PT RTRN 9FT ADLT (ELECTROSURGICAL) ×1 IMPLANT
EVACUATOR 1/8 PVC DRAIN (DRAIN) ×1 IMPLANT
GAUZE SPONGE 4X4 16PLY XRAY LF (GAUZE/BANDAGES/DRESSINGS) IMPLANT
GLOVE BIO SURGEON STRL SZ8 (GLOVE) IMPLANT
GLOVE BIOGEL PI IND STRL 8 (GLOVE) IMPLANT
GLOVE BIOGEL PI INDICATOR 8 (GLOVE) ×3
GLOVE ECLIPSE 7.5 STRL STRAW (GLOVE) ×4 IMPLANT
GLOVE ECLIPSE 8.0 STRL XLNG CF (GLOVE) ×2 IMPLANT
GLOVE EXAM NITRILE LRG STRL (GLOVE) IMPLANT
GLOVE EXAM NITRILE XL STR (GLOVE) IMPLANT
GLOVE EXAM NITRILE XS STR PU (GLOVE) IMPLANT
GOWN BRE IMP SLV AUR LG STRL (GOWN DISPOSABLE) ×2 IMPLANT
GOWN BRE IMP SLV AUR XL STRL (GOWN DISPOSABLE) ×1 IMPLANT
GOWN STRL REIN 2XL LVL4 (GOWN DISPOSABLE) ×2 IMPLANT
KIT BASIN OR (CUSTOM PROCEDURE TRAY) ×2 IMPLANT
KIT ROOM TURNOVER OR (KITS) ×2 IMPLANT
NEEDLE HYPO 22GX1.5 SAFETY (NEEDLE) ×2 IMPLANT
NS IRRIG 1000ML POUR BTL (IV SOLUTION) ×2 IMPLANT
PACK LAMINECTOMY NEURO (CUSTOM PROCEDURE TRAY) ×2 IMPLANT
PIN EXCHANGE 500MM (PIN) ×1 IMPLANT
PIN STEINMAN BLUNT 2.4X300MM (PIN) ×2 IMPLANT
PIN STEINMAN TROCAR 300MM (PIN) ×2 IMPLANT
PUTTY BONE GRAFT KIT 2.5ML (Bone Implant) ×1 IMPLANT
SCREW CANN SI JT FUS 12.5X35 (Screw) ×1 IMPLANT
SCREW CANN SI JT FUS 12.5X45 (Screw) ×1 IMPLANT
SCREW CANN SI JT FUS 7X45 (Screw) ×1 IMPLANT
SPONGE GAUZE 4X4 12PLY (GAUZE/BANDAGES/DRESSINGS) ×1 IMPLANT
SPONGE LAP 4X18 X RAY DECT (DISPOSABLE) IMPLANT
SPONGE SURGIFOAM ABS GEL SZ50 (HEMOSTASIS) ×1 IMPLANT
STRIP CLOSURE SKIN 1/2X4 (GAUZE/BANDAGES/DRESSINGS) ×2 IMPLANT
SUT VIC AB 2-0 OS6 18 (SUTURE) ×11 IMPLANT
SUT VIC AB 3-0 CP2 18 (SUTURE) ×5 IMPLANT
SYR 20ML ECCENTRIC (SYRINGE) ×2 IMPLANT
TOWEL OR 17X24 6PK STRL BLUE (TOWEL DISPOSABLE) ×2 IMPLANT
TOWEL OR 17X26 10 PK STRL BLUE (TOWEL DISPOSABLE) ×2 IMPLANT
TRAY FOLEY CATH 14FRSI W/METER (CATHETERS) IMPLANT
WATER STERILE IRR 1000ML POUR (IV SOLUTION) ×2 IMPLANT

## 2013-06-13 NOTE — Preoperative (Signed)
Beta Blockers   Reason not to administer Beta Blockers:Not Applicable 

## 2013-06-13 NOTE — Anesthesia Preprocedure Evaluation (Signed)
Anesthesia Evaluation  Patient identified by MRN, date of birth, ID band Patient awake    Reviewed: Allergy & Precautions, H&P , NPO status , Patient's Chart, lab work & pertinent test results  Airway Mallampati: II  Neck ROM: full    Dental   Pulmonary          Cardiovascular hypertension,     Neuro/Psych Anxiety Depression    GI/Hepatic   Endo/Other  Morbid obesity  Renal/GU      Musculoskeletal  (+) Arthritis -,   Abdominal   Peds  Hematology   Anesthesia Other Findings   Reproductive/Obstetrics                           Anesthesia Physical Anesthesia Plan  ASA: II  Anesthesia Plan: General   Post-op Pain Management:    Induction: Intravenous  Airway Management Planned: Oral ETT  Additional Equipment:   Intra-op Plan:   Post-operative Plan: Extubation in OR  Informed Consent: I have reviewed the patients History and Physical, chart, labs and discussed the procedure including the risks, benefits and alternatives for the proposed anesthesia with the patient or authorized representative who has indicated his/her understanding and acceptance.     Plan Discussed with: CRNA, Anesthesiologist and Surgeon  Anesthesia Plan Comments:         Anesthesia Quick Evaluation

## 2013-06-13 NOTE — Transfer of Care (Signed)
Immediate Anesthesia Transfer of Care Note  Patient: [name]  Procedure(s) Performed: Procedure(s) with comments: SACROILIAC JOINT FUSION,LEFT (Left) - left  Patient Location: PACU  Anesthesia Type:General  Level of Consciousness: sedated  Airway & Oxygen Therapy: Patient Spontanous Breathing and Patient connected to face mask oxygen  Post-op Assessment: Report given to PACU RN and Post -op Vital signs reviewed and stable  Post vital signs: Reviewed and stable  Complications: No apparent anesthesia complications

## 2013-06-13 NOTE — Progress Notes (Signed)
ANTIBIOTIC CONSULT NOTE - INITIAL  Pharmacy Consult for vancomycin Indication: surgical prophylaxis  Allergies  Allergen Reactions  . Lisinopril Anaphylaxis  . Penicillins Hives    Patient Measurements:   Adjusted Body Weight:   Vital Signs: Temp: 98 F (36.7 C) (12/18 1343) Temp src: Oral (12/18 0639) BP: 112/68 mmHg (12/18 1343) Pulse Rate: 86 (12/18 1343) Intake/Output from previous day:   Intake/Output from this shift: Total I/O In: 1300 [I.V.:1300] Out: 25 [Blood:25]  Labs:  Recent Labs  06/11/13 0856  WBC 6.3  HGB 15.6  PLT 140*  CREATININE 0.81   The CrCl is unknown because both a height and weight (above a minimum accepted value) are required for this calculation. No results found for this basename: VANCOTROUGH, Leodis Binet, VANCORANDOM, GENTTROUGH, GENTPEAK, GENTRANDOM, TOBRATROUGH, TOBRAPEAK, TOBRARND, AMIKACINPEAK, AMIKACINTROU, AMIKACIN,  in the last 72 hours   Microbiology: Recent Results (from the past 720 hour(s))  SURGICAL PCR SCREEN     Status: Abnormal   Collection Time    06/11/13  8:55 AM      Result Value Range Status   MRSA, PCR NEGATIVE  NEGATIVE Final   Staphylococcus aureus POSITIVE (*) NEGATIVE Final   Comment:            The Xpert SA Assay (FDA     approved for NASAL specimens     in patients over 80 years of age),     is one component of     a comprehensive surveillance     program.  Test performance has     been validated by [employer] for patients greater     than or equal to 47 year old.     It is not intended     to diagnose infection nor to     guide or monitor treatment.    Medical History: Past Medical History  Diagnosis Date  . Hypertension   . Arthritis   . Anxiety   . Depression   . Snoring disorder 04/08/2013  . 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 amLODipine  10 mg Oral Daily  . [START ON 06/14/2013] atenolol  25 mg Oral Daily  . gabapentin  600 mg Oral TID  .  hydrochlorothiazide  25 mg Oral Daily  . HYDROmorphone      . ketorolac  30 mg Intravenous Q6H  . pantoprazole (PROTONIX) IV  40 mg Intravenous QHS  . [START ON 06/14/2013] sertraline  200 mg Oral Daily  . sodium chloride  3 mL Intravenous Q12H   Infusions:  . dextrose 5 % and 0.45 % NaCl with KCl 20 mEq/L     Assessment: 42 yo male s/p neuro surgery will be put on vancomycin for surgical prophylaxis.  Patient got vancomycin 1500mg  iv x1 at 0745 on 06/13/13.  SCr 0.81 (CrCl > 100) on 06/11/13. Wt 116.8 kg.  Per RN, patient doesn't have a drain.  Goal of Therapy:  Vancomycin trough level 15-20 mcg/ml  Plan:  1) Vancomycin 1g iv x1 at 1945 tonight. Pharmacy will sign off  So, Tsz-Yin 06/13/2013,2:14 PM

## 2013-06-13 NOTE — Anesthesia Procedure Notes (Signed)
Procedure Name: Intubation Date/Time: 06/13/2013 7:38 AM Performed by: Quentin Ore Pre-anesthesia Checklist: Patient identified, Emergency Drugs available, Suction available, Patient being monitored and Timeout performed Patient Re-evaluated:Patient Re-evaluated prior to inductionOxygen Delivery Method: Circle system utilized Preoxygenation: Pre-oxygenation with 100% oxygen Intubation Type: IV induction Ventilation: Mask ventilation without difficulty Laryngoscope Size: Mac and 3 Grade View: Grade I Tube type: Oral Tube size: 7.5 mm Number of attempts: 1 Airway Equipment and Method: Stylet Placement Confirmation: ETT inserted through vocal cords under direct vision,  positive ETCO2 and breath sounds checked- equal and bilateral Secured at: 22 cm Tube secured with: Tape Dental Injury: Teeth and Oropharynx as per pre-operative assessment

## 2013-06-13 NOTE — H&P (Signed)
[name] is an 42 y.o. male.   Chief Complaint: Left sacroiliac joint dysfunction HPI: The patient is a 42 year old gentleman who has had numerous lumbar fusions by other neurosurgeons in town. He eventually saw me a number of years ago for spinal stimulator which was placed which is giving him some relief but he still has bad left-sided aching back pain with some radiation towards the buttock. He came back recently asking whether his stimulator could be adjusted because it bothers his foot pain because of his underlying neuropathy. I explained to him that that was not possible that we start to discuss his situation we did some dynamic testing for the SI joint. He's had some injections in the past muscle it recently in August this did give him some temporary relief but nothing sustained. We discussed the options of a new SI joint fusions he was extremely interested want to proceed in that direction. Now comes for a left sacroiliac joint fusion. I had a long discussion with him regarding the risks and benefits of surgical intervention. The risks discussed include but are not limited to bleeding infection weakness and numbness with instrumentation nonunion coma and death. We've discussed alternative methods of therapy offered risks and benefits of nonintervention. He's had the opportunity to ask numerous questions and appears to understand. With this information in hand he has requested we proceed with surgery.  Past Medical History  Diagnosis Date  . Hypertension   . Arthritis   . Anxiety   . Depression   . Snoring disorder 04/08/2013  . 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 Back surgery      5 back surgeries  . Spinal cord stimulator implant    . Nasal sinus surgery      History reviewed. No pertinent family history. Social History:  reports that he has never smoked. He does not have any smokeless tobacco history on file. He reports that he does  not drink alcohol or use illicit drugs.  Allergies:  Allergies  Allergen Reactions  . Lisinopril Anaphylaxis  . Penicillins Hives    Medications Prior to Admission  Medication Sig Dispense Refill  . amLODipine (NORVASC) 10 MG tablet Take 10 mg by mouth every morning.        Marland Kitchen amphetamine-dextroamphetamine (ADDERALL) 20 MG tablet Take 20 mg by mouth daily as needed (attention/alertness).       Marland Kitchen atenolol (TENORMIN) 25 MG tablet Take 25 mg by mouth daily.        . diazepam (VALIUM) 10 MG tablet Take 10 mg by mouth 2 (two) times daily as needed for anxiety.       . gabapentin (NEURONTIN) 600 MG tablet Take 600 mg by mouth 3 (three) times daily.       Marland Kitchen gemfibrozil (LOPID) 600 MG tablet Take 600 mg by mouth daily.      . hydrochlorothiazide (HYDRODIURIL) 25 MG tablet Take 25 mg by mouth every morning.        . mupirocin ointment (BACTROBAN) 2 % Place 1 application into the nose 2 (two) times daily.      Marland Kitchen oxymorphone (OPANA ER) 10 MG 12 hr tablet Take 10 mg by mouth every 12 (twelve) hours.      Marland Kitchen oxymorphone (OPANA) 5 MG tablet Take 5 mg by mouth 2 (two) times daily as needed for pain.      Marland Kitchen sertraline (ZOLOFT) 100 MG tablet Take 200 mg by mouth every morning.        Marland Kitchen  traZODone (DESYREL) 100 MG tablet Take 100 mg by mouth at bedtime as needed for sleep.         Results for orders placed during the hospital encounter of 06/11/13 (from the past 48 hour(s))  SURGICAL PCR SCREEN     Status: Abnormal   Collection Time    06/11/13  8:55 AM      Result Value Range   MRSA, PCR NEGATIVE  NEGATIVE   Staphylococcus aureus POSITIVE (*) NEGATIVE   Comment:            The Xpert SA Assay (FDA     approved for NASAL specimens     in patients over 88 years of age),     is one component of     a comprehensive surveillance     program.  Test performance has     been validated by [employer] for patients greater     than or equal to 48 year old.     It is not intended     to diagnose  infection nor to     guide or monitor treatment.  BASIC METABOLIC PANEL     Status: Abnormal   Collection Time    06/11/13  8:56 AM      Result Value Range   Sodium 139  135 - 145 mEq/L   Potassium 4.3  3.5 - 5.1 mEq/L   Chloride 102  96 - 112 mEq/L   CO2 27  19 - 32 mEq/L   Glucose, Bld 104 (*) 70 - 99 mg/dL   BUN 14  6 - 23 mg/dL   Creatinine, Ser 1.61  0.50 - 1.35 mg/dL   Calcium 09.6  8.4 - 04.5 mg/dL   GFR calc non Af Amer >90  >90 mL/min   GFR calc Af Amer >90  >90 mL/min   Comment: (NOTE)     The eGFR has been calculated using the CKD EPI equation.     This calculation has not been validated in all clinical situations.     eGFR's persistently <90 mL/min signify possible Chronic Kidney     Disease.  CBC     Status: Abnormal   Collection Time    06/11/13  8:56 AM      Result Value Range   WBC 6.3  4.0 - 10.5 K/uL   RBC 4.88  4.22 - 5.81 MIL/uL   Hemoglobin 15.6  13.0 - 17.0 g/dL   HCT 40.9  81.1 - 91.4 %   MCV 89.5  78.0 - 100.0 fL   MCH 32.0  26.0 - 34.0 pg   MCHC 35.7  30.0 - 36.0 g/dL   RDW 78.2  95.6 - 21.3 %   Platelets 140 (*) 150 - 400 K/uL  TYPE AND SCREEN     Status: None   Collection Time    06/11/13  9:00 AM      Result Value Range   ABO/RH(D) A NEG     Antibody Screen NEG     Sample Expiration 06/14/2013     Dg Chest 2 View  06/11/2013   CLINICAL DATA:  Neurostimulator.  Hypertension.  EXAM: CHEST  2 VIEW  COMPARISON:  Chest x-ray 12/24/2010.  FINDINGS: Mediastinum and hilar structures are normal. Lungs are clear. No pleural effusion or pneumothorax. Heart size and pulmonary vascularity normal. Neurostimulator noted over the mid thoracic spine. Degenerative changes thoracic spine.  IMPRESSION: No active cardiopulmonary disease. Neurostimulator noted mid thoracic spine.  Electronically Signed   By: Maisie Fus  Register   On: 06/11/2013 11:38    Pertinent items are noted in HPI.  Blood pressure 120/75, pulse 85, temperature 98 F (36.7 C), temperature  source Oral, resp. rate 16, SpO2 97.00%.  The patient is awake or and oriented. His no facial asymmetry. His gait is mildly antalgic. He is marked tenderness with compression of the SI joint on the left. With him on his back we compressed the anterior iliac spine this also reproduces his pain on the left side. We flexor decreased strength is 5 over 5 Assessment/Plan Impression is that of left SI joint disease and the plan is for a left SI joint fusion.  Reinaldo Meeker, MD 06/13/2013, 7:26 AM

## 2013-06-13 NOTE — Plan of Care (Signed)
Problem: Consults Goal: Diagnosis - Spinal Surgery Outcome: Completed/Met Date Met:  06/13/13 LT. SACROILIAC JOINT FUSION

## 2013-06-13 NOTE — Anesthesia Postprocedure Evaluation (Signed)
Anesthesia Post Note  Patient: [name]  Procedure(s) Performed: Procedure(s) (LRB): SACROILIAC JOINT FUSION,LEFT (Left)  Anesthesia type: General  Patient location: PACU  Post pain: Pain level controlled and Adequate analgesia  Post assessment: Post-op Vital signs reviewed, Patient's Cardiovascular Status Stable, Respiratory Function Stable, Patent Airway and Pain level controlled  Last Vitals:  Filed Vitals:   06/13/13 1011  BP:   Pulse:   Temp: 37.1 C  Resp:     Post vital signs: Reviewed and stable  Level of consciousness: awake, alert  and oriented  Complications: No apparent anesthesia complications

## 2013-06-13 NOTE — Op Note (Signed)
Preop diagnosis: Left sacroiliac joint dysfunction Postop diagnosis: Same Procedure: Left sacroiliac joint fusion with TriCor system Surgeon: Insurance risk surveyor: Newell Coral  After being placed in the prone position the patient's left buttock and hip region were prepped and draped in usual sterile fashion. Fluoroscopy was brought into the field to identify the posterior margin of the sacrum and the anterior alar lines. We then made an incision 1 cm inferior to the superior line and 1 cm posterior to the anterior line. We carried it down through the fat placed a self-retaining retractor. We chose entry point for the blunt pin into the lateral ilium and engaged 1-2 cm. We confirmed good placement and lateral inlet and outlet pelvic views and then advanced it using the outlet view. We then dilated through the soft tissue and did sequential dilation. We then removed the 2 smaller dilators to have are working channel. We drilled over the pin to the appropriate depth confirming good direction under all 3 for arthroscopic techniques. We then used to tap and then placed a 45 mm bolt filled with a mixture of autologous bone morselized allograft. This was followed in excellent position. We left the pin in place and used that to mark our second entry point. We then did the same process being careful not to violate the sacral foramen. At this level we placed a 35 mm bolt filled with a mixture of autologous bone morselized allograft. These were also followed in excellent position under all 3 fluoroscopic techniques. We then placed our third small screw in the most inferior position. We drilled with a smaller drill and tapped with a smaller tap at this level and then placed a 45 mm screw along this channel in excellent position. At this time we confirmed good position of all 3 pieces of hardware in lateral and AP inlet and outlet views. All 3 looked excellent. We irrigated the wound copiously and closed in multiple layers of  Vicryl. We then placed Dermabond and Steri-Strips on the skin. Shortness was then applied and the patient was extubated and taken to recovery room in stable condition.

## 2013-06-13 NOTE — Progress Notes (Signed)
Orthopedic Tech Progress Note Patient Details:  [name] 1970/11/25 [id number]  Ortho Devices Type of Ortho Device: Crutches Ortho Device/Splint Interventions: Ordered   [place] Cage, NATASHA T 06/13/2013, 11:23 AM

## 2013-06-14 MED ORDER — OXYCODONE HCL 15 MG PO TABS: 15.0000 mg | ORAL_TABLET | ORAL | Status: DC | PRN

## 2013-06-14 NOTE — Progress Notes (Signed)
Pt. Alert and oriented,follows simple instructions, denies pain. Incision area without swelling, redness or S/S of infection. Voiding adequate clear yellow urine. Moving all extremities well and vitals stable and documented. Patient discharged home with spouse. Lumbar surgery notes instructions given to patient and family member for home safety and precautions. Pt. and family stated understanding of instructions given. Pian medication given prior to discharge per patient request.

## 2013-06-14 NOTE — Discharge Summary (Signed)
  Physician Discharge Summary  Patient ID: [name] MRN: [id number] DOB/AGE: [birth year] 42 y.o.  Admit date: 06/13/2013 Discharge date: 06/14/2013  Admission Diagnoses:  Discharge Diagnoses:  Active Problems:   Sacroiliac dysfunction   Discharged Condition: good  Hospital Course: Surgery one day for SI fusion. Did well with marked improvement in pre op pain. Wound looks excellent. No new neuro issues. Home pod 1, specific instructions given.  Consults: None  Significant Diagnostic Studies: none  Treatments: surgery: Left SI fusion  Discharge Exam: Blood pressure 109/67, pulse 78, temperature 97.4 F (36.3 C), temperature source Oral, resp. rate 20, SpO2 96.00%. Incision/Wound:clean and dry  Disposition: 01-Home or Self Care     Medication List    ASK your doctor about these medications       amLODipine 10 MG tablet  Commonly known as:  NORVASC  Take 10 mg by mouth every morning.     amphetamine-dextroamphetamine 20 MG tablet  Commonly known as:  ADDERALL  Take 20 mg by mouth daily as needed (attention/alertness).     atenolol 25 MG tablet  Commonly known as:  TENORMIN  Take 25 mg by mouth daily.     diazepam 10 MG tablet  Commonly known as:  VALIUM  Take 10 mg by mouth 2 (two) times daily as needed for anxiety.     gabapentin 600 MG tablet  Commonly known as:  NEURONTIN  Take 600 mg by mouth 3 (three) times daily.     gemfibrozil 600 MG tablet  Commonly known as:  LOPID  Take 600 mg by mouth daily.     hydrochlorothiazide 25 MG tablet  Commonly known as:  HYDRODIURIL  Take 25 mg by mouth every morning.     mupirocin ointment 2 %  Commonly known as:  BACTROBAN  Place 1 application into the nose 2 (two) times daily.     oxymorphone 10 MG 12 hr tablet  Commonly known as:  OPANA ER  Take 10 mg by mouth every 12 (twelve) hours.     oxymorphone 5 MG tablet  Commonly known as:  OPANA  Take 5 mg by mouth 2 (two) times daily as needed for pain.      sertraline 100 MG tablet  Commonly known as:  ZOLOFT  Take 200 mg by mouth every morning.     traZODone 100 MG tablet  Commonly known as:  DESYREL  Take 100 mg by mouth at bedtime as needed for sleep.         At home rest most of the time. Get up 9 or 10 times each day and take a 15 or 20 minute walk. No riding in the car and to your first postoperative appointment. If you have neck surgery you may shower from the chest down starting on the third postoperative day. If you had back surgery he may start showering on the third postoperative day with saran wrap wrapped around your incisional area 3 times. After the shower remove the saran wrap. Take pain medicine as needed and other medications as instructed. Call my office for an appointment.  SignedReinaldo Meeker, MD 06/14/2013, 10:30 AM

## 2013-06-24 ENCOUNTER — Encounter (HOSPITAL_COMMUNITY): Payer: Self-pay | Admitting: Neurosurgery

## 2014-11-06 ENCOUNTER — Other Ambulatory Visit: Payer: Self-pay | Admitting: Neurosurgery

## 2014-11-06 DIAGNOSIS — M159 Polyosteoarthritis, unspecified: Secondary | ICD-10-CM

## 2014-11-06 DIAGNOSIS — M5412 Radiculopathy, cervical region: Secondary | ICD-10-CM

## 2014-11-14 ENCOUNTER — Ambulatory Visit
Admission: RE | Admit: 2014-11-14 | Discharge: 2014-11-14 | Disposition: A | Discharge status: Home / Self Care | Payer: 59 | Source: Ambulatory Visit | Attending: Neurosurgery | Admitting: Neurosurgery

## 2014-11-14 VITALS — BP 138/77 | HR 85

## 2014-11-14 DIAGNOSIS — M159 Polyosteoarthritis, unspecified: Secondary | ICD-10-CM

## 2014-11-14 DIAGNOSIS — M5412 Radiculopathy, cervical region: Secondary | ICD-10-CM

## 2014-11-14 DIAGNOSIS — M533 Sacrococcygeal disorders, not elsewhere classified: Secondary | ICD-10-CM

## 2014-11-14 MED ORDER — HYDROMORPHONE HCL 4 MG PO TABS
4.0000 mg | ORAL_TABLET | Freq: Once | ORAL | Status: AC
  Administered 2014-11-14: 4 mg via ORAL

## 2014-11-14 MED ORDER — HYDROMORPHONE HCL 8 MG PO TABS
8.0000 mg | ORAL_TABLET | Freq: Once | ORAL | Status: AC
  Administered 2014-11-14: 8 mg via ORAL

## 2014-11-14 MED ORDER — DIAZEPAM 5 MG PO TABS
10.0000 mg | ORAL_TABLET | Freq: Once | ORAL | Status: AC
  Administered 2014-11-14: 10 mg via ORAL

## 2014-11-14 MED ORDER — IOHEXOL 300 MG/ML  SOLN
10.0000 mL | Freq: Once | INTRAMUSCULAR | Status: AC | PRN
  Administered 2014-11-14: 10 mL via INTRATHECAL

## 2014-11-14 NOTE — Progress Notes (Signed)
Patient states he has been off Adderall, Trazodone and Zoloft for at least the past two days. jkl

## 2014-11-14 NOTE — Discharge Instructions (Signed)
Myelogram Discharge Instructions  1. Go home and rest quietly for the next 24 hours.  It is important to lie flat for the next 24 hours.  Get up only to go to the restroom.  You may lie in the bed or on a couch on your back, your stomach, your left side or your right side.  You may have one pillow under your head.  You may have pillows between your knees while you are on your side or under your knees while you are on your back.  2. DO NOT drive today.  Recline the seat as far back as it will go, while still wearing your seat belt, on the way home.  3. You may get up to go to the bathroom as needed.  You may sit up for 10 minutes to eat.  You may resume your normal diet and medications unless otherwise indicated.  Drink plenty of extra fluids today and tomorrow.  4. The incidence of a spinal headache with nausea and/or vomiting is about 5% (one in 20 patients).  If you develop a headache, lie flat and drink plenty of fluids until the headache goes away.  Caffeinated beverages may be helpful.  If you develop severe nausea and vomiting or a headache that does not go away with flat bed rest, call [phone].  5. You may resume normal activities after your 24 hours of bed rest is over; however, do not exert yourself strongly or do any heavy lifting tomorrow.  6. Call your physician for a follow-up appointment.   You may resume Adderall, Trazodone and Zoloft on Saturday, Nov 15, 2014 after 9:30a.m.

## 2014-11-17 ENCOUNTER — Other Ambulatory Visit: Payer: Self-pay | Admitting: Neurosurgery

## 2014-12-10 ENCOUNTER — Inpatient Hospital Stay (HOSPITAL_COMMUNITY)
Admission: RE | Admit: 2014-12-10 | Discharge: 2014-12-10 | Disposition: A | Discharge status: Home / Self Care | Payer: 59 | Source: Ambulatory Visit

## 2014-12-15 ENCOUNTER — Encounter (HOSPITAL_COMMUNITY)
Admission: RE | Admit: 2014-12-15 | Discharge: 2014-12-15 | Disposition: A | Discharge status: Home / Self Care | Payer: 59 | Source: Ambulatory Visit | Attending: Neurosurgery | Admitting: Neurosurgery

## 2014-12-15 ENCOUNTER — Encounter (HOSPITAL_COMMUNITY): Payer: Self-pay

## 2014-12-15 DIAGNOSIS — Z981 Arthrodesis status: Secondary | ICD-10-CM | POA: Diagnosis not present

## 2014-12-15 DIAGNOSIS — M5022 Other cervical disc displacement, mid-cervical region: Secondary | ICD-10-CM | POA: Diagnosis not present

## 2014-12-15 DIAGNOSIS — I10 Essential (primary) hypertension: Secondary | ICD-10-CM | POA: Diagnosis not present

## 2014-12-15 DIAGNOSIS — Z880 Allergy status to penicillin: Secondary | ICD-10-CM | POA: Diagnosis not present

## 2014-12-15 DIAGNOSIS — F329 Major depressive disorder, single episode, unspecified: Secondary | ICD-10-CM | POA: Diagnosis not present

## 2014-12-15 DIAGNOSIS — F419 Anxiety disorder, unspecified: Secondary | ICD-10-CM | POA: Diagnosis not present

## 2014-12-15 DIAGNOSIS — Z888 Allergy status to other drugs, medicaments and biological substances status: Secondary | ICD-10-CM | POA: Diagnosis not present

## 2014-12-15 LAB — BASIC METABOLIC PANEL
Anion gap: 12 (ref 5–15)
BUN: 14 mg/dL (ref 6–20)
CO2: 26 mmol/L (ref 22–32)
Calcium: 9.9 mg/dL (ref 8.9–10.3)
Chloride: 100 mmol/L — ABNORMAL LOW (ref 101–111)
Creatinine, Ser: 0.93 mg/dL (ref 0.61–1.24)
GFR calc Af Amer: >60 mL/min (ref >60)
GFR calc non Af Amer: >60 mL/min (ref >60)
Glucose, Bld: 124 mg/dL — ABNORMAL HIGH (ref 65–99)
Potassium: 3 mmol/L — ABNORMAL LOW (ref 3.5–5.1)
Sodium: 138 mmol/L (ref 135–145)

## 2014-12-15 LAB — CBC
HCT: 37.4 % — ABNORMAL LOW (ref 39.0–52.0)
Hemoglobin: 13.5 g/dL (ref 13.0–17.0)
MCH: 30.4 pg (ref 26.0–34.0)
MCHC: 36.1 g/dL — ABNORMAL HIGH (ref 30.0–36.0)
MCV: 84.2 fL (ref 78.0–100.0)
Platelets: 148 10*3/uL — ABNORMAL LOW (ref 150–400)
RBC: 4.44 MIL/uL (ref 4.22–5.81)
RDW: 14.1 % (ref 11.5–15.5)
WBC: 6.2 10*3/uL (ref 4.0–10.5)

## 2014-12-15 LAB — SURGICAL PCR SCREEN
MRSA, PCR: NEGATIVE
Staphylococcus aureus: NEGATIVE

## 2014-12-15 NOTE — Pre-Procedure Instructions (Addendum)
    KHARTER REXROTH  12/15/2014       Your procedure is scheduled on Thursday, June 23rd  Report to [facility] Admitting at 08:00 A.M.  Call this number if you have problems the morning of surgery:  [phone]   Remember:  Do not eat food or drink liquids after midnight.   Take these medicines the morning of surgery with A SIP OF WATER Amlodipine, Atenolol, Diazepam (if needed), Gabapentin, Oxycodone   STOP/ Do not take Aspirin, Aleve, Naproxen, Advil, Ibuprofen, Motrin, Vitamins, Herbs, or Supplements starting today   Do not wear jewelry, make-up or nail polish.  Do not wear lotions, powders, or colognes.  You may wear deodorant.  Do not shave 48 hours prior to surgery.  Men may shave face and neck.  Do not bring valuables to the hospital.  [facility] is not responsible for any belongings or valuables.  Contacts, dentures or bridgework may not be worn into surgery.  Leave your suitcase in the car.  After surgery it may be brought to your room.  For patients admitted to the hospital, discharge time will be determined by your treatment team.  Patients discharged the day of surgery will not be allowed to drive home.    Special instructions:  See attached.   Please read over the following fact sheets that you were given. Pain Booklet, Coughing and Deep Breathing, MRSA Information and Surgical Site Infection Prevention

## 2014-12-15 NOTE — Progress Notes (Signed)
Patient denies chest pain, shob, cardiologist, or cardiac test. PCP Dr. Everlene Other with [facility].

## 2014-12-15 NOTE — Progress Notes (Signed)
   12/15/14 0830  OBSTRUCTIVE SLEEP APNEA  Have you ever been diagnosed with sleep apnea through a sleep study? No  Do you snore loudly (loud enough to be heard through closed doors)?  0  Do you often feel tired, fatigued, or sleepy during the daytime? 1  Has anyone observed you stop breathing during your sleep? 0  Do you have, or are you being treated for high blood pressure? 1  BMI more than 35 kg/m2? 1  Age over 46 years old? 0  Neck circumference greater than 40 cm/16 inches? 1  Gender: 1  Obstructive Sleep Apnea Score 5

## 2014-12-17 MED ORDER — VANCOMYCIN HCL 10 G IV SOLR
1500.0000 mg | INTRAVENOUS | Status: AC
  Administered 2014-12-18: 1500 mg via INTRAVENOUS
  Filled 2014-12-17: qty 1500

## 2014-12-17 MED ORDER — DEXAMETHASONE SODIUM PHOSPHATE 10 MG/ML IJ SOLN
10.0000 mg | INTRAMUSCULAR | Status: AC
  Administered 2014-12-18: 10 mg via INTRAVENOUS
  Filled 2014-12-17: qty 1

## 2014-12-18 ENCOUNTER — Encounter (HOSPITAL_COMMUNITY)
Admission: RE | Disposition: A | Discharge status: Home / Self Care | Payer: Self-pay | Source: Ambulatory Visit | Attending: Neurosurgery

## 2014-12-18 ENCOUNTER — Ambulatory Visit (HOSPITAL_COMMUNITY): Payer: 59 | Admitting: Certified Registered"

## 2014-12-18 ENCOUNTER — Encounter (HOSPITAL_COMMUNITY): Payer: Self-pay | Admitting: Certified Registered"

## 2014-12-18 ENCOUNTER — Ambulatory Visit (HOSPITAL_COMMUNITY): Payer: 59

## 2014-12-18 ENCOUNTER — Ambulatory Visit (HOSPITAL_COMMUNITY)
Admission: RE | Admit: 2014-12-18 | Discharge: 2014-12-19 | Disposition: A | Discharge status: Home / Self Care | Payer: 59 | Source: Ambulatory Visit | Attending: Neurosurgery | Admitting: Neurosurgery

## 2014-12-18 DIAGNOSIS — Z981 Arthrodesis status: Secondary | ICD-10-CM | POA: Insufficient documentation

## 2014-12-18 DIAGNOSIS — M5022 Other cervical disc displacement, mid-cervical region: Secondary | ICD-10-CM | POA: Diagnosis not present

## 2014-12-18 DIAGNOSIS — I10 Essential (primary) hypertension: Secondary | ICD-10-CM | POA: Insufficient documentation

## 2014-12-18 DIAGNOSIS — F329 Major depressive disorder, single episode, unspecified: Secondary | ICD-10-CM | POA: Insufficient documentation

## 2014-12-18 DIAGNOSIS — Z419 Encounter for procedure for purposes other than remedying health state, unspecified: Secondary | ICD-10-CM

## 2014-12-18 DIAGNOSIS — F419 Anxiety disorder, unspecified: Secondary | ICD-10-CM | POA: Insufficient documentation

## 2014-12-18 DIAGNOSIS — Z880 Allergy status to penicillin: Secondary | ICD-10-CM | POA: Insufficient documentation

## 2014-12-18 DIAGNOSIS — Z888 Allergy status to other drugs, medicaments and biological substances status: Secondary | ICD-10-CM | POA: Insufficient documentation

## 2014-12-18 DIAGNOSIS — M5020 Other cervical disc displacement, unspecified cervical region: Secondary | ICD-10-CM | POA: Diagnosis present

## 2014-12-18 HISTORY — PX: ANTERIOR CERVICAL DECOMP/DISCECTOMY FUSION: SHX1161

## 2014-12-18 SURGERY — ANTERIOR CERVICAL DECOMPRESSION/DISCECTOMY FUSION 1 LEVEL
Anesthesia: General | Site: Neck

## 2014-12-18 MED ORDER — HYDROMORPHONE HCL 1 MG/ML IJ SOLN
INTRAMUSCULAR | Status: AC
  Filled 2014-12-18: qty 1

## 2014-12-18 MED ORDER — ONDANSETRON HCL 4 MG/2ML IJ SOLN
INTRAMUSCULAR | Status: DC | PRN
  Administered 2014-12-18: 4 mg via INTRAVENOUS

## 2014-12-18 MED ORDER — LIDOCAINE HCL (CARDIAC) 20 MG/ML IV SOLN
INTRAVENOUS | Status: AC
  Filled 2014-12-18: qty 5

## 2014-12-18 MED ORDER — AMLODIPINE BESYLATE 10 MG PO TABS
10.0000 mg | ORAL_TABLET | Freq: Every day | ORAL | Status: DC
  Filled 2014-12-18: qty 1

## 2014-12-18 MED ORDER — PANTOPRAZOLE SODIUM 40 MG PO TBEC
40.0000 mg | DELAYED_RELEASE_TABLET | Freq: Every day | ORAL | Status: DC
  Administered 2014-12-18: 40 mg via ORAL

## 2014-12-18 MED ORDER — VANCOMYCIN HCL 10 G IV SOLR
1500.0000 mg | Freq: Once | INTRAVENOUS | Status: AC
  Administered 2014-12-18: 1500 mg via INTRAVENOUS
  Filled 2014-12-18: qty 1500

## 2014-12-18 MED ORDER — HEMOSTATIC AGENTS (NO CHARGE) OPTIME
TOPICAL | Status: DC | PRN
  Administered 2014-12-18: 1 via TOPICAL

## 2014-12-18 MED ORDER — GLYCOPYRROLATE 0.2 MG/ML IJ SOLN
INTRAMUSCULAR | Status: DC | PRN
  Administered 2014-12-18: 0.4 mg via INTRAVENOUS

## 2014-12-18 MED ORDER — DEXAMETHASONE SODIUM PHOSPHATE 4 MG/ML IJ SOLN: 4.0000 mg | Freq: Four times a day (QID) | INTRAMUSCULAR | Status: AC

## 2014-12-18 MED ORDER — ROCURONIUM BROMIDE 50 MG/5ML IV SOLN
INTRAVENOUS | Status: AC
  Filled 2014-12-18: qty 1

## 2014-12-18 MED ORDER — DOCUSATE SODIUM 100 MG PO CAPS
100.0000 mg | ORAL_CAPSULE | Freq: Two times a day (BID) | ORAL | Status: DC
  Administered 2014-12-18: 100 mg via ORAL
  Filled 2014-12-18: qty 1

## 2014-12-18 MED ORDER — DEXAMETHASONE 4 MG PO TABS
4.0000 mg | ORAL_TABLET | Freq: Four times a day (QID) | ORAL | Status: AC
  Administered 2014-12-18 – 2014-12-19 (×2): 4 mg via ORAL
  Filled 2014-12-18 (×2): qty 1

## 2014-12-18 MED ORDER — ACETAMINOPHEN 325 MG PO TABS: 650.0000 mg | ORAL_TABLET | ORAL | Status: DC | PRN

## 2014-12-18 MED ORDER — CYCLOBENZAPRINE HCL 10 MG PO TABS
10.0000 mg | ORAL_TABLET | Freq: Three times a day (TID) | ORAL | Status: DC | PRN
  Administered 2014-12-18 (×2): 10 mg via ORAL
  Filled 2014-12-18: qty 1

## 2014-12-18 MED ORDER — AMPHETAMINE-DEXTROAMPHETAMINE 10 MG PO TABS: 20.0000 mg | ORAL_TABLET | Freq: Every day | ORAL | Status: DC | PRN

## 2014-12-18 MED ORDER — LACTATED RINGERS IV SOLN
INTRAVENOUS | Status: DC
  Administered 2014-12-18 (×2): via INTRAVENOUS

## 2014-12-18 MED ORDER — PANTOPRAZOLE SODIUM 40 MG IV SOLR
40.0000 mg | Freq: Every day | INTRAVENOUS | Status: DC
  Filled 2014-12-18: qty 40

## 2014-12-18 MED ORDER — LIDOCAINE HCL (CARDIAC) 20 MG/ML IV SOLN
INTRAVENOUS | Status: DC | PRN
  Administered 2014-12-18: 60 mg via INTRAVENOUS

## 2014-12-18 MED ORDER — OXYCODONE-ACETAMINOPHEN 5-325 MG PO TABS
1.0000 | ORAL_TABLET | ORAL | Status: DC | PRN
  Administered 2014-12-18 – 2014-12-19 (×5): 2 via ORAL
  Filled 2014-12-18 (×4): qty 2

## 2014-12-18 MED ORDER — ROCURONIUM BROMIDE 100 MG/10ML IV SOLN
INTRAVENOUS | Status: DC | PRN
  Administered 2014-12-18 (×2): 10 mg via INTRAVENOUS
  Administered 2014-12-18: 50 mg via INTRAVENOUS

## 2014-12-18 MED ORDER — SODIUM CHLORIDE 0.9 % IJ SOLN: 3.0000 mL | INTRAMUSCULAR | Status: DC | PRN

## 2014-12-18 MED ORDER — ONDANSETRON HCL 4 MG/2ML IJ SOLN: 4.0000 mg | INTRAMUSCULAR | Status: DC | PRN

## 2014-12-18 MED ORDER — ATENOLOL 25 MG PO TABS
25.0000 mg | ORAL_TABLET | Freq: Every day | ORAL | Status: DC
  Filled 2014-12-18: qty 1

## 2014-12-18 MED ORDER — PROPOFOL 10 MG/ML IV BOLUS
INTRAVENOUS | Status: DC | PRN
  Administered 2014-12-18: 150 mg via INTRAVENOUS

## 2014-12-18 MED ORDER — SERTRALINE HCL 100 MG PO TABS
200.0000 mg | ORAL_TABLET | Freq: Every day | ORAL | Status: DC
  Administered 2014-12-18: 200 mg via ORAL
  Filled 2014-12-18 (×2): qty 2

## 2014-12-18 MED ORDER — BISACODYL 5 MG PO TBEC
5.0000 mg | DELAYED_RELEASE_TABLET | Freq: Every day | ORAL | Status: DC | PRN
  Filled 2014-12-18: qty 1

## 2014-12-18 MED ORDER — PHENOL 1.4 % MT LIQD: 1.0000 | OROMUCOSAL | Status: DC | PRN

## 2014-12-18 MED ORDER — 0.9 % SODIUM CHLORIDE (POUR BTL) OPTIME
TOPICAL | Status: DC | PRN
  Administered 2014-12-18: 1000 mL

## 2014-12-18 MED ORDER — HYDROMORPHONE HCL 1 MG/ML IJ SOLN
1.0000 mg | INTRAMUSCULAR | Status: DC | PRN
  Administered 2014-12-18 (×3): 1 mg via INTRAMUSCULAR
  Filled 2014-12-18 (×2): qty 1

## 2014-12-18 MED ORDER — THROMBIN 5000 UNITS EX SOLR
CUTANEOUS | Status: DC | PRN
  Administered 2014-12-18 (×2): 5000 [IU] via TOPICAL

## 2014-12-18 MED ORDER — MIDAZOLAM HCL 2 MG/2ML IJ SOLN
INTRAMUSCULAR | Status: AC
  Filled 2014-12-18: qty 2

## 2014-12-18 MED ORDER — HYDROMORPHONE HCL 1 MG/ML IJ SOLN
0.2500 mg | INTRAMUSCULAR | Status: DC | PRN
  Administered 2014-12-18 (×4): 0.5 mg via INTRAVENOUS

## 2014-12-18 MED ORDER — PROPOFOL 10 MG/ML IV BOLUS
INTRAVENOUS | Status: AC
  Filled 2014-12-18: qty 20

## 2014-12-18 MED ORDER — SODIUM CHLORIDE 0.9 % IR SOLN
Status: DC | PRN
  Administered 2014-12-18: 10:00:00

## 2014-12-18 MED ORDER — KCL IN DEXTROSE-NACL 20-5-0.45 MEQ/L-%-% IV SOLN
80.0000 mL/h | INTRAVENOUS | Status: DC
Start: 2014-12-18 — End: 2014-12-19
  Filled 2014-12-18 (×3): qty 1000

## 2014-12-18 MED ORDER — ACETAMINOPHEN 650 MG RE SUPP: 650.0000 mg | RECTAL | Status: DC | PRN

## 2014-12-18 MED ORDER — MENTHOL 3 MG MT LOZG: 1.0000 | LOZENGE | OROMUCOSAL | Status: DC | PRN

## 2014-12-18 MED ORDER — HYDROMORPHONE HCL 1 MG/ML IJ SOLN
INTRAMUSCULAR | Status: AC
  Administered 2014-12-18: 0.5 mg via INTRAVENOUS
  Filled 2014-12-18: qty 1

## 2014-12-18 MED ORDER — TRAZODONE HCL 100 MG PO TABS
100.0000 mg | ORAL_TABLET | Freq: Every evening | ORAL | Status: DC | PRN
  Filled 2014-12-18: qty 1

## 2014-12-18 MED ORDER — ONDANSETRON HCL 4 MG/2ML IJ SOLN
INTRAMUSCULAR | Status: AC
  Filled 2014-12-18: qty 2

## 2014-12-18 MED ORDER — MIDAZOLAM HCL 5 MG/5ML IJ SOLN
INTRAMUSCULAR | Status: DC | PRN
  Administered 2014-12-18: 2 mg via INTRAVENOUS

## 2014-12-18 MED ORDER — NEOSTIGMINE METHYLSULFATE 10 MG/10ML IV SOLN
INTRAVENOUS | Status: DC | PRN
  Administered 2014-12-18: 3 mg via INTRAVENOUS

## 2014-12-18 MED ORDER — SODIUM CHLORIDE 0.9 % IJ SOLN
3.0000 mL | Freq: Two times a day (BID) | INTRAMUSCULAR | Status: DC
  Administered 2014-12-18: 3 mL via INTRAVENOUS

## 2014-12-18 MED ORDER — FENTANYL CITRATE (PF) 250 MCG/5ML IJ SOLN
INTRAMUSCULAR | Status: DC | PRN
  Administered 2014-12-18 (×2): 100 ug via INTRAVENOUS
  Administered 2014-12-18: 50 ug via INTRAVENOUS

## 2014-12-18 MED ORDER — CYCLOBENZAPRINE HCL 10 MG PO TABS
ORAL_TABLET | ORAL | Status: AC
  Filled 2014-12-18: qty 1

## 2014-12-18 MED ORDER — NEOSTIGMINE METHYLSULFATE 10 MG/10ML IV SOLN
INTRAVENOUS | Status: AC
  Filled 2014-12-18: qty 1

## 2014-12-18 MED ORDER — GABAPENTIN 600 MG PO TABS
600.0000 mg | ORAL_TABLET | Freq: Three times a day (TID) | ORAL | Status: DC
  Administered 2014-12-18 (×2): 600 mg via ORAL
  Filled 2014-12-18 (×5): qty 1

## 2014-12-18 MED ORDER — PROMETHAZINE HCL 25 MG/ML IJ SOLN: 6.2500 mg | INTRAMUSCULAR | Status: DC | PRN

## 2014-12-18 MED ORDER — FENTANYL CITRATE (PF) 250 MCG/5ML IJ SOLN
INTRAMUSCULAR | Status: AC
  Filled 2014-12-18: qty 5

## 2014-12-18 MED ORDER — GLYCOPYRROLATE 0.2 MG/ML IJ SOLN
INTRAMUSCULAR | Status: AC
  Filled 2014-12-18: qty 2

## 2014-12-18 MED ORDER — SODIUM CHLORIDE 0.9 % IV SOLN
250.0000 mL | INTRAVENOUS | Status: DC
Start: 2014-12-18 — End: 2014-12-19

## 2014-12-18 MED ORDER — GEMFIBROZIL 600 MG PO TABS
600.0000 mg | ORAL_TABLET | Freq: Every day | ORAL | Status: DC
  Administered 2014-12-18 – 2014-12-19 (×2): 600 mg via ORAL
  Filled 2014-12-18 (×3): qty 1

## 2014-12-18 MED ORDER — OXYCODONE-ACETAMINOPHEN 5-325 MG PO TABS
ORAL_TABLET | ORAL | Status: AC
  Filled 2014-12-18: qty 2

## 2014-12-18 MED ORDER — SCOPOLAMINE 1 MG/3DAYS TD PT72
MEDICATED_PATCH | TRANSDERMAL | Status: AC
  Filled 2014-12-18: qty 1

## 2014-12-18 MED ORDER — HYDROCHLOROTHIAZIDE 25 MG PO TABS
25.0000 mg | ORAL_TABLET | Freq: Every day | ORAL | Status: DC
  Filled 2014-12-18: qty 1

## 2014-12-18 SURGICAL SUPPLY — 63 items
APL SKNCLS STERI-STRIP NONHPOA (GAUZE/BANDAGES/DRESSINGS) ×1
BAG DECANTER FOR FLEXI CONT (MISCELLANEOUS) ×2 IMPLANT
BENZOIN TINCTURE PRP APPL 2/3 (GAUZE/BANDAGES/DRESSINGS) ×4 IMPLANT
BIT DRILL TRINICA 2.3MM (BIT) IMPLANT
BRUSH SCRUB EZ PLAIN DRY (MISCELLANEOUS) ×2 IMPLANT
BUR MATCHSTICK NEURO 3.0 LAGG (BURR) ×2 IMPLANT
CANISTER SUCT 3000ML PPV (MISCELLANEOUS) ×2 IMPLANT
CONT SPEC 4OZ CLIKSEAL STRL BL (MISCELLANEOUS) ×2 IMPLANT
DRAPE C-ARM 42X72 X-RAY (DRAPES) ×4 IMPLANT
DRAPE LAPAROTOMY 100X72 PEDS (DRAPES) ×2 IMPLANT
DRAPE MICROSCOPE LEICA (MISCELLANEOUS) ×2 IMPLANT
DRAPE POUCH INSTRU U-SHP 10X18 (DRAPES) ×2 IMPLANT
DRAPE SURG 17X23 STRL (DRAPES) ×4 IMPLANT
DRILL BIT TRINICA 2.3MM (BIT) ×4
DRSG OPSITE POSTOP 4X6 (GAUZE/BANDAGES/DRESSINGS) ×1 IMPLANT
DRSG TELFA 3X8 NADH (GAUZE/BANDAGES/DRESSINGS) IMPLANT
DURAPREP 6ML APPLICATOR 50/CS (WOUND CARE) ×2 IMPLANT
ELECT COATED BLADE 2.86 ST (ELECTRODE) ×2 IMPLANT
ELECT REM PT RETURN 9FT ADLT (ELECTROSURGICAL) ×2
ELECTRODE REM PT RTRN 9FT ADLT (ELECTROSURGICAL) ×1 IMPLANT
GAUZE SPONGE 4X4 12PLY STRL (GAUZE/BANDAGES/DRESSINGS) ×1 IMPLANT
GAUZE SPONGE 4X4 16PLY XRAY LF (GAUZE/BANDAGES/DRESSINGS) IMPLANT
GLOVE BIO SURGEON STRL SZ8 (GLOVE) ×1 IMPLANT
GLOVE BIO SURGEON STRL SZ8.5 (GLOVE) ×1 IMPLANT
GLOVE ECLIPSE 8.0 STRL XLNG CF (GLOVE) ×2 IMPLANT
GLOVE EXAM NITRILE LRG STRL (GLOVE) IMPLANT
GLOVE EXAM NITRILE XL STR (GLOVE) IMPLANT
GLOVE EXAM NITRILE XS STR PU (GLOVE) IMPLANT
GLOVE INDICATOR 7.5 STRL GRN (GLOVE) ×2 IMPLANT
GLOVE SURG SS PI 7.0 STRL IVOR (GLOVE) ×3 IMPLANT
GOWN STRL REUS W/ TWL LRG LVL3 (GOWN DISPOSABLE) IMPLANT
GOWN STRL REUS W/ TWL XL LVL3 (GOWN DISPOSABLE) ×1 IMPLANT
GOWN STRL REUS W/TWL 2XL LVL3 (GOWN DISPOSABLE) ×1 IMPLANT
GOWN STRL REUS W/TWL LRG LVL3 (GOWN DISPOSABLE) ×4
GOWN STRL REUS W/TWL XL LVL3 (GOWN DISPOSABLE) ×2
HALTER HD/CHIN CERV TRACTION D (MISCELLANEOUS) ×2 IMPLANT
INTERBODY TM 11X14X7-7DEG ANG (Metal Cage) ×1 IMPLANT
KIT BASIN OR (CUSTOM PROCEDURE TRAY) ×2 IMPLANT
KIT ROOM TURNOVER OR (KITS) ×2 IMPLANT
NDL SPNL 20GX3.5 QUINCKE YW (NEEDLE) ×1 IMPLANT
NEEDLE SPNL 20GX3.5 QUINCKE YW (NEEDLE) ×2 IMPLANT
NS IRRIG 1000ML POUR BTL (IV SOLUTION) ×2 IMPLANT
PACK LAMINECTOMY NEURO (CUSTOM PROCEDURE TRAY) ×2 IMPLANT
PAD ARMBOARD 7.5X6 YLW CONV (MISCELLANEOUS) ×2 IMPLANT
PAD DRESSING TELFA 3X8 NADH (GAUZE/BANDAGES/DRESSINGS) ×1 IMPLANT
PATTIES SURGICAL .25X.25 (GAUZE/BANDAGES/DRESSINGS) IMPLANT
PATTIES SURGICAL .75X.75 (GAUZE/BANDAGES/DRESSINGS) ×2 IMPLANT
PLATE 22MM (Plate) ×1 IMPLANT
PUTTY BONE GRAFT KIT 2.5ML (Bone Implant) ×1 IMPLANT
RUBBERBAND STERILE (MISCELLANEOUS) ×4 IMPLANT
SCREW SELF DRILL FIXED 14MM (Screw) ×4 IMPLANT
SPACER TMS 11X14X6MM (Spacer) IMPLANT
SPONGE INTESTINAL PEANUT (DISPOSABLE) ×2 IMPLANT
SPONGE SURGIFOAM ABS GEL SZ50 (HEMOSTASIS) ×2 IMPLANT
STRIP CLOSURE SKIN 1/2X4 (GAUZE/BANDAGES/DRESSINGS) ×2 IMPLANT
SUT PDS AB 5-0 P3 18 (SUTURE) ×2 IMPLANT
SUT VIC AB 3-0 CP2 18 (SUTURE) ×2 IMPLANT
SYR 20ML ECCENTRIC (SYRINGE) IMPLANT
TAPE STRIPS DRAPE STRL (GAUZE/BANDAGES/DRESSINGS) ×1 IMPLANT
TOWEL OR 17X24 6PK STRL BLUE (TOWEL DISPOSABLE) ×2 IMPLANT
TOWEL OR 17X26 10 PK STRL BLUE (TOWEL DISPOSABLE) ×2 IMPLANT
TRAP SPECIMEN MUCOUS 40CC (MISCELLANEOUS) ×1 IMPLANT
WATER STERILE IRR 1000ML POUR (IV SOLUTION) ×2 IMPLANT

## 2014-12-18 NOTE — Progress Notes (Signed)
Orthopedic Tech Progress Note Patient Details:  [name] [birth year] [id number] Patient already has soft collar. Patient ID: [name], male   DOB: 25-Dec-1970, 44 y.o.   MRN: [id number]   [name] 12/18/2014, 5:07 PM

## 2014-12-18 NOTE — Progress Notes (Signed)
ANTIBIOTIC CONSULT NOTE  Pharmacy Consult for vancomycin Indication: surgical prophylaxis  Allergies  Allergen Reactions  . Lisinopril Anaphylaxis  . Penicillins Hives    Patient Measurements: Height:  (172.7 cm) Weight: 233 lb (105.688 kg) IBW/kg (Calculated) : 68.4 Adjusted Body Weight:   Vital Signs: Temp: 98.2 F (36.8 C) (06/23 1307) Temp Source: Oral (06/23 0828) BP: 111/82 mmHg (06/23 1307) Pulse Rate: 75 (06/23 1307) Intake/Output from previous day:   Intake/Output from this shift: Total I/O In: 1150 [I.V.:1150] Out: 50 [Blood:50]  Labs: No results for input(s): WBC, HGB, PLT, LABCREA, CREATININE in the last 72 hours. Estimated Creatinine Clearance: 116.9 mL/min (by C-G formula based on Cr of 0.93). No results for input(s): VANCOTROUGH, VANCOPEAK, VANCORANDOM, GENTTROUGH, GENTPEAK, GENTRANDOM, TOBRATROUGH, TOBRAPEAK, TOBRARND, AMIKACINPEAK, AMIKACINTROU, AMIKACIN in the last 72 hours.   Microbiology: Recent Results (from the past 720 hour(s))  Surgical pcr screen     Status: None   Collection Time: 12/15/14  9:07 AM  Result Value Ref Range Status   MRSA, PCR NEGATIVE NEGATIVE Final   Staphylococcus aureus NEGATIVE NEGATIVE Final    Comment:        The Xpert SA Assay (FDA approved for NASAL specimens in patients over 95 years of age), is one component of a comprehensive surveillance program.  Test performance has been validated by [facility] for patients greater than or equal to 37 year old. It is not intended to diagnose infection nor to guide or monitor treatment.     Medical History: Past Medical History  Diagnosis Date  . Hypertension   . Arthritis   . Anxiety   . Depression   . 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 amLODipine (NORVASC) 10 MG tablet Take 10 mg by mouth every morning.     12/18/2014 at 0730  . amphetamine-dextroamphetamine (ADDERALL)  20 MG tablet Take 20 mg by mouth daily as needed (attention/alertness).    12/17/2014 at Unknown time  . atenolol (TENORMIN) 25 MG tablet Take 25 mg by mouth daily.     12/18/2014 at 0730  . diazepam (VALIUM) 10 MG tablet Take 10 mg by mouth 2 (two) times daily as needed for anxiety.    Past Week at Unknown time  . gabapentin (NEURONTIN) 600 MG tablet Take 600 mg by mouth 3 (three) times daily.    12/18/2014 at 0730  . gemfibrozil (LOPID) 600 MG tablet Take 600 mg by mouth daily.   12/17/2014 at Unknown time  . hydrochlorothiazide (HYDRODIURIL) 25 MG tablet Take 25 mg by mouth every morning.     12/17/2014 at Unknown time  . Oxycodone HCl 10 MG TABS Take 10 mg by mouth 3 (three) times daily as needed (Pain).   Past Week at Unknown time  . sertraline (ZOLOFT) 100 MG tablet Take 200 mg by mouth every morning.     12/17/2014 at Unknown time  . tiZANidine (ZANAFLEX) 4 MG capsule Take 4 mg by mouth 3 (three) times daily as needed.   4 12/17/2014 at Unknown time  . traZODone (DESYREL) 100 MG tablet Take 100 mg by mouth at bedtime as needed for sleep.    Past Week at Unknown time   Assessment: 44 yo male s/p cervical decompression/discectomy. WBC 6.2, afebrile, no drain/hemovac.  Goal of Therapy:  Vancomycin trough level 10-15 mcg/ml  Plan:  Vancomycin 1500 mg IV x1  Pharmacy will sign off, please re-consult as needed.  Agapito Games, PharmD, BCPS Clinical Pharmacist Pager: [phone] 12/18/2014 1:21 PM

## 2014-12-18 NOTE — Anesthesia Procedure Notes (Signed)
Procedure Name: Intubation Date/Time: 12/18/2014 9:49 AM Performed by: Julian Reil Pre-anesthesia Checklist: Patient identified, Emergency Drugs available, Suction available and Patient being monitored Patient Re-evaluated:Patient Re-evaluated prior to inductionOxygen Delivery Method: Circle system utilized Preoxygenation: Pre-oxygenation with 100% oxygen Intubation Type: IV induction Ventilation: Mask ventilation without difficulty Laryngoscope Size: Mac and 4 Grade View: Grade I Tube type: Oral Tube size: 7.5 mm Number of attempts: 1 Airway Equipment and Method: Stylet and LTA kit utilized Placement Confirmation: ETT inserted through vocal cords under direct vision,  positive ETCO2 and breath sounds checked- equal and bilateral Secured at: 22 cm Tube secured with: Tape Dental Injury: Teeth and Oropharynx as per pre-operative assessment

## 2014-12-18 NOTE — Anesthesia Preprocedure Evaluation (Addendum)
Anesthesia Evaluation  Patient identified by MRN, date of birth, ID band Patient awake    Reviewed: Allergy & Precautions, NPO status , Patient's Chart, lab work & pertinent test results  History of Anesthesia Complications Negative for: history of anesthetic complications  Airway Mallampati: II  TM Distance: >3 FB Neck ROM: Full    Dental  (+) Teeth Intact, Dental Advisory Given   Pulmonary neg pulmonary ROS,    Pulmonary exam normal       Cardiovascular hypertension, Pt. on home beta blockers and Pt. on medications Normal cardiovascular exam    Neuro/Psych PSYCHIATRIC DISORDERS Anxiety Depression    GI/Hepatic negative GI ROS, Neg liver ROS,   Endo/Other  Morbid obesity  Renal/GU negative Renal ROS     Musculoskeletal   Abdominal   Peds  Hematology   Anesthesia Other Findings   Reproductive/Obstetrics                            Anesthesia Physical Anesthesia Plan  ASA: III  Anesthesia Plan: General   Post-op Pain Management:    Induction: Intravenous  Airway Management Planned: Oral ETT  Additional Equipment:   Intra-op Plan:   Post-operative Plan: Extubation in OR  Informed Consent: I have reviewed the patients History and Physical, chart, labs and discussed the procedure including the risks, benefits and alternatives for the proposed anesthesia with the patient or authorized representative who has indicated his/her understanding and acceptance.   Dental advisory given  Plan Discussed with: CRNA, Anesthesiologist and Surgeon  Anesthesia Plan Comments:        Anesthesia Quick Evaluation

## 2014-12-18 NOTE — Transfer of Care (Signed)
Immediate Anesthesia Transfer of Care Note  Patient: [name]  Procedure(s) Performed: Procedure(s) with comments: ANTERIOR CERVICAL DECOMPRESSION/DISCECTOMY FUSION CERVICAL FIVE-SIX (N/A) - ANTERIOR CERVICAL DECOMPRESSION/DISCECTOMY FUSION CERVICAL FIVE-SIX  Patient Location: PACU  Anesthesia Type:General  Level of Consciousness: awake, alert , oriented and patient cooperative  Airway & Oxygen Therapy: Patient Spontanous Breathing and Patient connected to nasal cannula oxygen  Post-op Assessment: Report given to RN, Post -op Vital signs reviewed and stable and Patient moving all extremities  Post vital signs: Reviewed and stable  Last Vitals:  Filed Vitals:   12/18/14 0828  BP: 131/79  Pulse: 80  Temp: 36.7 C  Resp: 20    Complications: No apparent anesthesia complications

## 2014-12-18 NOTE — Anesthesia Postprocedure Evaluation (Signed)
Anesthesia Post Note  Patient: [name]  Procedure(s) Performed: Procedure(s) (LRB): ANTERIOR CERVICAL DECOMPRESSION/DISCECTOMY FUSION CERVICAL FIVE-SIX (N/A)  Anesthesia type: general  Patient location: PACU  Post pain: Pain level controlled  Post assessment: Patient's Cardiovascular Status Stable  Last Vitals:  Filed Vitals:   12/18/14 1307  BP: 111/82  Pulse: 75  Temp: 36.8 C  Resp: 16    Post vital signs: Reviewed and stable  Level of consciousness: sedated  Complications: No apparent anesthesia complications
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
## 2014-12-18 NOTE — Op Note (Signed)
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 Localizing fluoroscopy was used prior to incision to identify the appropriate level. Transverse incision was made in the right anterior neck started the midline and headed towards the medial aspect of the sternal cremaster muscle. The platysma muscle was then incised transversely and the natural fascial plane between the strap muscles medially and the sternal cremaster laterally was identified and followed down to the anterior aspect the cervical spine. The longus coli muscles were identified and split in the midline and stripped away bilaterally with unipolar coagulation and Kitner dissection. Self-retaining tract was placed for exposure Dr. should approach the appropriate level. Using a 15 blade the S the disc at C5-6 was incised. Using pituitary rongeurs and curettes approximately 90% of the disc material was removed. High-speed drill was used to widen the interspace and bony shavings were saved for use later in the case. At this time the microscope was draped brought in the field and used for the remainder of the case. Using microsecond technique the remainder of the disc material down the posterior longitudinal ligament was removed. Ligament was then incised transversely and the cut edges removed a Kerrison punch. Thorough decompression was carried out on the spinal dura into the foramen bilaterally with the C6 nerve roots were thoroughly decompressed as they exited their foramen. There is large amounts of disc material pushed on the nerve root on the right this was removed to thoroughly decompressed and visualized. At this time inspection  was carried out in all directions for any evidence of residual compression and none could be identified. Irrigation was carried out and any bleeding control proper coagulation Gelfoam. Measurements were taken and a 7 mm trabecular metal graft was chosen and filled with a mixture of autologous bone and morselized allograft. After confirming hemostasis once more and was impacted difficulty and fossae showed to be in good position. An appropriately length Trinica anterior cervical plate was then chosen. It was placed in good position and then 414 mm self drilling screws were placed without difficulty. The locking mechanism was rotated locked position final fluoroscopy showed good position of the plates screws and plugs. Irrigation was carried out and any bleeding controlled with upper coagulation. The was then closed with inverted Vicryls on the platysma muscle and subcuticular layer. Steri-Strips were placed on the skin. A sterile dressing was then applied and the patient was extubated and taken to recovery room in stable condition.

## 2014-12-19 ENCOUNTER — Encounter (HOSPITAL_COMMUNITY): Payer: Self-pay | Admitting: Neurosurgery

## 2014-12-19 DIAGNOSIS — M5022 Other cervical disc displacement, mid-cervical region: Secondary | ICD-10-CM | POA: Diagnosis not present

## 2014-12-19 MED ORDER — OXYCODONE HCL 10 MG PO TABS
10.0000 mg | ORAL_TABLET | ORAL | Status: DC | PRN
Start: 2014-12-19 — End: 2015-06-07

## 2014-12-19 NOTE — Discharge Summary (Signed)
  Physician Discharge Summary  Patient ID: AADVIK HOERL MRN: [id number] DOB/AGE: 1970/12/18 44 y.o.  Admit date: 12/18/2014 Discharge date: 12/19/2014  Admission Diagnoses:  Discharge Diagnoses:  Active Problems:   Herniated cervical disc   Discharged Condition: good  Hospital Course: Surgery yesterday for 1 level acdf. Did very well with no pain post op. Wound fine. Ambulated well. Home pod 1, specific instructions given.  Consults: None  Significant Diagnostic Studies: none  Treatments: surgery: C 56 acdf  Discharge Exam: Blood pressure 119/77, pulse 77, temperature 97.9 F (36.6 C), temperature source Oral, resp. rate 18, height 5\' 8"  (1.727 m), weight 105.688 kg (233 lb), SpO2 94 %. Incision/Wound:clean and dry; no new neuro issues  Disposition: 01-Home or Self Care     Medication List    ASK your doctor about these medications        amLODipine 10 MG tablet  Commonly known as:  NORVASC  Take 10 mg by mouth every morning.     amphetamine-dextroamphetamine 20 MG tablet  Commonly known as:  ADDERALL  Take 20 mg by mouth daily as needed (attention/alertness).     atenolol 25 MG tablet  Commonly known as:  TENORMIN  Take 25 mg by mouth daily.     diazepam 10 MG tablet  Commonly known as:  VALIUM  Take 10 mg by mouth 2 (two) times daily as needed for anxiety.     gabapentin 600 MG tablet  Commonly known as:  NEURONTIN  Take 600 mg by mouth 3 (three) times daily.     gemfibrozil 600 MG tablet  Commonly known as:  LOPID  Take 600 mg by mouth daily.     hydrochlorothiazide 25 MG tablet  Commonly known as:  HYDRODIURIL  Take 25 mg by mouth every morning.     Oxycodone HCl 10 MG Tabs  Take 10 mg by mouth 3 (three) times daily as needed (Pain).     sertraline 100 MG tablet  Commonly known as:  ZOLOFT  Take 200 mg by mouth every morning.     tiZANidine 4 MG capsule  Commonly known as:  ZANAFLEX  Take 4 mg by mouth 3 (three) times daily as needed.      traZODone 100 MG tablet  Commonly known as:  DESYREL  Take 100 mg by mouth at bedtime as needed for sleep.         At home rest most of the time. Get up 9 or 10 times each day and take a 15 or 20 minute walk. No riding in the car and to your first postoperative appointment. If you have neck surgery you may shower from the chest down starting on the third postoperative day. If you had back surgery he may start showering on the third postoperative day with saran wrap wrapped around your incisional area 3 times. After the shower remove the saran wrap. Take pain medicine as needed and other medications as instructed. Call my office for an appointment.  SignedReinaldo Meeker, MD 12/19/2014, 8:55 AM

## 2014-12-19 NOTE — Progress Notes (Signed)
Pt doing well. Pt given D/C instructions with Rx, verbal understanding was provided. Pt's incision is clean and dry with no sign of infection. Pt's IV was removed prior to D/C. Pt D/C'd home via walking @ 1115 per MD order. Pt is stable @ D/C and has no other needs at this time. Rema Fendt, RN

## 2014-12-19 NOTE — Discharge Instructions (Signed)
Wound Care °Keep incision covered and dry for one week. °You may shower from the neck down. °You may remove outer bandage after one week.  °Do not put any creams, lotions, or ointments on incision. °Leave steri-strips on neck.  They will fall off by themselves. °Activity °Walk each and every day, increasing distance each day. °No lifting greater than 5 lbs.  Avoid excessive neck motions.. °No driving or riding in car until further notice at follow up appointment. °If provided with neck brace, wear at all times unless instructed otherwise. °Diet °Resume your normal diet.  °Return to Work °Will be discussed at you follow up appointment. °Call Your Doctor If Any of These Occur °Redness, drainage, or swelling at the wound.  °Temperature greater than 101 degrees. °Severe pain not relieved by pain medication. °Incision starts to come apart. °Increased difficulty swallowing. °Follow Up Appt °Call today for appointment in 1-2 weeks ([phone]) or for problems.  If you have any hardware placed in your spine, you will need an x-ray before your appointment. ° °Anterior Cervical Diskectomy and Fusion °Anterior cervical diskectomy is surgery done on the upper spine to relieve pressure on one or more nerve roots, or on the spinal cord. There are 7 bones in your neck, called the cervical spine. These 7 bones (vertebrae) sit one on top of the other. Cushions (intervertebral disks) separate the vertebrae and act like shock absorbers. As we age, degeneration of our bones, joints, and disks can cause neck pain and tightening around the spinal cord and nerve roots. This causes arm pain and weakness.  °Degeneration involves: °· Herniated Disk. With age, the disks dry up and can rupture. In this condition, the center of the disk bulges out (disk herniation). This can cause pressure on a nerve, which produces pain or weakness in the arm. °· Bone spurs and spinal stenosis. As we age, growths often develop on our bones. These growths are  called bone spurs (osteophytes). A bone spur is a collection of calcium. As bone spurs grow and extend, the vertebral openings become narrow. The spinal canal and/or the foramen (opening for nerve passageways) become smaller. This narrowing (stenosis) may cause pinching (compression) of the spinal cord or the spinal nerve root. The nerve injury can cause pain, weakness, numbness, and loss of coordination in the upper limbs. Often, patients have difficulty with their hand writing or they start dropping things, because their hand grip is weaker. The spinal cord damage can cause increased stiffness, more frequent falls, electric shooting pain, and changes in bowel and bladder control. °Degeneration in the neck results in three common problems: °· Radiculopathy - Nerve compression that results in weakness or pain that radiates down the arm. °· Myelopathy - Spinal cord compression that causes stiffness, difficulty with walking, coordination, and trouble with bowel or bladder habits. °· Neck pain - Worn out joints cause pain as the neck moves. °Treatment: °· Radiculopathy - Surgery is performed to remove the bony and disk material that is pushing on the nerve. °· Myelopathy - Surgery is performed to remove the bony and disk material that pushes on the spinal cord. °· Neck pain - Surgery is performed to combine (fuse) the joints of the neck together, so they cannot move or cause pain. °Surgery can be done from the front or the back of the neck. When it is done from the front, it is called an anterior (front) cervical (neck) diskectomy (removal of the disk) and fusion. °LET YOUR CAREGIVER KNOW ABOUT:  °·   Recent infections. °· Any shooting pains down your leg, when you move your neck. °· Any difficulty swallowing. °· A smoking history. °· Use of blood thinners or anti-inflammatory medicines. °· Any history of injury to your shoulders. °· Any history of injury to your vocal cords. °· Any foreign objects in your body from a  previous surgery. °· Any recent fevers or illness. °· Past medical history (diabetes, strokes). °· Past problems with anesthetics. °· Possibility of pregnancy. °· History of blood clots (deep vein thrombosis). °· History of bleeding or blood problems. °· Past surgeries. °· Other health problems. °· Allergies. °· Medicines you take, including herbs, eye drops, over-the-counter medicines, and creams. °· Use of steroids (by mouth or creams). °RISKS AND COMPLICATIONS °· Infection. °· Bleeding. °· Injury to the following structures: °¨ Carotid artery. This can result in a stroke or significant amount of bleeding. °¨ Esophagus, resulting in difficulty swallowing. °¨ Recurrent laryngeal nerve, resulting in hoarseness of the voice. °¨ Spinal cord injury, ranging from mild to complete quadriparesis (muscle weakness in all four limbs). °¨ Nerve root injury, resulting in muscle weakness in the upper limb. °¨ Leakage of cerebrospinal fluid. °BEFORE THE PROCEDURE  °· You will be given medicine to help you sleep (general anesthetic), and a breathing tube will be placed. °· You will be given antibiotics to keep the infection rate down. °· The incision site on your neck will be marked. °· Your neck will be cleaned, to reduce the risk of infection. °PROCEDURE  °An anterior cervical fusion means that the operation is done through the front (anterior) part of your neck. The cut made by the surgeon (incision) is usually within a skin fold line on the neck. After pushing aside the neck muscles, the surgeon removes the affected, degenerated disk and bone spurs (osteophytes), which takes the pressure off the nerves and spinal cord. This is called a decompression. The area where the disk was removed is then filled with a small piece of plastic. This plastic takes the place of the disk and keeps the nerve passageway (foramen) open and clear for the nerves. In most cases, the surgeon uses metal plates or pins (hardware) in the neck, to help  stabilize the level being fused. The hardware reduces motion at that level, so it can fuse. This provides extra support to the neck. A cervical fusion procedure takes anywhere from a couple to several hours, depending on the size of the neck, history of previous surgery, and number of levels being fused. °AFTER THE PROCEDURE  °· You will likely spend 24-48 hours in the hospital. During this time, your caregivers will look for any signs of complications from the procedure. °· Your caregiver will watch you, to make sure that fluid draining from the surgery slows down. It is important that a large mass of blood does not form in your neck, which would cause difficulty with breathing. °· You will get 24 hours of antibiotics. °· You can start to eat as soon as you feel comfortable. °· Once you have started eating, walking, urinating (voiding) and having bowel movements on your own, your caregiver will discharge you home. °HOME CARE INSTRUCTIONS  °· For 2 weeks, do not soak the incision site under water. Do not swim or take baths. Showers are okay, but rinse off the incision sites. °· Do not over exert yourself. Allow time for the incision to heal. °· It can take from 6 weeks to 6 months for fusion to take effect. Your   caregiver may ask you to wear a neck collar during this time, as they check the fusion with multiple (serial) X-rays. °Document Released: 06/01/2009 Document Revised: 10/08/2012 Document Reviewed: 06/01/2009 °Exit[employer]® Patient Information ©2015 [employer]. This information is not intended to replace advice given to you by your health care provider. Make sure you discuss any questions you have with your health care provider. ° °

## 2015-02-12 ENCOUNTER — Other Ambulatory Visit (HOSPITAL_COMMUNITY): Payer: Self-pay | Admitting: Neurosurgery

## 2015-02-12 DIAGNOSIS — M533 Sacrococcygeal disorders, not elsewhere classified: Secondary | ICD-10-CM

## 2015-02-20 ENCOUNTER — Encounter (HOSPITAL_COMMUNITY): Payer: 59

## 2015-02-20 ENCOUNTER — Encounter (HOSPITAL_COMMUNITY)
Admission: RE | Admit: 2015-02-20 | Discharge: 2015-02-20 | Disposition: A | Discharge status: Home / Self Care | Payer: 59 | Source: Ambulatory Visit | Attending: Neurosurgery | Admitting: Neurosurgery

## 2015-02-26 ENCOUNTER — Encounter (HOSPITAL_COMMUNITY): Admission: RE | Admit: 2015-02-26 | Payer: 59 | Source: Ambulatory Visit

## 2015-02-26 ENCOUNTER — Encounter (HOSPITAL_COMMUNITY)
Admission: RE | Admit: 2015-02-26 | Discharge: 2015-02-26 | Disposition: A | Discharge status: Home / Self Care | Payer: 59 | Source: Ambulatory Visit | Attending: Neurosurgery | Admitting: Neurosurgery

## 2015-02-26 DIAGNOSIS — M25552 Pain in left hip: Secondary | ICD-10-CM | POA: Insufficient documentation

## 2015-02-26 DIAGNOSIS — M545 Low back pain: Secondary | ICD-10-CM | POA: Diagnosis not present

## 2015-02-26 DIAGNOSIS — M533 Sacrococcygeal disorders, not elsewhere classified: Secondary | ICD-10-CM

## 2015-02-26 MED ORDER — TECHNETIUM TC 99M MEDRONATE IV KIT
26.3000 | PACK | Freq: Once | INTRAVENOUS | Status: AC | PRN
  Administered 2015-02-26: 26.3 via INTRAVENOUS

## 2015-06-04 ENCOUNTER — Inpatient Hospital Stay (HOSPITAL_COMMUNITY)
Admission: EM | Admit: 2015-06-04 | Discharge: 2015-06-07 | DRG: 918 | Disposition: A | Discharge status: Other Acute Inpatient Hospital | Payer: 59 | Attending: Internal Medicine | Admitting: Internal Medicine

## 2015-06-04 ENCOUNTER — Encounter (HOSPITAL_COMMUNITY): Payer: Self-pay | Admitting: *Deleted

## 2015-06-04 DIAGNOSIS — F1010 Alcohol abuse, uncomplicated: Secondary | ICD-10-CM | POA: Diagnosis present

## 2015-06-04 DIAGNOSIS — Z79899 Other long term (current) drug therapy: Secondary | ICD-10-CM

## 2015-06-04 DIAGNOSIS — I10 Essential (primary) hypertension: Secondary | ICD-10-CM | POA: Diagnosis present

## 2015-06-04 DIAGNOSIS — Z880 Allergy status to penicillin: Secondary | ICD-10-CM

## 2015-06-04 DIAGNOSIS — T391X2A Poisoning by 4-Aminophenol derivatives, intentional self-harm, initial encounter: Secondary | ICD-10-CM

## 2015-06-04 DIAGNOSIS — M1990 Unspecified osteoarthritis, unspecified site: Secondary | ICD-10-CM | POA: Diagnosis present

## 2015-06-04 DIAGNOSIS — F1410 Cocaine abuse, uncomplicated: Secondary | ICD-10-CM | POA: Diagnosis present

## 2015-06-04 DIAGNOSIS — F32A Depression, unspecified: Secondary | ICD-10-CM | POA: Diagnosis present

## 2015-06-04 DIAGNOSIS — F329 Major depressive disorder, single episode, unspecified: Secondary | ICD-10-CM | POA: Diagnosis present

## 2015-06-04 DIAGNOSIS — M549 Dorsalgia, unspecified: Secondary | ICD-10-CM | POA: Diagnosis present

## 2015-06-04 DIAGNOSIS — T50912A Poisoning by multiple unspecified drugs, medicaments and biological substances, intentional self-harm, initial encounter: Secondary | ICD-10-CM

## 2015-06-04 DIAGNOSIS — R944 Abnormal results of kidney function studies: Secondary | ICD-10-CM | POA: Diagnosis present

## 2015-06-04 DIAGNOSIS — T391X1A Poisoning by 4-Aminophenol derivatives, accidental (unintentional), initial encounter: Secondary | ICD-10-CM | POA: Diagnosis present

## 2015-06-04 DIAGNOSIS — E860 Dehydration: Secondary | ICD-10-CM | POA: Diagnosis present

## 2015-06-04 DIAGNOSIS — N289 Disorder of kidney and ureter, unspecified: Secondary | ICD-10-CM | POA: Diagnosis present

## 2015-06-04 DIAGNOSIS — Z811 Family history of alcohol abuse and dependence: Secondary | ICD-10-CM

## 2015-06-04 DIAGNOSIS — R45851 Suicidal ideations: Secondary | ICD-10-CM

## 2015-06-04 DIAGNOSIS — G4700 Insomnia, unspecified: Secondary | ICD-10-CM | POA: Diagnosis not present

## 2015-06-04 DIAGNOSIS — E876 Hypokalemia: Secondary | ICD-10-CM | POA: Diagnosis present

## 2015-06-04 DIAGNOSIS — G894 Chronic pain syndrome: Secondary | ICD-10-CM | POA: Diagnosis present

## 2015-06-04 DIAGNOSIS — F419 Anxiety disorder, unspecified: Secondary | ICD-10-CM | POA: Diagnosis present

## 2015-06-04 DIAGNOSIS — Z888 Allergy status to other drugs, medicaments and biological substances status: Secondary | ICD-10-CM

## 2015-06-04 DIAGNOSIS — R197 Diarrhea, unspecified: Secondary | ICD-10-CM | POA: Diagnosis present

## 2015-06-04 DIAGNOSIS — R440 Auditory hallucinations: Secondary | ICD-10-CM

## 2015-06-04 LAB — CBC
HCT: 39.7 % (ref 39.0–52.0)
Hemoglobin: 14.3 g/dL (ref 13.0–17.0)
MCH: 31 pg (ref 26.0–34.0)
MCHC: 36 g/dL (ref 30.0–36.0)
MCV: 85.9 fL (ref 78.0–100.0)
Platelets: 220 10*3/uL (ref 150–400)
RBC: 4.62 MIL/uL (ref 4.22–5.81)
RDW: 14.8 % (ref 11.5–15.5)
WBC: 7.8 10*3/uL (ref 4.0–10.5)
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
## 2015-06-04 NOTE — ED Notes (Signed)
Spoke with [employer], Lawson FiscalLori, will fax over poison control info on  Amlodipine overdose, monitor for symptoms that arise.  Spoke with staffing, aware of need for Suicide Sitter.
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
## 2015-06-04 NOTE — ED Notes (Signed)
Pt aware urine sample is needed 

## 2015-06-05 DIAGNOSIS — T391X2A Poisoning by 4-Aminophenol derivatives, intentional self-harm, initial encounter: Secondary | ICD-10-CM | POA: Diagnosis present

## 2015-06-05 DIAGNOSIS — T391X1A Poisoning by 4-Aminophenol derivatives, accidental (unintentional), initial encounter: Secondary | ICD-10-CM | POA: Diagnosis present

## 2015-06-05 DIAGNOSIS — M549 Dorsalgia, unspecified: Secondary | ICD-10-CM | POA: Diagnosis present

## 2015-06-05 DIAGNOSIS — F1010 Alcohol abuse, uncomplicated: Secondary | ICD-10-CM | POA: Diagnosis present

## 2015-06-05 DIAGNOSIS — F32A Depression, unspecified: Secondary | ICD-10-CM | POA: Diagnosis present

## 2015-06-05 DIAGNOSIS — R197 Diarrhea, unspecified: Secondary | ICD-10-CM | POA: Diagnosis not present

## 2015-06-05 DIAGNOSIS — F419 Anxiety disorder, unspecified: Secondary | ICD-10-CM | POA: Diagnosis present

## 2015-06-05 DIAGNOSIS — R45851 Suicidal ideations: Secondary | ICD-10-CM

## 2015-06-05 DIAGNOSIS — Z888 Allergy status to other drugs, medicaments and biological substances status: Secondary | ICD-10-CM | POA: Diagnosis not present

## 2015-06-05 DIAGNOSIS — M1990 Unspecified osteoarthritis, unspecified site: Secondary | ICD-10-CM | POA: Diagnosis present

## 2015-06-05 DIAGNOSIS — F1410 Cocaine abuse, uncomplicated: Secondary | ICD-10-CM | POA: Diagnosis present

## 2015-06-05 DIAGNOSIS — R944 Abnormal results of kidney function studies: Secondary | ICD-10-CM | POA: Diagnosis present

## 2015-06-05 DIAGNOSIS — F332 Major depressive disorder, recurrent severe without psychotic features: Secondary | ICD-10-CM | POA: Diagnosis not present

## 2015-06-05 DIAGNOSIS — F329 Major depressive disorder, single episode, unspecified: Secondary | ICD-10-CM | POA: Diagnosis present

## 2015-06-05 DIAGNOSIS — T1491 Suicide attempt: Secondary | ICD-10-CM | POA: Diagnosis not present

## 2015-06-05 DIAGNOSIS — G894 Chronic pain syndrome: Secondary | ICD-10-CM | POA: Diagnosis present

## 2015-06-05 DIAGNOSIS — E876 Hypokalemia: Secondary | ICD-10-CM | POA: Diagnosis present

## 2015-06-05 DIAGNOSIS — I10 Essential (primary) hypertension: Secondary | ICD-10-CM | POA: Diagnosis present

## 2015-06-05 DIAGNOSIS — Z880 Allergy status to penicillin: Secondary | ICD-10-CM | POA: Diagnosis not present

## 2015-06-05 DIAGNOSIS — G4700 Insomnia, unspecified: Secondary | ICD-10-CM | POA: Diagnosis not present

## 2015-06-05 DIAGNOSIS — E860 Dehydration: Secondary | ICD-10-CM | POA: Diagnosis present

## 2015-06-05 DIAGNOSIS — R440 Auditory hallucinations: Secondary | ICD-10-CM | POA: Diagnosis present

## 2015-06-05 DIAGNOSIS — N289 Disorder of kidney and ureter, unspecified: Secondary | ICD-10-CM | POA: Diagnosis present

## 2015-06-05 DIAGNOSIS — Z811 Family history of alcohol abuse and dependence: Secondary | ICD-10-CM | POA: Diagnosis not present

## 2015-06-05 DIAGNOSIS — Z79899 Other long term (current) drug therapy: Secondary | ICD-10-CM | POA: Diagnosis not present

## 2015-06-05 LAB — MAGNESIUM: Magnesium: 2.2 mg/dL (ref 1.7–2.4)

## 2015-06-05 LAB — COMPREHENSIVE METABOLIC PANEL
ALT: 18 U/L (ref 17–63)
ALT: 21 U/L (ref 17–63)
AST: 16 U/L (ref 15–41)
AST: 23 U/L (ref 15–41)
Albumin: 3.9 g/dL (ref 3.5–5.0)
Albumin: 4.8 g/dL (ref 3.5–5.0)
Alkaline Phosphatase: 104 U/L (ref 38–126)
Alkaline Phosphatase: 77 U/L (ref 38–126)
Anion gap: 11 (ref 5–15)
Anion gap: 9 (ref 5–15)
BUN: 16 mg/dL (ref 6–20)
BUN: 20 mg/dL (ref 6–20)
CO2: 19 mmol/L — ABNORMAL LOW (ref 22–32)
CO2: 22 mmol/L (ref 22–32)
Calcium: 8.6 mg/dL — ABNORMAL LOW (ref 8.9–10.3)
Calcium: 9.8 mg/dL (ref 8.9–10.3)
Chloride: 109 mmol/L (ref 101–111)
Chloride: 112 mmol/L — ABNORMAL HIGH (ref 101–111)
Creatinine, Ser: 0.98 mg/dL (ref 0.61–1.24)
Creatinine, Ser: 1.32 mg/dL — ABNORMAL HIGH (ref 0.61–1.24)
GFR calc Af Amer: >60 mL/min (ref >60)
GFR calc Af Amer: >60 mL/min (ref >60)
GFR calc non Af Amer: >60 mL/min (ref >60)
GFR calc non Af Amer: >60 mL/min (ref >60)
Glucose, Bld: 110 mg/dL — ABNORMAL HIGH (ref 65–99)
Glucose, Bld: 113 mg/dL — ABNORMAL HIGH (ref 65–99)
Potassium: 3.2 mmol/L — ABNORMAL LOW (ref 3.5–5.1)
Potassium: 3.3 mmol/L — ABNORMAL LOW (ref 3.5–5.1)
Sodium: 140 mmol/L (ref 135–145)
Sodium: 142 mmol/L (ref 135–145)
Total Bilirubin: 0.6 mg/dL (ref 0.3–1.2)
Total Bilirubin: 0.8 mg/dL (ref 0.3–1.2)
Total Protein: 7 g/dL (ref 6.5–8.1)
Total Protein: 8.5 g/dL — ABNORMAL HIGH (ref 6.5–8.1)

## 2015-06-05 LAB — CBC
HCT: 36 % — ABNORMAL LOW (ref 39.0–52.0)
Hemoglobin: 12.5 g/dL — ABNORMAL LOW (ref 13.0–17.0)
MCH: 30.1 pg (ref 26.0–34.0)
MCHC: 34.7 g/dL (ref 30.0–36.0)
MCV: 86.7 fL (ref 78.0–100.0)
Platelets: 174 10*3/uL (ref 150–400)
RBC: 4.15 MIL/uL — ABNORMAL LOW (ref 4.22–5.81)
RDW: 14.8 % (ref 11.5–15.5)
WBC: 6.3 10*3/uL (ref 4.0–10.5)

## 2015-06-05 LAB — PROTIME-INR
INR: 1.15 (ref 0.00–1.49)
INR: 1.23 (ref 0.00–1.49)
Prothrombin Time: 14.9 seconds (ref 11.6–15.2)
Prothrombin Time: 15.6 seconds — ABNORMAL HIGH (ref 11.6–15.2)

## 2015-06-05 LAB — RAPID URINE DRUG SCREEN, HOSP PERFORMED
Amphetamines: NOT DETECTED
Barbiturates: NOT DETECTED
Benzodiazepines: POSITIVE — AB
Cocaine: POSITIVE — AB
Opiates: NOT DETECTED
Tetrahydrocannabinol: NOT DETECTED

## 2015-06-05 LAB — GLUCOSE, CAPILLARY
Glucose-Capillary: 103 mg/dL — ABNORMAL HIGH (ref 65–99)
Glucose-Capillary: 84 mg/dL (ref 65–99)

## 2015-06-05 LAB — CREATININE, SERUM
Creatinine, Ser: 0.96 mg/dL (ref 0.61–1.24)
GFR calc Af Amer: >60 mL/min (ref >60)
GFR calc non Af Amer: >60 mL/min (ref >60)

## 2015-06-05 LAB — ACETAMINOPHEN LEVEL
Acetaminophen (Tylenol), Serum: 16 ug/mL (ref 10–30)
Acetaminophen (Tylenol), Serum: 79 ug/mL — ABNORMAL HIGH (ref 10–30)

## 2015-06-05 LAB — MRSA PCR SCREENING: MRSA by PCR: NEGATIVE

## 2015-06-05 LAB — SALICYLATE LEVEL: Salicylate Lvl: <4 mg/dL (ref 2.8–30.0)

## 2015-06-05 LAB — ETHANOL: Alcohol, Ethyl (B): <5 mg/dL (ref <5)

## 2015-06-05 MED ORDER — ACETYLCYSTEINE LOAD VIA INFUSION
150.0000 mg/kg | Freq: Once | INTRAVENOUS | Status: AC
Start: 2015-06-05 — End: 2015-06-05
  Administered 2015-06-05 (×2): 15645 mg via INTRAVENOUS
  Filled 2015-06-05: qty 392

## 2015-06-05 MED ORDER — LORAZEPAM 2 MG/ML IJ SOLN
2.0000 mg | INTRAMUSCULAR | Status: DC | PRN
  Administered 2015-06-05 (×2): 2 mg via INTRAVENOUS
  Filled 2015-06-05 (×2): qty 1

## 2015-06-05 MED ORDER — FOLIC ACID 1 MG PO TABS
1.0000 mg | ORAL_TABLET | Freq: Every day | ORAL | Status: DC
  Administered 2015-06-05 – 2015-06-07 (×3): 1 mg via ORAL
  Filled 2015-06-05 (×3): qty 1

## 2015-06-05 MED ORDER — SODIUM CHLORIDE 0.9 % IV SOLN
1000.0000 mL | INTRAVENOUS | Status: DC
  Administered 2015-06-05 – 2015-06-06 (×2): 1000 mL via INTRAVENOUS

## 2015-06-05 MED ORDER — POTASSIUM CHLORIDE CRYS ER 20 MEQ PO TBCR
40.0000 meq | EXTENDED_RELEASE_TABLET | Freq: Once | ORAL | Status: AC
  Administered 2015-06-05: 40 meq via ORAL

## 2015-06-05 MED ORDER — LORAZEPAM 2 MG/ML IJ SOLN
1.0000 mg | Freq: Four times a day (QID) | INTRAMUSCULAR | Status: DC | PRN
  Administered 2015-06-05 – 2015-06-06 (×2): 1 mg via INTRAVENOUS
  Filled 2015-06-05 (×2): qty 1

## 2015-06-05 MED ORDER — SODIUM CHLORIDE 0.9 % IV SOLN
1000.0000 mL | Freq: Once | INTRAVENOUS | Status: AC
  Administered 2015-06-05: 1000 mL via INTRAVENOUS

## 2015-06-05 MED ORDER — GEMFIBROZIL 600 MG PO TABS
600.0000 mg | ORAL_TABLET | Freq: Every day | ORAL | Status: DC
  Filled 2015-06-05 (×2): qty 1

## 2015-06-05 MED ORDER — LORAZEPAM 1 MG PO TABS
2.0000 mg | ORAL_TABLET | ORAL | Status: DC | PRN
Start: 2015-06-05 — End: 2015-06-05

## 2015-06-05 MED ORDER — INFLUENZA VAC SPLIT QUAD 0.5 ML IM SUSY
0.5000 mL | PREFILLED_SYRINGE | INTRAMUSCULAR | Status: DC
  Filled 2015-06-05 (×2): qty 0.5

## 2015-06-05 MED ORDER — DEXTROSE 5 % IV SOLN
15.0000 mg/kg/h | INTRAVENOUS | Status: AC
  Administered 2015-06-05 (×2): 15 mg/kg/h via INTRAVENOUS
  Filled 2015-06-05 (×3): qty 200

## 2015-06-05 MED ORDER — LORAZEPAM 1 MG PO TABS
1.0000 mg | ORAL_TABLET | Freq: Four times a day (QID) | ORAL | Status: DC | PRN
  Administered 2015-06-07: 1 mg via ORAL
  Filled 2015-06-05: qty 1

## 2015-06-05 MED ORDER — ADULT MULTIVITAMIN W/MINERALS CH
1.0000 | ORAL_TABLET | Freq: Every day | ORAL | Status: DC
  Administered 2015-06-05 – 2015-06-07 (×3): 1 via ORAL
  Filled 2015-06-05 (×3): qty 1

## 2015-06-05 MED ORDER — ONDANSETRON HCL 4 MG/2ML IJ SOLN
4.0000 mg | Freq: Once | INTRAMUSCULAR | Status: AC
  Administered 2015-06-05: 4 mg via INTRAVENOUS
  Filled 2015-06-05: qty 2

## 2015-06-05 MED ORDER — PNEUMOCOCCAL VAC POLYVALENT 25 MCG/0.5ML IJ INJ
0.5000 mL | INJECTION | INTRAMUSCULAR | Status: DC
  Filled 2015-06-05 (×2): qty 0.5

## 2015-06-05 MED ORDER — LORAZEPAM 1 MG PO TABS
0.0000 mg | ORAL_TABLET | Freq: Four times a day (QID) | ORAL | Status: DC
  Administered 2015-06-05: 4 mg via ORAL
  Administered 2015-06-06 (×3): 2 mg via ORAL
  Administered 2015-06-07: 3 mg via ORAL
  Administered 2015-06-07: 2 mg via ORAL
  Filled 2015-06-05 (×3): qty 2
  Filled 2015-06-05: qty 3
  Filled 2015-06-05: qty 4
  Filled 2015-06-05: qty 2

## 2015-06-05 MED ORDER — LORAZEPAM 1 MG PO TABS: 0.0000 mg | ORAL_TABLET | Freq: Two times a day (BID) | ORAL | Status: DC

## 2015-06-05 MED ORDER — HEPARIN SODIUM (PORCINE) 5000 UNIT/ML IJ SOLN
5000.0000 [IU] | Freq: Three times a day (TID) | INTRAMUSCULAR | Status: DC
  Administered 2015-06-05 – 2015-06-07 (×8): 5000 [IU] via SUBCUTANEOUS
  Filled 2015-06-05 (×10): qty 1

## 2015-06-05 MED ORDER — VITAMIN B-1 100 MG PO TABS
100.0000 mg | ORAL_TABLET | Freq: Every day | ORAL | Status: DC
  Administered 2015-06-05 – 2015-06-07 (×3): 100 mg via ORAL
  Filled 2015-06-05 (×3): qty 1

## 2015-06-05 NOTE — ED Notes (Signed)
Recheck tylenol and liver function blood test at 11:30 tonight. Per Patty at poison control [phone]

## 2015-06-05 NOTE — ED Notes (Signed)
Dr. Preston FleetingGlick [facility]

## 2015-06-05 NOTE — ED Notes (Signed)
Attempted lab draw x 2 but unsuccessful. RN,Janeen made aware. Main lab called to come and draw lab.

## 2015-06-05 NOTE — Progress Notes (Signed)
Recheck tylenol and liver function blood test at 11:30 tonight. Per poison control [phone]

## 2015-06-05 NOTE — ED Notes (Signed)
Charge nurse called to start 20 minute timer.

## 2015-06-05 NOTE — Progress Notes (Signed)
   06/05/15 1400  Clinical Encounter Type  Visited With Patient and family together  Visit Type Initial;Psychological support;Spiritual support;Critical Care  Spiritual Encounters  Spiritual Needs Emotional;Other (Comment) Veterinary surgeon(Pastoral Conversation)  Stress Factors  Patient Stress Factors None identified  Family Stress Factors None identified   The Chaplain visited with the patient during rounds. The patient was in bed and awake upon the Chaplain's arrival. There was a relative at the bedside, a nurse in the room and a sitter present.  The patient was very closed off and answered in brief statements. When asked how he was doing he answered by shrugging his shoulders and saying "alright I guess."  No stressors were identified by the relative or by the patient.  The patient stated that he is [facility]. The Chaplain will follow up at a later time.     Chaplain Clint BolderBrittany Varner M.Div. Spiritual Care and [facility]

## 2015-06-05 NOTE — Plan of Care (Signed)
[facility] PLAN OF CARE NOTE  Patient: [name]   ZOX:[id number]  PCP: [name]  DOB: March 26, 1971  DOA: 06/04/2015    DOS: 06/05/2015   Plan of care:  Patient was seen earlier on 06/05/2015 by my colleague Dr. Welton FlakesKhan.  Reviewed his H&P as well as assessment and plan, agreeing with the same. Consulted psychiatry since the patient's LFTs remained stable and Tylenol level has improved significantly. Complete acetylcysteine per pharmacy. Recheck labs at 11:30 PM. Patient drinks 8-10 beers a day- started on CIWA protocol.  Author: Lynden OxfordPranav Patel, [name] [facility] Pager: [phone] 06/05/2015 1:58 PM   If 7PM-7AM, please contact night-coverage at [email or web address], password [email or web address]

## 2015-06-05 NOTE — Care Management Note (Signed)
Case Management Note  Patient Details  Name: Dean MilianJason H [name] MRN: [id number] Date of Birth: 1970/12/13  Subjective/Objective:                 Overdose with tylenol   Action/Plan:Date: June 05, 2015 Chart reviewed for concurrent status and case management needs. Will continue to follow patient for changes and needs: Marcelle Smilinghonda Davis, RN, BSN, [place]   [phone]   Expected Discharge Date:                  Expected Discharge Plan:  Home/Self Care  In-House Referral:  Clinical Social Work  Discharge planning Services  CM Consult  Post Acute Care Choice:  Hospice Choice offered to:  NA  DME Arranged:  N/A DME Agency:  NA  HH Arranged:  NA HH Agency:  NA  Status of Service:  In process, will continue to follow  Medicare Important Message Given:    Date Medicare IM Given:    Medicare IM give by:    Date Additional Medicare IM Given:    Additional Medicare Important Message give by:     If discussed at Long Length of Stay Meetings, dates discussed:    Additional Comments:  Golda AcreDavis, Rhonda Lynn, RN 06/05/2015, 10:54 AM

## 2015-06-05 NOTE — Consult Note (Signed)
[facility] Consult   Reason for Consult:  Polydrug overdose including Tylenol, substance abuse and chronic pain syndrome Referring Physician:  Dr. Posey Pronto Patient Identification: [name] MRN:  [id number] Principal Diagnosis: Acetaminophen overdose Diagnosis:   Patient Active Problem List   Diagnosis Date Noted  . Acetaminophen overdose [T39.1X4A] 06/05/2015  . HTN (hypertension) [I10] 06/05/2015  . Depression [F32.9] 06/05/2015  . Tylenol overdose [T39.1X4A] 06/05/2015  . Cocaine abuse [F14.10] 06/05/2015  . Alcohol abuse [F10.10] 06/05/2015  . Suicidal ideation [R45.851] 06/05/2015  . 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 Snoring disorder [R06.83] 04/08/2013  . 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 Lumbar stenosis with neurogenic claudication [M48.06] 05/09/2011    Total Time spent with patient: 1 hour  Subjective:   [name] is a 44 y.o. male patient admitted with intentional polydrug overdose as a suicidal attempt.  HPI:  [name] is a 44 y.o. male seen, chart reviewed and case discussed with Dr. Posey Pronto for face-to-face psychiatric consultation and evaluation of increased symptoms of depression, substance abuse and status post suicidal attempt by taking polydrug pharmacy with intention to end his life and did not contact anyone until his wife came home and find out. Patient reported he has been tired of living the way has been living with the multiple drug addictions and not able to get better even after trying multiple rehabilitation so were 10-12 years. Patient stated that he is not getting along well with his wife because of his polysubstance abuse and has been feeling guilty about himself and his inability to get clean. Patient reported at one time is able to stay clean about 7 years. Patient was previously received [facility] and also received treatment at  [facility] for substance rehabilitation Patient was lastly admitted to [facility] for addiction and he was able to stay sober one to 2 days. Reportedly patient was suffered with low back disc prolapse in 2004 and had first surgery in 2005. Since then he has more surgeries without relief and then he decided to go on disability and staff working. Patient is also reported he has multiple admission to the hospital for the acute detox treatment of polysubstance this including alcohol, opioids, benzodiazepines, stimulants like Adderall and cocaine. Patient reported his last use of cocaine was 2 days ago and drank 2 containers of beer after taking prescription medication.   He took several prescription medication including BP meds, gabapentin, zanaflex,  nyquil, zoloft at least 2 weeks supply. Patient was brought here by police and placed on involuntary commitment. He was trying to kill himself he states because he is upset at a lot of stuff he has caused he states. Patient had initial labwork drawn which shows a tylenol level of 79 and this falls in the treatment area based on the time it was taken. He has therefore been started on N acetyl cysteine. Patient urine drug screen is positive for cocaine and benzodiazepines blood alcohol level is not significant.  Past Psychiatric History: Patient has known past history of acute psychiatric hospitalization for detox treatment and has multiple rehabilitation treatments.  Risk to Self: Is patient at risk for suicide?: Yes Risk to Others:   Prior Inpatient Therapy:   Prior Outpatient Therapy:    Past Medical History:  Past Medical History  Diagnosis Date  . Hypertension   . Arthritis   . Anxiety   . Depression   . Retrognathia 04/08/2013  Past Surgical History  Procedure Laterality Date  . Back surgery      5 back surgeries  . Spinal cord stimulator implant    . Nasal sinus surgery    . Lumbar fusion Left 06/13/2013    Procedure: SACROILIAC  JOINT FUSION,LEFT;  Surgeon: Faythe Ghee, MD;  Location: [facility];  Service: Neurosurgery;  Laterality: Left;  left  . Anterior cervical decomp/discectomy fusion N/A 12/18/2014    Procedure: ANTERIOR CERVICAL DECOMPRESSION/DISCECTOMY FUSION CERVICAL FIVE-SIX;  Surgeon: Karie Chimera, MD;  Location: [place] [facility];  Service: Neurosurgery;  Laterality: N/A;  ANTERIOR CERVICAL DECOMPRESSION/DISCECTOMY FUSION CERVICAL FIVE-SIX   Family History: No family history on file. Family Psychiatric  History: significant for alcoholism in his father and maternal grandfather Social History:  History  Alcohol Use  . 0.0 oz/week     History  Drug Use  . Yes    Social History   Social History  . Marital Status: Married    Spouse Name: N/A  . Number of Children: N/A  . Years of Education: 13   Occupational History  . disabled     since 2005   Social History Main Topics  . Smoking status: Never Smoker   . Smokeless tobacco: None  . Alcohol Use: 0.0 oz/week  . Drug Use: Yes  . Sexual Activity: Not Asked   Other Topics Concern  . None   Social History Narrative   Additional Social History:                          Allergies:   Allergies  Allergen Reactions  . Lisinopril Anaphylaxis  . Penicillins Hives    Has patient had a PCN reaction causing immediate rash, facial/tongue/throat swelling, SOB or lightheadedness with hypotension: NoYes Has patient had a PCN reaction causing severe rash involving mucus membranes or skin necrosis: NoNo Has patient had a PCN reaction that required hospitalization NoNo Has patient had a PCN reaction occurring within the last 10 years: NoYes If all of the above answers are "NO", then may proceed with Cephalospori    Labs:  Results for orders placed or performed during the hospital encounter of 06/04/15 (from the past 48 hour(s))  Comprehensive metabolic panel     Status: Abnormal   Collection Time: 06/04/15 11:31 PM  Result Value Ref  Range   Sodium 140 135 - 145 mmol/L   Potassium 3.3 (L) 3.5 - 5.1 mmol/L   Chloride 109 101 - 111 mmol/L   CO2 22 22 - 32 mmol/L   Glucose, Bld 113 (H) 65 - 99 mg/dL   BUN 20 6 - 20 mg/dL   Creatinine, Ser 1.32 (H) 0.61 - 1.24 mg/dL   Calcium 9.8 8.9 - 10.3 mg/dL   Total Protein 8.5 (H) 6.5 - 8.1 g/dL   Albumin 4.8 3.5 - 5.0 g/dL   AST 23 15 - 41 U/L   ALT 21 17 - 63 U/L   Alkaline Phosphatase 104 38 - 126 U/L   Total Bilirubin 0.8 0.3 - 1.2 mg/dL   GFR calc non Af Amer >60 >60 mL/min   GFR calc Af Amer >60 >60 mL/min    Comment: (NOTE) The eGFR has been calculated using the CKD EPI equation. This calculation has not been validated in all clinical situations. eGFR's persistently <60 mL/min signify possible Chronic Kidney Disease.    Anion gap 9 5 - 15  Ethanol (ETOH)     Status: None  Collection Time: 06/04/15 11:31 PM  Result Value Ref Range   Alcohol, Ethyl (B) <5 <5 mg/dL    Comment:        LOWEST DETECTABLE LIMIT FOR SERUM ALCOHOL IS 5 mg/dL FOR MEDICAL PURPOSES ONLY   Salicylate level     Status: None   Collection Time: 06/04/15 11:31 PM  Result Value Ref Range   Salicylate Lvl <2.7 2.8 - 30.0 mg/dL  Acetaminophen level     Status: Abnormal   Collection Time: 06/04/15 11:31 PM  Result Value Ref Range   Acetaminophen (Tylenol), Serum 79 (H) 10 - 30 ug/mL    Comment:        THERAPEUTIC CONCENTRATIONS VARY SIGNIFICANTLY. A RANGE OF 10-30 ug/mL MAY BE AN EFFECTIVE CONCENTRATION FOR MANY PATIENTS. HOWEVER, SOME ARE BEST TREATED AT CONCENTRATIONS OUTSIDE THIS RANGE. ACETAMINOPHEN CONCENTRATIONS >150 ug/mL AT 4 HOURS AFTER INGESTION AND >50 ug/mL AT 12 HOURS AFTER INGESTION ARE OFTEN ASSOCIATED WITH TOXIC REACTIONS.   CBC     Status: None   Collection Time: 06/04/15 11:31 PM  Result Value Ref Range   WBC 7.8 4.0 - 10.5 K/uL   RBC 4.62 4.22 - 5.81 MIL/uL   Hemoglobin 14.3 13.0 - 17.0 g/dL   HCT 39.7 39.0 - 52.0 %   MCV 85.9 78.0 - 100.0 fL   MCH 31.0 26.0  - 34.0 pg   MCHC 36.0 30.0 - 36.0 g/dL   RDW 14.8 11.5 - 15.5 %   Platelets 220 150 - 400 K/uL  Magnesium     Status: None   Collection Time: 06/04/15 11:31 PM  Result Value Ref Range   Magnesium 2.2 1.7 - 2.4 mg/dL  Urine rapid drug screen (hosp performed) (Not at [facility])     Status: Abnormal   Collection Time: 06/05/15  3:17 AM  Result Value Ref Range   Opiates NONE DETECTED NONE DETECTED   Cocaine POSITIVE (A) NONE DETECTED   Benzodiazepines POSITIVE (A) NONE DETECTED   Amphetamines NONE DETECTED NONE DETECTED   Tetrahydrocannabinol NONE DETECTED NONE DETECTED   Barbiturates NONE DETECTED NONE DETECTED    Comment:        DRUG SCREEN FOR MEDICAL PURPOSES ONLY.  IF CONFIRMATION IS NEEDED FOR ANY PURPOSE, NOTIFY LAB WITHIN 5 DAYS.        LOWEST DETECTABLE LIMITS FOR URINE DRUG SCREEN Drug Class       Cutoff (ng/mL) Amphetamine      1000 Barbiturate      200 Benzodiazepine   741 Tricyclics       287 Opiates          300 Cocaine          300 THC              50   Acetaminophen level     Status: None   Collection Time: 06/05/15  5:15 AM  Result Value Ref Range   Acetaminophen (Tylenol), Serum 16 10 - 30 ug/mL    Comment:        THERAPEUTIC CONCENTRATIONS VARY SIGNIFICANTLY. A RANGE OF 10-30 ug/mL MAY BE AN EFFECTIVE CONCENTRATION FOR MANY PATIENTS. HOWEVER, SOME ARE BEST TREATED AT CONCENTRATIONS OUTSIDE THIS RANGE. ACETAMINOPHEN CONCENTRATIONS >150 ug/mL AT 4 HOURS AFTER INGESTION AND >50 ug/mL AT 12 HOURS AFTER INGESTION ARE OFTEN ASSOCIATED WITH TOXIC REACTIONS.   Protime-INR     Status: None   Collection Time: 06/05/15  7:00 AM  Result Value Ref Range   Prothrombin Time 14.9 11.6 - 15.2  seconds   INR 1.15 0.00 - 1.49  CBC     Status: Abnormal   Collection Time: 06/05/15  7:00 AM  Result Value Ref Range   WBC 6.3 4.0 - 10.5 K/uL   RBC 4.15 (L) 4.22 - 5.81 MIL/uL   Hemoglobin 12.5 (L) 13.0 - 17.0 g/dL   HCT 36.0 (L) 39.0 - 52.0 %   MCV 86.7 78.0 - 100.0  fL   MCH 30.1 26.0 - 34.0 pg   MCHC 34.7 30.0 - 36.0 g/dL   RDW 14.8 11.5 - 15.5 %   Platelets 174 150 - 400 K/uL  Creatinine, serum     Status: None   Collection Time: 06/05/15  7:00 AM  Result Value Ref Range   Creatinine, Ser 0.96 0.61 - 1.24 mg/dL   GFR calc non Af Amer >60 >60 mL/min   GFR calc Af Amer >60 >60 mL/min    Comment: (NOTE) The eGFR has been calculated using the CKD EPI equation. This calculation has not been validated in all clinical situations. eGFR's persistently <60 mL/min signify possible Chronic Kidney Disease.   Comprehensive metabolic panel     Status: Abnormal   Collection Time: 06/05/15  7:00 AM  Result Value Ref Range   Sodium 142 135 - 145 mmol/L   Potassium 3.2 (L) 3.5 - 5.1 mmol/L   Chloride 112 (H) 101 - 111 mmol/L   CO2 19 (L) 22 - 32 mmol/L   Glucose, Bld 110 (H) 65 - 99 mg/dL   BUN 16 6 - 20 mg/dL   Creatinine, Ser 0.98 0.61 - 1.24 mg/dL   Calcium 8.6 (L) 8.9 - 10.3 mg/dL   Total Protein 7.0 6.5 - 8.1 g/dL   Albumin 3.9 3.5 - 5.0 g/dL   AST 16 15 - 41 U/L   ALT 18 17 - 63 U/L   Alkaline Phosphatase 77 38 - 126 U/L   Total Bilirubin 0.6 0.3 - 1.2 mg/dL   GFR calc non Af Amer >60 >60 mL/min   GFR calc Af Amer >60 >60 mL/min    Comment: (NOTE) The eGFR has been calculated using the CKD EPI equation. This calculation has not been validated in all clinical situations. eGFR's persistently <60 mL/min signify possible Chronic Kidney Disease.    Anion gap 11 5 - 15  Glucose, capillary     Status: Abnormal   Collection Time: 06/05/15  8:04 AM  Result Value Ref Range   Glucose-Capillary 103 (H) 65 - 99 mg/dL   Comment 1 Notify RN     Current Facility-Administered Medications  Medication Dose Route Frequency Provider Last Rate Last Dose  . 0.9 %  sodium chloride infusion  1,000 mL Intravenous Continuous Delora Fuel, MD 616 mL/hr at 06/05/15 0157 1,000 mL at 06/05/15 0157  . acetylcysteine (ACETADOTE) 40,000 mg in dextrose 5 % 1,000 mL (40  mg/mL) infusion  15 mg/kg/hr Intravenous Continuous Delora Fuel, MD 07.3 mL/hr at 06/05/15 0600 15 mg/kg/hr at 06/05/15 0600  . folic acid (FOLVITE) tablet 1 mg  1 mg Oral Daily Lavina Hamman, MD   1 mg at 06/05/15 0944  . heparin injection 5,000 Units  5,000 Units Subcutaneous 3 times per day Allyne Gee, MD   5,000 Units at 06/05/15 0702  . [START ON 06/06/2015] Influenza vac split quadrivalent PF (FLUARIX) injection 0.5 mL  0.5 mL Intramuscular Tomorrow-1000 Lavina Hamman, MD      . LORazepam (ATIVAN) injection 2-3 mg  2-3 mg Intravenous Q1H PRN  Lavina Hamman, MD   2 mg at 06/05/15 0945  . multivitamin with minerals tablet 1 tablet  1 tablet Oral Daily Lavina Hamman, MD   1 tablet at 06/05/15 0944  . [START ON 06/06/2015] pneumococcal 23 valent vaccine (PNU-IMMUNE) injection 0.5 mL  0.5 mL Intramuscular Tomorrow-1000 Lavina Hamman, MD      . thiamine (VITAMIN B-1) tablet 100 mg  100 mg Oral Daily Lavina Hamman, MD   100 mg at 06/05/15 0944    Musculoskeletal: Strength & Muscle Tone: decreased Gait & Station: normal Patient leans: N/A  Psychiatric Specialty Exam: ROS denied current symptoms of nausea, vomiting, burping, shortness of breath, chest pain but continued to report depression, anxiety and suicidal ideation and guilt  No Fever-chills, No Headache, No changes with Vision or hearing, reports vertigo No problems swallowing food or Liquids, No Chest pain, Cough or Shortness of Breath, No Abdominal pain, No Nausea or Vommitting, Bowel movements are regular, No Blood in stool or Urine, No dysuria, No new skin rashes or bruises, No new joints pains-aches,  No new weakness, tingling, numbness in any extremity, No recent weight gain or loss, No polyuria, polydypsia or polyphagia,   A full 10 point Review of Systems was done, except as stated above, all other Review of Systems were negative.  Blood pressure 147/83, pulse 68, temperature 98.1 F (36.7 C), temperature source  Oral, resp. rate 18, height 5' 8"  (1.727 m), weight 100.9 kg (222 lb 7.1 oz), SpO2 97 %.Body mass index is 33.83 kg/(m^2).  General Appearance: Guarded  Eye Contact::  Good  Speech:  Clear and Coherent  Volume:  Normal  Mood:  Depressed, Hopeless and Worthless  Affect:  Constricted and Depressed  Thought Process:  Coherent and Goal Directed  Orientation:  Full (Time, Place, and Person)  Thought Content:  WDL  Suicidal Thoughts:  Yes.  with intent/plan  Homicidal Thoughts:  No  Memory:  Immediate;   Good Recent;   Good Remote;   Fair  Judgement:  Impaired  Insight:  Fair  Psychomotor Activity:  Decreased  Concentration:  Good  Recall:  Good  Fund of Knowledge:Good  Language: Good  Akathisia:  Negative  Handed:  Right  AIMS (if indicated):     Assets:  Agricultural consultant Housing Leisure Time Resilience Social Support Transportation  ADL's:  Intact  Cognition: WNL  Sleep:      Treatment Plan Summary: Daily contact with patient to assess and evaluate symptoms and progress in treatment and Medication management  Safety concerns: Arboriculturist for alcohol withdrawal symptoms and CIWA protocol  Referred to the unit social worker regarding appropriate inpatient psychiatric placement No psychiatric medication management as he has polydrug overdose    Disposition: Recommend psychiatric Inpatient admission when medically cleared. Supportive therapy provided about ongoing stressors.  JONNALAGADDA,JANARDHAHA R. 06/05/2015 10:42 AM

## 2015-06-05 NOTE — H&P (Signed)
[facility] History and Physical  EDI GORNIAK WRU:[id number] DOB: [birth year] DOA: 06/04/2015  Referring physician: Dione Booze, MD PCP: Aura Dials, MD   Chief Complaint: Tylenol overdose  HPI: [name] is a 44 y.o. male with prior history of depression presents to the ED with tylenol overdose.He states that he also took "some other pills" he thinks including BP meds and gabapentin and zanaflex nyquil zoloft quantities unknown. Patient states he took the OD around 1230 in the afternoon. Patient came into the ED around 1030PM. Patient was brought here by police. He was trying to kill himself he states because he is upset at a lot of stuff he has caused he states. Patient had initial labwork drawn which shows a tylenol level of 79 and this falls in the treatment area based on the time it was taken. He has therefore been started on N acetyl cysteine.    Review of Systems:  12 point ROS performed and is unremarkable other than HPI.   Past Medical History  Diagnosis Date  . Hypertension   . Arthritis   . Anxiety   . Depression   . 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 Back surgery      5 back surgeries  . Spinal cord stimulator implant    . Nasal sinus surgery    . Lumbar fusion Left 06/13/2013    Procedure: SACROILIAC JOINT FUSION,LEFT;  Surgeon: Reinaldo Meeker, MD;  Location: [facility];  Service: Neurosurgery;  Laterality: Left;  left  . Anterior cervical decomp/discectomy fusion N/A 12/18/2014    Procedure: ANTERIOR CERVICAL DECOMPRESSION/DISCECTOMY FUSION CERVICAL FIVE-SIX;  Surgeon: Aliene Beams, MD;  Location: [facility];  Service: Neurosurgery;  Laterality: N/A;  ANTERIOR CERVICAL DECOMPRESSION/DISCECTOMY FUSION CERVICAL FIVE-SIX   Social History:  reports that he has never smoked. He does not have any smokeless tobacco history on file. He reports that he drinks alcohol. He reports that he uses illicit  drugs.  Allergies  Allergen Reactions  . Lisinopril Anaphylaxis  . Penicillins Hives    Has patient had a PCN reaction causing immediate rash, facial/tongue/throat swelling, SOB or lightheadedness with hypotension: YesYes Has patient had a PCN reaction causing severe rash involving mucus membranes or skin necrosis: NoNo Has patient had a PCN reaction that required hospitalization NoNo Has patient had a PCN reaction occurring within the last 10 years: Linwood Dibbles If all of the above answers are "NO", then may proceed with Cephalospori    No family history on file.   Prior to Admission medications   Medication Sig Start Date End Date Taking? Authorizing Provider  acetaminophen (TYLENOL) 500 MG tablet Take 1,000 mg by mouth every 6 (six) hours as needed for mild pain.   Yes Historical Provider, MD  amLODipine (NORVASC) 10 MG tablet Take 10 mg by mouth every morning.     Yes Historical Provider, MD  amphetamine-dextroamphetamine (ADDERALL) 20 MG tablet Take 20 mg by mouth daily as needed (attention/alertness).  03/20/13  Yes Historical Provider, MD  atenolol (TENORMIN) 25 MG tablet Take 25 mg by mouth daily.     Yes Historical Provider, MD  diazepam (VALIUM) 10 MG tablet Take 10 mg by mouth 2 (two) times daily as needed for anxiety.  03/12/13  Yes Historical Provider, MD  gabapentin (NEURONTIN) 600 MG tablet Take 600 mg by mouth 3 (three) times daily.    Yes Historical Provider, MD  gemfibrozil (LOPID) 600 MG tablet Take 600 mg by  mouth daily.   Yes Historical Provider, MD  hydrochlorothiazide (HYDRODIURIL) 25 MG tablet Take 25 mg by mouth every morning.     Yes Historical Provider, MD  Oxycodone HCl 10 MG TABS Take 10 mg by mouth 3 (three) times daily as needed (Pain).   Yes Historical Provider, MD  sertraline (ZOLOFT) 100 MG tablet Take 200 mg by mouth every morning.     Yes Historical Provider, MD  tiZANidine (ZANAFLEX) 4 MG capsule Take 4 mg by mouth 3 (three) times daily as needed for muscle  spasms.  11/20/14  Yes Historical Provider, MD  Oxycodone HCl 10 MG TABS Take 1 tablet (10 mg total) by mouth every 4 (four) hours as needed for severe pain. Patient not taking: Reported on 06/04/2015 12/19/14   Aliene Beams, MD   Physical Exam: Filed Vitals:   06/04/15 2233  BP: 105/69  Pulse: 72  Temp: 97.7 F (36.5 C)  TempSrc: Oral  Resp: 14  Height:  (1.727 m)  Weight: 104.327 kg (230 lb)  SpO2: 97%    Wt Readings from Last 3 Encounters:  06/04/15 104.327 kg (230 lb)  12/18/14 105.688 kg (233 lb)  12/15/14 106.006 kg (233 lb 11.2 oz)    General:  Appears calm and comfortable Eyes: PERRL, normal lids, irises & conjunctiva ENT: grossly normal hearing, lips & tongue Neck: no LAD, masses or thyromegaly Cardiovascular: RRR, no m/r/g. No LE edema Respiratory: CTA bilaterally, no w/r/r. Normal respiratory effort. Abdomen: soft, ntnd Skin: no rash or induration seen on limited exam Musculoskeletal: grossly normal tone BUE/BLE Psychiatric: grossly normal mood and affect Neurologic: grossly non-focal.          Labs on Admission:  Basic Metabolic Panel:  Recent Labs Lab 06/04/15 2331  NA 140  K 3.3*  CL 109  CO2 22  GLUCOSE 113*  BUN 20  CREATININE 1.32*  CALCIUM 9.8  MG 2.2   Liver Function Tests:  Recent Labs Lab 06/04/15 2331  AST 23  ALT 21  ALKPHOS 104  BILITOT 0.8  PROT 8.5*  ALBUMIN 4.8   No results for input(s): LIPASE, AMYLASE in the last 168 hours. No results for input(s): AMMONIA in the last 168 hours. CBC:  Recent Labs Lab 06/04/15 2331  WBC 7.8  HGB 14.3  HCT 39.7  MCV 85.9  PLT 220   Cardiac Enzymes: No results for input(s): CKTOTAL, CKMB, CKMBINDEX, TROPONINI in the last 168 hours.  BNP (last 3 results) No results for input(s): BNP in the last 8760 hours.  ProBNP (last 3 results) No results for input(s): PROBNP in the last 8760 hours.  CBG: No results for input(s): GLUCAP in the last 168 hours.  Radiological Exams  on Admission: No results found.    Assessment/Plan Principal Problem:   Acetaminophen overdose Active Problems:   HTN (hypertension)   Depression   1. Tylenol overdose/multiple other drug ingestions -patient will be started on N-acetyl cysteine per protocol based on the nomogram -will need follow up tylenol levels 4 hours after intial -flight risk will need sitter at bedside -Psych evaluation in am -will hold gabapentin, oxycodone, zanaflex, adderall, valium, amlodipine HCTZ at least overnight  2. HTN -currently pressures are soft -will hold all antihypertensives for now  3. Depression -will need psych evaluation  4. Hypokalemia He was given Klor in ER  5, Elevated Creatinine -felt to be due to dehydration -started on IVF -will follow labs   Code Status: full code DVT Prophylaxis:heparin Family Communication: none Disposition  Plan: home    [facility] PetersburgKHAN,SAADAT A [facility] Pager [phone]

## 2015-06-06 DIAGNOSIS — I10 Essential (primary) hypertension: Secondary | ICD-10-CM

## 2015-06-06 DIAGNOSIS — F329 Major depressive disorder, single episode, unspecified: Secondary | ICD-10-CM

## 2015-06-06 DIAGNOSIS — R197 Diarrhea, unspecified: Secondary | ICD-10-CM | POA: Diagnosis present

## 2015-06-06 LAB — COMPREHENSIVE METABOLIC PANEL
ALT: 15 U/L — ABNORMAL LOW (ref 17–63)
ALT: 15 U/L — ABNORMAL LOW (ref 17–63)
AST: 13 U/L — ABNORMAL LOW (ref 15–41)
AST: 18 U/L (ref 15–41)
Albumin: 3.7 g/dL (ref 3.5–5.0)
Albumin: 3.7 g/dL (ref 3.5–5.0)
Alkaline Phosphatase: 73 U/L (ref 38–126)
Alkaline Phosphatase: 78 U/L (ref 38–126)
Anion gap: 7 (ref 5–15)
Anion gap: 8 (ref 5–15)
BUN: 11 mg/dL (ref 6–20)
BUN: 9 mg/dL (ref 6–20)
CO2: 20 mmol/L — ABNORMAL LOW (ref 22–32)
CO2: 22 mmol/L (ref 22–32)
Calcium: 8.6 mg/dL — ABNORMAL LOW (ref 8.9–10.3)
Calcium: 8.6 mg/dL — ABNORMAL LOW (ref 8.9–10.3)
Chloride: 111 mmol/L (ref 101–111)
Chloride: 111 mmol/L (ref 101–111)
Creatinine, Ser: 0.7 mg/dL (ref 0.61–1.24)
Creatinine, Ser: 0.82 mg/dL (ref 0.61–1.24)
GFR calc Af Amer: >60 mL/min (ref >60)
GFR calc Af Amer: >60 mL/min (ref >60)
GFR calc non Af Amer: >60 mL/min (ref >60)
GFR calc non Af Amer: >60 mL/min (ref >60)
Glucose, Bld: 101 mg/dL — ABNORMAL HIGH (ref 65–99)
Glucose, Bld: 91 mg/dL (ref 65–99)
Potassium: 3.2 mmol/L — ABNORMAL LOW (ref 3.5–5.1)
Potassium: 3.3 mmol/L — ABNORMAL LOW (ref 3.5–5.1)
Sodium: 139 mmol/L (ref 135–145)
Sodium: 140 mmol/L (ref 135–145)
Total Bilirubin: 0.7 mg/dL (ref 0.3–1.2)
Total Bilirubin: 0.8 mg/dL (ref 0.3–1.2)
Total Protein: 6.6 g/dL (ref 6.5–8.1)
Total Protein: 6.8 g/dL (ref 6.5–8.1)

## 2015-06-06 LAB — GLUCOSE, CAPILLARY
Glucose-Capillary: 102 mg/dL — ABNORMAL HIGH (ref 65–99)
Glucose-Capillary: 92 mg/dL (ref 65–99)
Glucose-Capillary: 58 mg/dL — ABNORMAL LOW (ref 65–99)
Glucose-Capillary: 80 mg/dL (ref 65–99)

## 2015-06-06 LAB — CBC WITH DIFFERENTIAL/PLATELET
Basophils Absolute: 0 10*3/uL (ref 0.0–0.1)
Basophils Relative: 0 %
Eosinophils Absolute: 0.1 10*3/uL (ref 0.0–0.7)
Eosinophils Relative: 1 %
HCT: 34.2 % — ABNORMAL LOW (ref 39.0–52.0)
Hemoglobin: 12.3 g/dL — ABNORMAL LOW (ref 13.0–17.0)
Lymphocytes Relative: 30 %
Lymphs Abs: 1.3 10*3/uL (ref 0.7–4.0)
MCH: 30.9 pg (ref 26.0–34.0)
MCHC: 36 g/dL (ref 30.0–36.0)
MCV: 85.9 fL (ref 78.0–100.0)
Monocytes Absolute: 0.2 10*3/uL (ref 0.1–1.0)
Monocytes Relative: 4 %
Neutro Abs: 2.8 10*3/uL (ref 1.7–7.7)
Neutrophils Relative %: 65 %
Platelets: 157 10*3/uL (ref 150–400)
RBC: 3.98 MIL/uL — ABNORMAL LOW (ref 4.22–5.81)
RDW: 14.6 % (ref 11.5–15.5)
WBC: 4.3 10*3/uL (ref 4.0–10.5)

## 2015-06-06 LAB — PROTIME-INR
INR: 1.18 (ref 0.00–1.49)
Prothrombin Time: 15.2 seconds (ref 11.6–15.2)

## 2015-06-06 LAB — ACETAMINOPHEN LEVEL: Acetaminophen (Tylenol), Serum: <10 ug/mL — ABNORMAL LOW (ref 10–30)

## 2015-06-06 LAB — MAGNESIUM: Magnesium: 1.8 mg/dL (ref 1.7–2.4)

## 2015-06-06 MED ORDER — ZOLPIDEM TARTRATE 5 MG PO TABS
5.0000 mg | ORAL_TABLET | Freq: Every evening | ORAL | Status: DC | PRN
  Administered 2015-06-06: 5 mg via ORAL
  Filled 2015-06-06: qty 1

## 2015-06-06 MED ORDER — POTASSIUM CHLORIDE CRYS ER 20 MEQ PO TBCR
40.0000 meq | EXTENDED_RELEASE_TABLET | Freq: Once | ORAL | Status: AC
  Administered 2015-06-06: 40 meq via ORAL
  Filled 2015-06-06: qty 2

## 2015-06-06 MED ORDER — IBUPROFEN 200 MG PO TABS
200.0000 mg | ORAL_TABLET | Freq: Three times a day (TID) | ORAL | Status: DC | PRN
  Administered 2015-06-06 – 2015-06-07 (×2): 200 mg via ORAL
  Filled 2015-06-06 (×2): qty 1

## 2015-06-06 NOTE — Progress Notes (Signed)
MEDICATION RELATED CONSULT NOTE - FOLLOW UP   Pharmacy Consult for Acetylcysteine Indication: acetaminophen OD  Allergies  Allergen Reactions  . Lisinopril Anaphylaxis  . Penicillins Hives    Has patient had a PCN reaction causing immediate rash, facial/tongue/throat swelling, SOB or lightheadedness with hypotension: YesYes Has patient had a PCN reaction causing severe rash involving mucus membranes or skin necrosis: YesNo Has patient had a PCN reaction that required hospitalization No Has patient had a PCN reaction occurring within the last 10 years: Yes If all of the above answers are "NO", then may proceed with Cephalospori    Patient Measurements: Height: 5\' 8"  (172.7 cm) Weight: 222 lb 7.1 oz (100.9 kg) IBW/kg (Calculated) : 68.4 Adjusted Body Weight:   Vital Signs: Temp: 99.5 F (37.5 C) (12/09 2234) Temp Source: Oral (12/09 2234) BP: 159/92 mmHg (12/09 2234) Pulse Rate: 67 (12/09 2234) Intake/Output from previous day: 12/09 0701 - 12/10 0700 In: 240 [P.O.:240] Out: -  Intake/Output from this shift:    Labs:  Recent Labs  06/04/15 2331 06/05/15 0700 06/05/15 2335  WBC 7.8 6.3  --   HGB 14.3 12.5*  --   HCT 39.7 36.0*  --   PLT 220 174  --   CREATININE 1.32* 0.98  0.96 0.82  MG 2.2  --  1.8  ALBUMIN 4.8 3.9 3.7  PROT 8.5* 7.0 6.8  AST 23 16 13*  ALT 21 18 15*  ALKPHOS 104 77 78  BILITOT 0.8 0.6 0.8   Estimated Creatinine Clearance: 129.6 mL/min (by C-G formula based on Cr of 0.82).   Microbiology: Recent Results (from the past 720 hour(s))  MRSA PCR Screening     Status: None   Collection Time: 06/05/15  2:19 PM  Result Value Ref Range Status   MRSA by PCR NEGATIVE NEGATIVE Final    Comment:        The GeneXpert MRSA Assay (FDA approved for NASAL specimens only), is one component of a comprehensive MRSA colonization surveillance program. It is not intended to diagnose MRSA infection nor to guide or monitor treatment for MRSA infections.     Medications:  Infusions:  . sodium chloride 1,000 mL (06/05/15 0157)  . acetylcysteine 15 mg/kg/hr (06/05/15 1825)    Assessment: Patient started on acetylcysteine for acetaminophen overdose.  [facility] consulted after 24hr labs returned.  Based on labs--APAP level, AST/ALT, PT/INR [facility], [facility] recommended no further therapy needed.  However, MD could finish current bag if desired.  I related this information to night coverage, and order to allow bag to finish and then stop therapy received.  Goal of Therapy:  Acetylcysteine therapy per [name].   Plan:  Will put stop time of 1200 on current order.  To make sure that another bag is not made. Remind MD that [facility] recommend the acetylcysteine could be stopped now if desired.  [name], [name] 06/06/2015,3:01 AM

## 2015-06-06 NOTE — Clinical Social Work Note (Signed)
[facility].  Pt is in process of  Being screened for CDIFF.  [facility] will continue to check pt chart to assess for results, hoping they should come in this afternoon.  Possible Bed either late this afternoon if info comes back today in time or possible tomorrow depending on results.  CSW will [facility] to [facility].  Elray Buba.Regina Kujawa, LCSW [facility] Clinical Social Worker - Weekend Coverage cell #: [phone]

## 2015-06-06 NOTE — Clinical Social Work Note (Signed)
CSW recieved consult stating pt is medically ready to discharge.  Psychiatry notes reflect pt meets inpatient criteria.  [facility].  [facility] for [facility].  Elray Buba.Regina Kujawa, LCSW [facility] Clinical Social Worker - Weekend Coverage cell #: [phone]

## 2015-06-06 NOTE — Progress Notes (Signed)
Hypoglycemic Event  CBG: 58  Treatment: PO intake - Peanut butter and crackers  Symptoms: None  Follow-up CBG: Time: 2245 CBG Result: 80  Possible Reasons for Event: Inadequate meal intake  Comments/MD notified: Will continue to monitor.    Meryl CrutchMargaret A [name]

## 2015-06-06 NOTE — Progress Notes (Signed)
[facility] Progress Note    Patient: Dean MilianJason H Dubas    WGN:[id number]:[id number]  DOB: [birth year]     DOA: 06/04/2015 Date of Service: the patient was seen and examined on 06/06/2015  Subjective: Patient complains of 4 lose watery bowel movement last night. Denies any active blood. Denies any nausea or vomiting. Complains of generalized weakness. Also complains of inability to sleep. Also complains of chronic back pain. Nutrition: Able to tolerate oral diet Activity: Ambulating in the room Last BM: 06/06/2015  Assessment and Plan: 1. Acetaminophen overdose Patient presented with Tylenol overdose due to suicidal ideation. CMP and INR remained stable. Levels are undetectable. The patient will finish his acetylcysteine today. Poison control mentions no further dosing is required.  2. Diarrhea. Check for C. difficile.  3. Back pain. Start ibuprofen.  4. Insomnia. Use Ambien as needed.  5. Polysubstance abuse. Continue CIWA protocol   DVT Prophylaxis: subcutaneous Heparin Nutrition: Regular diet Advance goals of care discussion: Full code  Brief Summary of Hospitalization:  HPI: As per the H and P dictated on admission, "Dean MilianJason H Capece is a 44 y.o. male with prior history of depression presents to the ED with tylenol overdose.He states that he also took "some other pills" he thinks including BP meds and gabapentin and zanaflex nyquil zoloft quantities unknown. Patient states he took the OD around 1230 in the afternoon. Patient came into the ED around 1030PM. Patient was brought here by police. He was trying to kill himself he states because he is upset at a lot of stuff he has caused he states. Patient had initial labwork drawn which shows a tylenol level of 79 and this falls in the treatment area based on the time it was taken. He has therefore been started on N acetyl cysteine. " Daily update: 06/06/2015 complicated course Tylenol levels are undetectable. Consultants:  Psychiatric Procedures: None Antibiotics: Anti-infectives    None      Family Communication: no family was present at bedside, at the time of interview.   Disposition:  Expected discharge date: 06/07/2015 Barriers to safe discharge: Diarrhea and resolution   Intake/Output Summary (Last 24 hours) at 06/06/15 1944 Last data filed at 06/06/15 0600  Gross per 24 hour  Intake   3000 ml  Output      0 ml  Net   3000 ml   Filed Weights   06/04/15 2233 06/05/15 0600  Weight: 104.327 kg (230 lb) 100.9 kg (222 lb 7.1 oz)    Objective: Physical Exam: Filed Vitals:   06/05/15 1654 06/05/15 2234 06/06/15 0509 06/06/15 1536  BP: 147/85 159/92 135/81 138/78  Pulse: 73 67 70 68  Temp: 98 F (36.7 C) 99.5 F (37.5 C) 97.9 F (36.6 C) 98.2 F (36.8 C)  TempSrc: Oral Oral Oral Oral  Resp: 18 18 16 18   Height:      Weight:      SpO2: 98% 99% 99% 100%     General: Appear in mild distress, no Rash; Oral Mucosa moist. Cardiovascular: S1 and S2 Present, no Murmur, no JVD Respiratory: Bilateral Air entry present and Clear to Auscultation, no Crackles, no wheezes Abdomen: Bowel Sound present, Soft and no tenderness Extremities: no Pedal edema, no calf tenderness Neurology: Grossly no focal neuro deficit.  Data Reviewed: CBC:  Recent Labs Lab 06/04/15 2331 06/05/15 0700 06/06/15 0845  WBC 7.8 6.3 4.3  NEUTROABS  --   --  2.8  HGB 14.3 12.5* 12.3*  HCT 39.7 36.0* 34.2*  MCV 85.9  86.7 85.9  PLT 220 174 157   Basic Metabolic Panel:  Recent Labs Lab 06/04/15 2331 06/05/15 0700 06/05/15 2335 06/06/15 0845  NA 140 142 140 139  K 3.3* 3.2* 3.2* 3.3*  CL 109 112* 111 111  CO2 22 19* 22 20*  GLUCOSE 113* 110* 91 101*  BUN CREATININE 1.32* 0.98  0.96 0.82 0.70  CALCIUM 9.8 8.6* 8.6* 8.6*  MG 2.2  --  1.8  --    Liver Function Tests:  Recent Labs Lab 06/04/15 2331 06/05/15 0700 06/05/15 2335 06/06/15 0845  AST 23 16 13* 18  ALT 21 18 15* 15*   ALKPHOS 104 77 78 73  BILITOT 0.8 0.6 0.8 0.7  PROT 8.5* 7.0 6.8 6.6  ALBUMIN 4.8 3.9 3.7 3.7   No results for input(s): LIPASE, AMYLASE in the last 168 hours. No results for input(s): AMMONIA in the last 168 hours.  Cardiac Enzymes: No results for input(s): CKTOTAL, CKMB, CKMBINDEX, TROPONINI in the last 168 hours. BNP (last 3 results) No results for input(s): BNP in the last 8760 hours.  ProBNP (last 3 results) No results for input(s): PROBNP in the last 8760 hours.   CBG:  Recent Labs Lab 06/05/15 0804 06/05/15 2321 06/06/15 0825 06/06/15 1642  GLUCAP 103* 84 102* 92    Recent Results (from the past 240 hour(s))  MRSA PCR Screening     Status: None   Collection Time: 06/05/15  2:19 PM  Result Value Ref Range Status   MRSA by PCR NEGATIVE NEGATIVE Final    Comment:        The GeneXpert MRSA Assay (FDA approved for NASAL specimens only), is one component of a comprehensive MRSA colonization surveillance program. It is not intended to diagnose MRSA infection nor to guide or monitor treatment for MRSA infections.      Studies: No results found.   Scheduled Meds: . folic acid  1 mg Oral Daily  . heparin  5,000 Units Subcutaneous 3 times per day  . Influenza vac split quadrivalent PF  0.5 mL Intramuscular Tomorrow-1000  . LORazepam  0-4 mg Oral Q6H   Followed by  . [START ON 06/07/2015] LORazepam  0-4 mg Oral Q12H  . multivitamin with minerals  1 tablet Oral Daily  . pneumococcal 23 valent vaccine  0.5 mL Intramuscular Tomorrow-1000  . thiamine  100 mg Oral Daily   Continuous Infusions:  PRN Meds: ibuprofen, LORazepam **OR** LORazepam, zolpidem  Time spent: 30 minutes  Author: Lynden Oxford, MD [facility] Pager: [phone] 06/06/2015 7:44 PM  If 7PM-7AM, please contact night-coverage at [email or web address], password [facility]

## 2015-06-06 NOTE — Progress Notes (Signed)
Assessment: Chaplain visited with [name] during morning rounds as the result of a Spiritual Care consult request. Mr [name] was very open about his addictions, his spiritual pain and his past low esteem. He took ownership for his addictive behaviors and his attempted suicide. He indicates that he was at his lowest point after straining every productive relationship he has had. He indicates that his wife is very supportive of his efforts to recover, is staying with him, and is not an Scientist, forensicenabler. She pours alcoholic beverages out when she finds them and disposes of any drugs she finds. He has found a supportive community of faith with a pastor who is accepting and works hard to assist him to his goal of abstaining from both alcohol and recreational drugs. He states his drug of choice is "MORE." With both addictions he acknowledges that "one is too many and on thousand is not enough" to which he translates he can't stop at one. In taking responsibility for his actions, he admits a large part of his stress comes from low self-esteem. He does not like the person he has become.  His current stressers are his relationship with his wife, whom he fears may grow tired of his "falling off the wagon." His shame of having gone into rehab and starting to recover from the bad effects of his addictions, only to be back to where he was at before rehab and past behavioral health treatments. A major stresser is his work related and accident related back problems. He knows that he can't get pain relief without drugs that sends him back to his drug addiction.  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 Mr [name] needs the opportunity to continue to talk through his spiritual  pains, and have intensive spiritual care and interventions. He has found that meditation helps him to reduce his hunger for alcohol and drugs. Chaplain assistance in finding meditative methods that meets his individual needs will be beneficial. Chaplains, nurses and physicians best assist in the relief of his spiritual pain by holding him to telling the truth and providing non-condemning support.

## 2015-06-07 ENCOUNTER — Inpatient Hospital Stay (HOSPITAL_COMMUNITY)
Admission: AD | Admit: 2015-06-07 | Discharge: 2015-06-11 | DRG: 885 | Disposition: A | Discharge status: Home / Self Care | Payer: 59 | Source: Intra-hospital | Attending: Psychiatry | Admitting: Psychiatry

## 2015-06-07 ENCOUNTER — Encounter (HOSPITAL_COMMUNITY): Payer: Self-pay

## 2015-06-07 DIAGNOSIS — F1120 Opioid dependence, uncomplicated: Secondary | ICD-10-CM | POA: Diagnosis present

## 2015-06-07 DIAGNOSIS — I10 Essential (primary) hypertension: Secondary | ICD-10-CM | POA: Diagnosis present

## 2015-06-07 DIAGNOSIS — F1920 Other psychoactive substance dependence, uncomplicated: Secondary | ICD-10-CM

## 2015-06-07 DIAGNOSIS — F1020 Alcohol dependence, uncomplicated: Secondary | ICD-10-CM | POA: Diagnosis present

## 2015-06-07 DIAGNOSIS — Z23 Encounter for immunization: Secondary | ICD-10-CM

## 2015-06-07 DIAGNOSIS — G4700 Insomnia, unspecified: Secondary | ICD-10-CM | POA: Diagnosis present

## 2015-06-07 DIAGNOSIS — F329 Major depressive disorder, single episode, unspecified: Secondary | ICD-10-CM | POA: Diagnosis not present

## 2015-06-07 DIAGNOSIS — T391X2A Poisoning by 4-Aminophenol derivatives, intentional self-harm, initial encounter: Secondary | ICD-10-CM | POA: Diagnosis not present

## 2015-06-07 DIAGNOSIS — M1990 Unspecified osteoarthritis, unspecified site: Secondary | ICD-10-CM | POA: Diagnosis present

## 2015-06-07 DIAGNOSIS — F1010 Alcohol abuse, uncomplicated: Secondary | ICD-10-CM | POA: Diagnosis not present

## 2015-06-07 DIAGNOSIS — F411 Generalized anxiety disorder: Secondary | ICD-10-CM | POA: Diagnosis present

## 2015-06-07 DIAGNOSIS — F332 Major depressive disorder, recurrent severe without psychotic features: Secondary | ICD-10-CM | POA: Diagnosis present

## 2015-06-07 DIAGNOSIS — F1410 Cocaine abuse, uncomplicated: Secondary | ICD-10-CM | POA: Diagnosis not present

## 2015-06-07 DIAGNOSIS — T6592XA Toxic effect of unspecified substance, intentional self-harm, initial encounter: Secondary | ICD-10-CM | POA: Diagnosis present

## 2015-06-07 LAB — HEPATIC FUNCTION PANEL
ALT: 23 U/L (ref 17–63)
AST: 26 U/L (ref 15–41)
Albumin: 4.1 g/dL (ref 3.5–5.0)
Alkaline Phosphatase: 74 U/L (ref 38–126)
Bilirubin, Direct: <0.1 mg/dL — ABNORMAL LOW (ref 0.1–0.5)
Total Bilirubin: 0.7 mg/dL (ref 0.3–1.2)
Total Protein: 7.1 g/dL (ref 6.5–8.1)

## 2015-06-07 LAB — GLUCOSE, CAPILLARY: Glucose-Capillary: 87 mg/dL (ref 65–99)

## 2015-06-07 LAB — C DIFFICILE QUICK SCREEN W PCR REFLEX
C Diff antigen: NEGATIVE
C Diff interpretation: NEGATIVE
C Diff toxin: NEGATIVE

## 2015-06-07 MED ORDER — ALUM & MAG HYDROXIDE-SIMETH 200-200-20 MG/5ML PO SUSP: 30.0000 mL | ORAL | Status: DC | PRN

## 2015-06-07 MED ORDER — ACETAMINOPHEN 325 MG PO TABS: 650.0000 mg | ORAL_TABLET | Freq: Four times a day (QID) | ORAL | Status: DC | PRN

## 2015-06-07 MED ORDER — FOLIC ACID 1 MG PO TABS
1.0000 mg | ORAL_TABLET | Freq: Every day | ORAL | Status: DC
  Administered 2015-06-07 – 2015-06-11 (×5): 1 mg via ORAL
  Filled 2015-06-07 (×8): qty 1

## 2015-06-07 MED ORDER — ZOLPIDEM TARTRATE 5 MG PO TABS: 5.0000 mg | ORAL_TABLET | Freq: Every evening | ORAL | Status: DC | PRN

## 2015-06-07 MED ORDER — GEMFIBROZIL 600 MG PO TABS
600.0000 mg | ORAL_TABLET | Freq: Every day | ORAL | Status: DC
  Administered 2015-06-07 – 2015-06-11 (×5): 600 mg via ORAL
  Filled 2015-06-07 (×8): qty 1

## 2015-06-07 MED ORDER — AMLODIPINE BESYLATE 10 MG PO TABS
10.0000 mg | ORAL_TABLET | ORAL | Status: DC
  Administered 2015-06-08 – 2015-06-11 (×4): 10 mg via ORAL
  Filled 2015-06-07 (×6): qty 1

## 2015-06-07 MED ORDER — INFLUENZA VAC SPLIT QUAD 0.5 ML IM SUSY
0.5000 mL | PREFILLED_SYRINGE | INTRAMUSCULAR | Status: AC
  Administered 2015-06-08: 0.5 mL via INTRAMUSCULAR
  Filled 2015-06-07: qty 0.5

## 2015-06-07 MED ORDER — LOPERAMIDE HCL 2 MG PO CAPS: 2.0000 mg | ORAL_CAPSULE | ORAL | Status: DC | PRN

## 2015-06-07 MED ORDER — FOLIC ACID 1 MG PO TABS: 1.0000 mg | ORAL_TABLET | Freq: Every day | ORAL | Status: DC

## 2015-06-07 MED ORDER — GABAPENTIN 300 MG PO CAPS
600.0000 mg | ORAL_CAPSULE | Freq: Three times a day (TID) | ORAL | Status: DC
Start: 2015-06-07 — End: 2015-06-11
  Administered 2015-06-07 – 2015-06-11 (×12): 600 mg via ORAL
  Filled 2015-06-07 (×20): qty 2

## 2015-06-07 MED ORDER — NICOTINE 21 MG/24HR TD PT24
21.0000 mg | MEDICATED_PATCH | Freq: Every day | TRANSDERMAL | Status: DC
  Filled 2015-06-07 (×2): qty 1

## 2015-06-07 MED ORDER — QUETIAPINE FUMARATE 100 MG PO TABS
100.0000 mg | ORAL_TABLET | Freq: Every day | ORAL | Status: DC
  Administered 2015-06-07 – 2015-06-09 (×3): 100 mg via ORAL
  Filled 2015-06-07 (×5): qty 1

## 2015-06-07 MED ORDER — TRAZODONE HCL 50 MG PO TABS
50.0000 mg | ORAL_TABLET | Freq: Every day | ORAL | Status: DC
  Filled 2015-06-07: qty 1

## 2015-06-07 MED ORDER — HYDROXYZINE HCL 25 MG PO TABS
25.0000 mg | ORAL_TABLET | ORAL | Status: DC | PRN
  Administered 2015-06-08 – 2015-06-09 (×5): 25 mg via ORAL
  Filled 2015-06-07 (×5): qty 1

## 2015-06-07 MED ORDER — VITAMIN B-1 100 MG PO TABS
100.0000 mg | ORAL_TABLET | Freq: Every day | ORAL | Status: DC
  Administered 2015-06-07 – 2015-06-11 (×5): 100 mg via ORAL
  Filled 2015-06-07 (×8): qty 1

## 2015-06-07 MED ORDER — THIAMINE HCL 100 MG PO TABS: 100.0000 mg | ORAL_TABLET | Freq: Every day | ORAL | Status: DC

## 2015-06-07 MED ORDER — IBUPROFEN 600 MG PO TABS
600.0000 mg | ORAL_TABLET | Freq: Four times a day (QID) | ORAL | Status: DC | PRN
  Administered 2015-06-07 – 2015-06-10 (×6): 600 mg via ORAL
  Filled 2015-06-07 (×6): qty 1

## 2015-06-07 MED ORDER — MAGNESIUM HYDROXIDE 400 MG/5ML PO SUSP: 30.0000 mL | Freq: Every day | ORAL | Status: DC | PRN

## 2015-06-07 NOTE — Progress Notes (Signed)
Patient did not attend the evening speaker AA meeting. Pt was notified that group was beginning but remained in bed.   

## 2015-06-07 NOTE — Progress Notes (Signed)
Writer [name] Pt to be [facility] soon.

## 2015-06-07 NOTE — Progress Notes (Signed)
Admission note:  "I wanted to commit suicide, not a good attempt or I wouldn't be here." Pt presents with depressed affect. Pt reports "I took a whole bunch of blood pressure pills and Tylenol." Pt reports this was a suicide attempt.  Pt is actively suicidal, unable to verbally contract "I'm getting worse and worse can't get the voices to stop telling me to do it." "If I wanted to hurt myself here I would." Pt unable to contract for safety. Pt reports auditory hallucinations. Denies visual hallucinations. Denies HI. Pt reports a history of substance abuse including cocaine, THC, and crystal meth. Pt also reports alcohol abuse. Pt reports he has received disability since 2005 for back injury. Pt placed as high fall risk d/t pt reports a fall 2 weeks ago. Pt reports he took a whole bunch of muscle relaxer as a suicide attempt and fell. Pt reports he currently takes Zoloft as prescribed and has been on it for 10 years. Pt reports he currently lives with his wife and identifies her as positive support system. Pt denies assess to firearms. Pt cooperative during the admission process, depressed affect. Signed all admission paperwork. Skin assessment performed. Self inflicted superficial cuts noted to pt left forearm. All personal items locked into locker #55. 1:1 iniated d/t pt unable to contract for safety and actively suicidal. Assigned MHT present. Will continue to monitor.

## 2015-06-07 NOTE — Progress Notes (Signed)
Patient 1:1 Note D: Patient on the phone at this time.  Patient appears calm and cooperative.  Patient still cannot contract for safety and needs 1:1 observation.  Patient actively SI, denies HI and denies AVH. A: Staff to monitor Q 15 mins for safety.  Encouragement and support offered.   R: Patient remains safe on the unit.  Patient patient did not attend group tonight.

## 2015-06-07 NOTE — Discharge Summary (Signed)
[facility] Discharge Summary   Patient: [name]    ZOX:[id number] PCP: Aura Dials, MD    DOB: 1971/05/07 Date of admission: 06/04/2015  Date of discharge: 06/07/2015   Discharge Diagnoses:  Principal Problem:   Acetaminophen overdose Active Problems:   HTN (hypertension)   Depression   Cocaine abuse   Alcohol abuse   Suicidal ideation   Diarrhea   Recommendations for Outpatient Follow-up:  1.  Follow up with PCP in one week after discharge 2. Discharging to behavioral health  3. No narcotic or psychotropic medications are given to due to polysubstance overdose, psychiatry will adjust medications.  Diet recommendation: Regular diet  Activity: The patient is advised to gradually reintroduce usual activities.  Discharge Condition: good  History of present illness: As per the H and P dictated on admission, "[name] is a 44 y.o. male with prior history of depression presents to the ED with tylenol overdose.He states that he also took "some other pills" he thinks including BP meds and gabapentin and zanaflex nyquil zoloft quantities unknown. Patient states he took the OD around 1230 in the afternoon. Patient came into the ED around 1030PM. Patient was brought here by police. He was trying to kill himself he states because he is upset at a lot of stuff he has caused he states. Patient had initial labwork drawn which shows a tylenol level of 79 and this falls in the treatment area based on the time it was taken. He has therefore been started on N acetyl cysteine. "  Hospital Course:   Summary of his active problems in the hospital is as following. 1. Acetaminophen overdose Patient presented with Tylenol overdose due to suicidal ideation. Liver function and INR remained stable. Acetaminophen Levels are undetectable. The patient finish his acetylcysteine 06/06/2015 per poison control.  2. Diarrhea. Resolved C. difficile is negative start Imodium recommended oral  hydration.  3. Back pain. Can use over-the-counter ibuprofen. Continue gabapentin and Zanaflex  5. Polysubstance abuse. CIWA score remains less than 5. Discharging on oral Valium, patient will need outpatient substance abuse rehabilitation Continue folic acid and thiamin  6. Essential hypertension Stopping atenolol due to cocaine abuse. Resume amlodipine blood pressure remained stable in the hospital.  All other chronic medical condition were stable during the hospitalization.  Patient was ambulatory without any assistance. On the day of the discharge the patient's diarrhea resolved, and no other acute medical condition were reported by patient. the patient was felt safe to be discharge at behavioral health.  Procedures and Results:  none   Consultations:  Psychiatry  Discharge Exam: Filed Weights   06/04/15 2233 06/05/15 0600  Weight: 104.327 kg (230 lb) 100.9 kg (222 lb 7.1 oz)   Filed Vitals:   06/07/15 0003 06/07/15 0500  BP: 134/65 138/83  Pulse: 70 68  Temp:  98.3 F (36.8 C)  Resp:  20    General: Appear in no distress, no Rash; Oral Mucosa moist. Cardiovascular: S1 and S2 Present, no Murmur, no JVD Respiratory: Bilateral Air entry present and Clear to Auscultation, jno Crackles, nowheezes Abdomen: Bowel Sound present, Soft and no tenderness Extremities: no Pedal edema, no calf tenderness Neurology: Grossly no focal neuro deficit.  DISCHARGE MEDICATION: Discharge Instructions    Diet general    Complete by:  As directed      Increase activity slowly    Complete by:  As directed           Current Discharge Medication List  START taking these medications   Details  folic acid (FOLVITE) 1 MG tablet Take 1 tablet (1 mg total) by mouth daily. Qty: 30 tablet, Refills: 0    loperamide (IMODIUM) 2 MG capsule Take 1 capsule (2 mg total) by mouth as needed for diarrhea or loose stools. Qty: 30 capsule, Refills: 0    thiamine 100 MG tablet Take 1  tablet (100 mg total) by mouth daily. Qty: 30 tablet, Refills: 0      CONTINUE these medications which have NOT CHANGED   Details  amLODipine (NORVASC) 10 MG tablet Take 10 mg by mouth every morning.      diazepam (VALIUM) 10 MG tablet Take 10 mg by mouth 2 (two) times daily as needed for anxiety.     gabapentin (NEURONTIN) 600 MG tablet Take 600 mg by mouth 3 (three) times daily.     gemfibrozil (LOPID) 600 MG tablet Take 600 mg by mouth daily.    tiZANidine (ZANAFLEX) 4 MG capsule Take 4 mg by mouth 3 (three) times daily as needed for muscle spasms.  Refills: 4      STOP taking these medications     acetaminophen (TYLENOL) 500 MG tablet      amphetamine-dextroamphetamine (ADDERALL) 20 MG tablet      atenolol (TENORMIN) 25 MG tablet      hydrochlorothiazide (HYDRODIURIL) 25 MG tablet      Oxycodone HCl 10 MG TABS      sertraline (ZOLOFT) 100 MG tablet        Allergies  Lisinopril Anaphylaxis High Allergy 03/17/2011  Penicillins Hives Low Allergy 03/16/2011  The results of significant diagnostics from this hospitalization (including imaging, microbiology, ancillary and laboratory) are listed below for reference.    Significant Diagnostic Studies: No results found.  Microbiology: Recent Results (from the past 240 hour(s))  MRSA PCR Screening     Status: None   Collection Time: 06/05/15  2:19 PM  Result Value Ref Range Status   MRSA by PCR NEGATIVE NEGATIVE Final    Comment:        The GeneXpert MRSA Assay (FDA approved for NASAL specimens only), is one component of a comprehensive MRSA colonization surveillance program. It is not intended to diagnose MRSA infection nor to guide or monitor treatment for MRSA infections.   C difficile quick scan w PCR reflex     Status: None   Collection Time: 06/06/15 12:41 PM  Result Value Ref Range Status   C Diff antigen NEGATIVE NEGATIVE Final   C Diff toxin NEGATIVE NEGATIVE Final   C Diff interpretation Negative for  toxigenic C. difficile  Corrected    Comment: Metro Kung RN [id number] 06/07/15 A NAVARRO CORRECTED ON 12/11 AT 0425: PREVIOUSLY REPORTED AS NEGATIVE      Labs: CBC:  Recent Labs Lab 06/04/15 2331 06/05/15 0700 06/06/15 0845  WBC 7.8 6.3 4.3  NEUTROABS  --   --  2.8  HGB 14.3 12.5* 12.3*  HCT 39.7 36.0* 34.2*  MCV 85.9 86.7 85.9  PLT 220 174 157   Basic Metabolic Panel:  Recent Labs Lab 06/04/15 2331 06/05/15 0700 06/05/15 2335 06/06/15 0845  NA 140 142 140 139  K 3.3* 3.2* 3.2* 3.3*  CL 109 112* 111 111  CO2 22 19* 22 20*  GLUCOSE 113* 110* 91 101*  BUN CREATININE 1.32* 0.98  0.96 0.82 0.70  CALCIUM 9.8 8.6* 8.6* 8.6*  MG 2.2  --  1.8  --  Liver Function Tests:  Recent Labs Lab 06/04/15 2331 06/05/15 0700 06/05/15 2335 06/06/15 0845 06/07/15 0546  AST 23 16 13* 18 26  ALT 21 18 15* 15* 23  ALKPHOS 104 77 78 73 74  BILITOT 0.8 0.6 0.8 0.7 0.7  PROT 8.5* 7.0 6.8 6.6 7.1  ALBUMIN 4.8 3.9 3.7 3.7 4.1   No results for input(s): LIPASE, AMYLASE in the last 168 hours. No results for input(s): AMMONIA in the last 168 hours.  Cardiac Enzymes: No results for input(s): CKTOTAL, CKMB, CKMBINDEX, TROPONINI in the last 168 hours. BNP (last 3 results) No results for input(s): BNP in the last 8760 hours.  ProBNP (last 3 results) No results for input(s): PROBNP in the last 8760 hours.  CBG:  Recent Labs Lab 06/06/15 0825 06/06/15 1642 06/06/15 2205 06/06/15 2248 06/07/15 0820  GLUCAP 102* 92 58* 80 87    Time spent: 30 minutes  Signed:  PATEL, PRANAV  [facility] 06/07/2015, 11:08 AM

## 2015-06-07 NOTE — Clinical Social Work Note (Addendum)
CSW spoke with RN and confirmed that pt does not have C Diff.  [facility] to see if [facility] have a bed for pt today.  [facility] will have discharges and will follow up with CSW this afternoon for [facility]  .Elray Bubaegina Kujawa, LCSW [facility] Clinical Social Worker - Weekend Coverage cell #: [phone]

## 2015-06-07 NOTE — Progress Notes (Signed)
D: Patient in bed on first approach.  Patient complains of racing thoughts, and depression.  Patient does appear flat and depressed.  Patient actively suicidal but and cannot verbally contract for safety. Patient did not voice a plan. Patient is on 1:1 because of this.  Patient denies HI and denies AVH but then states he is unsure.  Patient states his goal is to feel better about himself.  Patient has not achieved his goal yet.   A: Staff to monitor Q 15 mins for safety.  Encouragement and support offered.  Scheduled medications administered per orders.  Patient remains on 1:1 for safety R: Patient remains safe on the unit.  Patient did not attend group tonight.  Patient visible on the unit tonight to use the phone and medications.  Patient taking administered medications.

## 2015-06-07 NOTE — Progress Notes (Signed)
Writer called [facility] to give report to receiving nurse who is currently at lunch. Will call back in 30 minutes.

## 2015-06-07 NOTE — Progress Notes (Signed)
[place] [facility]. Alert[facility]. Pt alerted spouse via phone.

## 2015-06-07 NOTE — Tx Team (Signed)
Initial Interdisciplinary Treatment Plan   PATIENT STRESSORS: Substance abuse   PATIENT STRENGTHS: Ability for insight Average or above average intelligence General fund of knowledge   PROBLEM LIST: Problem List/Patient Goals Date to be addressed Date deferred Reason deferred Estimated date of resolution  "I wanted to commit suicide, not a good attempt or I wouldn't be here." 06/07/2015     "I like to get my braun a little more straight." 06/07/2015     "I would love to be some what pain free." 06/07/2015     Substance Abuse 06/07/2015     Actively suicidal 06/07/2015                              DISCHARGE CRITERIA:  Ability to meet basic life and health needs Improved stabilization in mood, thinking, and/or behavior  PRELIMINARY DISCHARGE PLAN: Return to previous living arrangement  PATIENT/FAMIILY INVOLVEMENT: This treatment plan has been presented to and reviewed with the patient, [name], [name] 06/07/2015, 5:53 PM

## 2015-06-07 NOTE — Clinical Social Work Placement (Signed)
   CLINICAL SOCIAL WORK PLACEMENT  NOTE  Date:  06/07/2015  Patient Details  Name: [name] MRN: [id number] Date of Birth: October 23, 1970  Clinical Social Work is seeking post-discharge placement for this patient at the   level of care (*CSW will initial, date and re-position this form in  chart as items are completed):      Patient/family provided with [facility] Clinical Social Work Department's list of facilities offering this level of care within the geographic area requested by the patient (or if unable, by the patient's family).      Patient/family informed of their freedom to choose among providers that offer the needed level of care, that participate in Medicare, Medicaid or managed care program needed by the patient, have an available bed and are willing to accept the patient.      Patient/family informed of [facility]'s ownership interest in [facility] and [facility], as well as of the fact that they are under no obligation to receive care at these facilities.  PASRR submitted to EDS on       PASRR number received on       Existing PASRR number confirmed on       FL2 transmitted to all facilities in geographic area requested by pt/family on       FL2 transmitted to all facilities within larger geographic area on       Patient informed that his/her managed care company has contracts with or will negotiate with certain facilities, including the following:            Patient/family informed of bed offers received.  [facility]    Physician recommends and patient chooses bed at      [facility]  [facility]    Patient family notified on   June 06, 2105 of transfer.  Name of family member notified:     Wife paula   PHYSICIAN       Additional Comment:    _______________________________________________ Annetta MawKujawa,Regina G, LCSW 06/07/2015, 3:00 PM

## 2015-06-07 NOTE — Progress Notes (Signed)
1:1 initiation note:   During admission process pt reports he is actively suicidal. "If I wanted to hurt myself here I would." Pt unable to contract for safety at this time. "I'm getting worse and worse, I can't get the voices to stop telling me to do it." Pt has history of two previous suicide attempts. Presents with depressed affect. Pt placed on 1:1 per MD for suicide precautions. Assigned staff member present, will continue to monitor.

## 2015-06-07 NOTE — Clinical Social Work Note (Signed)
CSW met with pt to have him sign intake paperwork for [facility].  CSW called [employer] for pt transport to [facility]  .Dede Query, LCSW [facility] Clinical Social Worker - Weekend Coverage cell #: [phone]

## 2015-06-08 ENCOUNTER — Encounter (HOSPITAL_COMMUNITY): Payer: Self-pay | Admitting: Psychiatry

## 2015-06-08 DIAGNOSIS — F332 Major depressive disorder, recurrent severe without psychotic features: Secondary | ICD-10-CM | POA: Diagnosis present

## 2015-06-08 DIAGNOSIS — F1920 Other psychoactive substance dependence, uncomplicated: Secondary | ICD-10-CM

## 2015-06-08 DIAGNOSIS — T391X2A Poisoning by 4-Aminophenol derivatives, intentional self-harm, initial encounter: Secondary | ICD-10-CM

## 2015-06-08 DIAGNOSIS — F1120 Opioid dependence, uncomplicated: Secondary | ICD-10-CM | POA: Diagnosis present

## 2015-06-08 DIAGNOSIS — T1491 Suicide attempt: Secondary | ICD-10-CM

## 2015-06-08 MED ORDER — DULOXETINE HCL 30 MG PO CPEP
30.0000 mg | ORAL_CAPSULE | Freq: Every day | ORAL | Status: DC
  Administered 2015-06-08 – 2015-06-11 (×4): 30 mg via ORAL
  Filled 2015-06-08 (×7): qty 1

## 2015-06-08 NOTE — Progress Notes (Signed)
[facility] Post 1:1 Observation Documentation  For the first (8) hours following discontinuation of 1:1 precautions, a progress note entry by nursing staff should be documented at least every 2 hours, reflecting the patient's behavior, condition, mood, and conversation.  Use the progress notes for additional entries.  Time 1:1 discontinued:  1030  Patient's Behavior:  Patient is calm at this time talking on the phone in the hallway.   Patient's Condition:  No distress noted, respirations are even and unlabored.   Patient's Conversation:  No conversation heard at this time as patient is talking low.   Marzetta BoardDopson, Christa E 06/08/2015, 6:31 PM

## 2015-06-08 NOTE — Progress Notes (Signed)
[facility] Post 1:1 Observation Documentation  For the first (8) hours following discontinuation of 1:1 precautions, a progress note entry by nursing staff should be documented at least every 2 hours, reflecting the patient's behavior, condition, mood, and conversation.  Use the progress notes for additional entries.  Time 1:1 discontinued:  1030  Patient's Behavior:  Patient laying in bed sleeping at this time.   Patient's Condition:  No distress noted, respirations are even and unlabored.   Patient's Conversation:  No conversation at this time.   Marzetta BoardDopson, Christa E 06/08/2015, 2:46 PM

## 2015-06-08 NOTE — [facility] Group Notes (Signed)
[facility] LCS[facility] After[facility] Discharge Planning Group Note   06/08/2015 10:54 AM  Participation Quality:  Invited. DID NOT ATTEND. Pt chose to remain in bed.   Smart, Heather LCSW

## 2015-06-08 NOTE — Plan of Care (Signed)
Problem: Diagnosis: Increased Risk For Suicide Attempt Goal: LTG-Patient Will Report Improved Mood and Deny Suicidal LTG (by discharge) Patient will report improved mood and deny suicidal ideation.  Outcome: Not Progressing Patient still remains depressed and states he is actively SI and cannot contract of safety.  Patient remains on 1:1.

## 2015-06-08 NOTE — Progress Notes (Signed)
[facility] Post 1:1 Observation Documentation  For the first (8) hours following discontinuation of 1:1 precautions, a progress note entry by nursing staff should be documented at least every 2 hours, reflecting the patient's behavior, condition, mood, and conversation.  Use the progress notes for additional entries.  Time 1:1 discontinued:  1030  Patient's Behavior:  Patient is calm at this time laying in bed with his eyes open.   Patient's Condition:  No distress noted. Respirations are even and unlabored.  Patient's Conversation:  "Okay" in response to Clinical research associatewriter stating his medications are ready for him at the medication window.  Marzetta BoardDopson, Christa E 06/08/2015, 5:03 PM

## 2015-06-08 NOTE — Progress Notes (Signed)
Adult Psychoeducational Group Note  Date:  06/08/2015 Time:  9:44 PM  Group Topic/Focus:  Wrap-Up Group:   The focus of this group is to help patients review their daily goal of treatment and discuss progress on daily workbooks.  Participation Level:  Active  Participation Quality:  Appropriate  Affect:  Appropriate  Cognitive:  Appropriate  Insight: Good  Engagement in Group:  Engaged  Modes of Intervention:  Activity  Additional Comments:  Patient rated his day a 4.5. Said his goal was to get on proper medications and to get ready for discharge.  Natasha MeadKiara M McCain 06/08/2015, 9:44 PM

## 2015-06-08 NOTE — Progress Notes (Signed)
D: Patient resting in bed with eyes closed.  Respirations even and unlabored.  Patient appears to be in no apparent distress. A: Staff to monitor Q 15 mins for safety.  Patient remains on 1:1 for safety. R:  Patient remains safe on the unit.  

## 2015-06-08 NOTE — Plan of Care (Signed)
Problem: Diagnosis: Increased Risk For Suicide Attempt Goal: STG-Patient Will Comply With Medication Regime Outcome: Progressing [name] is compliant with medication regimen at this time.

## 2015-06-08 NOTE — Progress Notes (Signed)
D: Patient in the hallway on approach.  Patient states he is feeling much better today.  Patient sarres his goal for today was to feel more hopeful.  Patient states he did meet his goal.  Patient also states he is off his 1:1 and is not able to contract for safety.  Patient denies SI/HI and AVH tonight.  Patient statets he was able to talk to his wife and he states they had a good conversation. A: Staff to monitor Q 15 mins for safety.  Encouragement and support offered.  Scheduled medications administered per orders.  R: Patient remains safe on the unit.  Patient attended group tonight.  Patient visible on the unit.  Patient taking administered medications.
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
## 2015-06-08 NOTE — B[facility] Counselor (Signed)
CSW attempted to meet with pt twice today for psychosocial assessment and to discuss treatment plan/aftercare. Pt continues to sleep in bed. Not willing to speak with CSW today.   Trula SladeHeather Smart, MSW, LCSW Clinical Social Worker 06/08/2015 3:06 PM
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
## 2015-06-08 NOTE — [facility] Group Notes (Signed)
B[facility] LCSW Group Therapy  06/08/2015 11:33 AM  Type of Therapy:  Group Therapy  Participation Level:  Did Not Attend-pt invited. Chose to remain in bed.   Modes of Intervention:  Confrontation, Discussion, Education, Problem-solving, Rapport Building, Socialization and Support  Summary of Progress/Problems: Today's Topic: Overcoming Obstacles. Patients identified one short term goal and potential obstacles in reaching this goal. Patients processed barriers involved in overcoming these obstacles. Patients identified steps necessary for overcoming these obstacles and explored motivation (internal and external) for facing these difficulties head on.    Smart, Heather LCSW 06/08/2015, 11:33 AM

## 2015-06-08 NOTE — Progress Notes (Signed)
B[facility] Post 1:1 Observation Documentation  For the first (8) hours following discontinuation of 1:1 precautions, a progress note entry by nursing staff should be documented at least every 2 hours, reflecting the patient's behavior, condition, mood, and conversation.  Use the progress notes for additional entries.  Time 1:1 discontinued:  1030  Patient's Behavior:  Patient is laying in bed at this time.   Patient's Condition:  No distress noted, respirations are even and unlabored.   Patient's Conversation:  None at this time.   Marzetta BoardDopson, Christa E 06/08/2015, 10:31 AM

## 2015-06-08 NOTE — Tx Team (Signed)
Interdisciplinary Treatment Plan Update (Adult)  Date:  06/08/2015  Time Reviewed:  8:19 AM   Progress in Treatment: Attending groups: No. New to unit. Continuing to assess.  Participating in groups:  No. Taking medication as prescribed:  Yes. Tolerating medication:  Yes. Family/Significant othe contact made:  SPE required for this pt.  Patient understands diagnosis:  Yes. and As evidenced by:  seeking treatment for SI, depression, alcohol/cocaine abuse, and for medication stabilization. Discussing patient identified problems/goals with staff:  Yes. Medical problems stabilized or resolved:  Yes. Denies suicidal/homicidal ideation: No. Pt endorsing passive SI/pt states that he is now able to contract for safety on the unit.  Issues/concerns per patient self-inventory:  Other:  Discharge Plan or Barriers: CSW assessing for appropriate referrals.   Reason for Continuation of Hospitalization: Depression Medication stabilization Suicidal ideation Withdrawal symptoms  Comments:  [name] is a 44 y.o. male reporting increased symptoms of depression, substance abuse and status post suicidal attempt by taking polydrug pharmacy with intention to end his life and did not contact anyone until his wife came home. Patient reported he has been tired of living the way has been living with the multiple drug addictions and not able to get better even after trying multiple rehabilitation so were 10-12 years. Patient stated that he is not getting along well with his wife because of his polysubstance abuse and has been feeling guilty about himself and his inability to get clean. Patient reported at one time is able to stay clean about 7 years. Patient was [facility] and also received treatment at [facility] for substance rehabilitation Patient was lastly admitted to [facility] for addiction and he was able to stay sober one to 2 days.  Reportedly patient was suffered with low back disc prolapse in 2004 and had first surgery in 2005. Since then he has more surgeries without relief and then he decided to go on disability and stop working. Patient is also reported he has multiple admission to the hospital for the acute detox treatment of polysubstance this including alcohol, opioids, benzodiazepines, stimulants like Adderall and cocaine. Patient reported his last use of cocaine was 2 days ago and drank 2 containers of beer after taking prescription medication. He took several prescription medication including BP meds, gabapentin, zanaflex, nyquil, zoloft at least 2 weeks supply. Patient was brought here by police and placed on involuntary commitment. He was trying to kill himself he states because he is upset at a lot of stuff he has caused he states.   Estimated length of stay:  3-5 days   New goal(s): to develop effective aftercare plan.   Additional Comments:  Patient and CSW reviewed pt's identified goals and treatment plan. Patient verbalized understanding and agreed to treatment plan. CSW reviewed [facility] "Discharge Process and Patient Involvement" Form. Pt verbalized understanding of information provided and signed form.    Review of initial/current patient goals per problem list:  1. Goal(s): Patient will participate in aftercare plan  Met: No.   Target date: at discharge  As evidenced by: Patient will participate within aftercare plan AEB aftercare provider and housing plan at discharge being identified.  12/12: CSW assessing for appropriate referrals. Pt did not attend morning d/c planning group.   2. Goal (s): Patient will exhibit decreased depressive symptoms and suicidal ideations.  Met: No.    Target date: at discharge  As evidenced by: Patient will utilize self rating of depression at 3 or  below and demonstrate decreased signs of depression or be deemed stable for discharge by MD.  12/12: Pt rates  depression as high and endorses passive SI. Pt on 1:1 for safety but states that he can now contract for safety. MD notified.   3. Goal(s): Patient will demonstrate decreased signs of withdrawal due to substance abuse  Met:No.   Target date:at discharge   As evidenced by: Patient will produce a CIWA/COWS score of 0, have stable vitals signs, and no symptoms of withdrawal.  12/12: Pt reports moderate withdrawals with CIWA of 5 and high BP/pulse.   Attendees: Patient:   06/08/2015 8:19 AM   Family:   06/08/2015 8:19 AM   Physician:  Dr. Carlton Adam, MD 06/08/2015 8:19 AM   Nursing:   Everlean Cherry RN 06/08/2015 8:19 AM   Clinical Social Worker: Maxie Better, LCSW 06/08/2015 8:19 AM   Clinical Social Worker: Erasmo Downer Drinkard LCSWA; Peri Maris LCSWA 06/08/2015 8:19 AM   Other:  Gerline Legacy Nurse Case Manager 06/08/2015 8:19 AM   Other:   06/08/2015 8:19 AM   Other:   06/08/2015 8:19 AM   Other:  06/08/2015 8:19 AM   Other:  06/08/2015 8:19 AM   Other:  06/08/2015 8:19 AM    06/08/2015 8:19 AM    06/08/2015 8:19 AM    06/08/2015 8:19 AM    06/08/2015 8:19 AM    Scribe for Treatment Team:   Maxie Better, LCSW 06/08/2015 8:19 AM

## 2015-06-08 NOTE — Progress Notes (Signed)
Patient ID: Dean MilianJason H [name], male   DOB: 1970-07-04, 44 y.o.   MRN: [id number]  1:1 Nursing Note-  Patient is currently laying in his bed on his abdomen sleeping. Respirations are even and unlabored with no distress. Q15 minute safety checks maintained and 1:1 continued for safety.   DAR: Pt. Denies SI/HI and A/V Hallucinations. He reports he is able to contract for safety if he does start to have those thoughts. He reports last night he was having thoughts of self harm.  He reports sleep is good, appetite is poor, energy level is low, and concentration is poor. He rates his depression and hopelessness 7/10. He rates anxiety at 8/10. Patient does report pain and received PRN Ibuprofen for this. Support and encouragement provided to the patient. Scheduled medications administered to patient per physician's orders. Patient is receptive and cooperative.

## 2015-06-08 NOTE — Progress Notes (Signed)
[facility] Post 1:1 Observation Documentation  For the first (8) hours following discontinuation of 1:1 precautions, a progress note entry by nursing staff should be documented at least every 2 hours, reflecting the patient's behavior, condition, mood, and conversation.  Use the progress notes for additional entries.  Time 1:1 discontinued:  1030  Patient's Behavior:  Patient sitting calmly in the cafeteria at a table.   Patient's Condition:  No distress noted, respirations are even and unlabored.  Patient's Conversation:  No conversation at this time.  Marzetta BoardDopson, Christa E 06/08/2015, 12:46 PM

## 2015-06-09 MED ORDER — ACAMPROSATE CALCIUM 333 MG PO TBEC
666.0000 mg | DELAYED_RELEASE_TABLET | Freq: Three times a day (TID) | ORAL | Status: DC
  Administered 2015-06-10 – 2015-06-11 (×5): 666 mg via ORAL
  Filled 2015-06-09 (×12): qty 2

## 2015-06-09 NOTE — Plan of Care (Signed)
Problem: Alteration in mood Goal: STG-Patient reports thoughts of self-harm to staff Outcome: Progressing Patient denies thoughts to self harm and verbally contracts for safety.  Problem: Diagnosis: Increased Risk For Suicide Attempt Goal: STG-Patient Will Comply With Medication Regime Outcome: Progressing Patient is med compliant.

## 2015-06-09 NOTE — Progress Notes (Signed)
D: Patient in the hallway on approach.  Patient states he had a much better day today.  Patient states, "I am more hopeful."  Patient has flat/blunted affect and depressed mood.  Patient states he has been attending groups. Patient states his goal for today was to work on his discharge plan and aftercare.  Patient states he is continuing to work on it.  Patient appears insightful.  Patient was able to shower and shave tonight and he states he feels better.  Patient denies SI/HI and denies AVH. A: Staff to monitor Q 15 mins for safety.  Encouragement and support offered.  Scheduled medications administered per orders.  Vistaril administered prn for anxiety. R: Patient remains safe on the unit.  Patient attended group tonight.  Patient visible on the unit and interacting with peers.  Patient taking administered medications.

## 2015-06-09 NOTE — Progress Notes (Signed)
Recreation Therapy Notes  Animal-Assisted Activity (AAA) Program Checklist/Progress Notes Patient Eligibility Criteria Checklist & Daily Group note for Rec Tx Intervention  Date: 12.13.2016  Time: 2:45pm [facility] [address] [facility]    AAA/T Program Assumption of Risk Form signed by Patient/ or Parent Legal Guardian yes  Patient is free of allergies or sever asthma yes  Patient reports no fear of animals yes  Patient reports no history of cruelty to animals yes  Patient understands his/her participation is voluntary yes  Behavioral Response: Did not attend.   Marykay Lexenise L Blanchfield, LRT/CTRS  Jearl KlinefelterBlanchfield, Denise L 06/09/2015 3:19 PM
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
## 2015-06-09 NOTE — Progress Notes (Signed)
[facility]  06/09/2015 5:54 PM Oswaldo MilianJason H Beckley  MRN:  [id number] Subjective:  States he continues to have a hard time. He is having the back pain but knows he has to get used to the idea that he is not going to be able to be on opioids. He is also concerned about his alcohol use. Admits he has cravings. He states he does not need to go to a residential treatment program as he has been on many. Would be willing to do a CD IOP Principal Problem: Severe recurrent major depression without psychotic features [facility](HCC) Diagnosis:   Patient Active Problem List   Diagnosis Date Noted  . Polysubstance dependence including opioid type drug, episodic abuse (HCC) [F11.20, F19.20] 06/08/2015  . Severe recurrent major depression without psychotic features (HCC) [F33.2] 06/08/2015  . Suicide attempt by substance overdose (HCC) [T65.92XA] 06/07/2015  . Diarrhea [R19.7] 06/06/2015  . Acetaminophen overdose [T39.1X4A] 06/05/2015  . HTN (hypertension) [I10] 06/05/2015  . Depression [F32.9] 06/05/2015  . Tylenol overdose [T39.1X4A] 06/05/2015  . Cocaine abuse [F14.10] 06/05/2015  . Alcohol abuse [F10.10] 06/05/2015  . Suicidal ideation [R45.851] 06/05/2015  . 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 Snoring disorder [R06.83] 04/08/2013  . 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 Lumbar stenosis with neurogenic claudication [M48.06] 05/09/2011   Total Time spent with patient: 20 minutes  Past Psychiatric History: see admission H and P  Past Medical History:  Past Medical History  Diagnosis Date  . Hypertension   . Arthritis   . Anxiety   . Depression   . 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 Polysubstance abuse     Past Surgical History  Procedure Laterality Date  . Back surgery      5 back surgeries  . Spinal cord stimulator implant    . Nasal sinus surgery    . Lumbar fusion Left 06/13/2013    Procedure: SACROILIAC JOINT FUSION,LEFT;  Surgeon: Reinaldo Meekerandy  O Kritzer, MD;  Location: [facility];  Service: Neurosurgery;  Laterality: Left;  left  . Anterior cervical decomp/discectomy fusion N/A 12/18/2014    Procedure: ANTERIOR CERVICAL DECOMPRESSION/DISCECTOMY FUSION CERVICAL FIVE-SIX;  Surgeon: Aliene Beamsandy Kritzer, MD;  Location: [facility];  Service: Neurosurgery;  Laterality: N/A;  ANTERIOR CERVICAL DECOMPRESSION/DISCECTOMY FUSION CERVICAL FIVE-SIX   Family History: History reviewed. No pertinent family history. Family Psychiatric  History: see Admission H and P Social History:  History  Alcohol Use  . 0.0 oz/week     History  Drug Use  . Yes    Social History   Social History  . Marital Status: Married    Spouse Name: N/A  . Number of Children: N/A  . Years of Education: 13   Occupational History  . disabled     since 2005   Social History Main Topics  . Smoking status: Never Smoker   . Smokeless tobacco: None  . Alcohol Use: 0.0 oz/week  . Drug Use: Yes  . Sexual Activity: Not Asked   Other Topics Concern  . None   Social History Narrative   Additional Social History:    History of alcohol / drug use?: Yes                    Sleep: Fair  Appetite:  Fair  Current Medications: Current Facility-Administered Medications  Medication Dose Route Frequency Provider Last Rate Last Dose  . alum & mag hydroxide-simeth (MAALOX/MYLANTA) 200-200-20 MG/5ML suspension 30 mL  30 mL Oral  Q4H PRN Sanjuana Kava, NP      . amLODipine (NORVASC) tablet 10 mg  10 mg Oral Lowella Curb, NP   10 mg at 06/09/15 0817  . DULoxetine (CYMBALTA) DR capsule 30 mg  30 mg Oral Daily Rachael Fee, MD   30 mg at 06/09/15 0818  . folic acid (FOLVITE) tablet 1 mg  1 mg Oral Daily Sanjuana Kava, NP   1 mg at 06/09/15 0818  . gabapentin (NEURONTIN) capsule 600 mg  600 mg Oral TID Sanjuana Kava, NP   600 mg at 06/09/15 1606  . gemfibrozil (LOPID) tablet 600 mg  600 mg Oral Daily Sanjuana Kava, NP   600 mg at 06/09/15 0818  . hydrOXYzine  (ATARAX/VISTARIL) tablet 25 mg  25 mg Oral Q4H PRN Sanjuana Kava, NP   25 mg at 06/09/15 1151  . ibuprofen (ADVIL,MOTRIN) tablet 600 mg  600 mg Oral Q6H PRN Worthy Flank, NP   600 mg at 06/09/15 1606  . magnesium hydroxide (MILK OF MAGNESIA) suspension 30 mL  30 mL Oral Daily PRN Sanjuana Kava, NP      . QUEtiapine (SEROQUEL) tablet 100 mg  100 mg Oral QHS Worthy Flank, NP   100 mg at 06/08/15 2133  . thiamine (VITAMIN B-1) tablet 100 mg  100 mg Oral Daily Sanjuana Kava, NP   100 mg at 06/09/15 4098    Lab Results: No results found for this or any previous visit (from the past 48 hour(s)).  Physical Findings: AIMS: Facial and Oral Movements Muscles of Facial Expression: None, normal Lips and Perioral Area: None, normal Jaw: None, normal Tongue: None, normal,Extremity Movements Upper (arms, wrists, hands, fingers): None, normal Lower (legs, knees, ankles, toes): None, normal, Trunk Movements Neck, shoulders, hips: None, normal, Overall Severity Severity of abnormal movements (highest score from questions above): None, normal Incapacitation due to abnormal movements: None, normal Patient's awareness of abnormal movements (rate only patient's report): No Awareness, Dental Status Current problems with teeth and/or dentures?: No Does patient usually wear dentures?: No  CIWA:    COWS:     Musculoskeletal: Strength & Muscle Tone: within normal limits Gait & Station: normal Patient leans: normal  Psychiatric Specialty Exam: Review of Systems  Constitutional: Negative.   HENT: Negative.   Eyes: Negative.   Respiratory: Negative.   Cardiovascular: Negative.   Gastrointestinal: Negative.   Genitourinary: Negative.   Musculoskeletal: Positive for back pain and joint pain.  Skin: Negative.   Neurological: Negative.   Endo/Heme/Allergies: Negative.   Psychiatric/Behavioral: Positive for depression and substance abuse.    Blood pressure 125/84, pulse 83, temperature 98 F (36.7  C), temperature source Oral, resp. rate 16, height  (1.727 m), weight 101.606 kg (224 lb).Body mass index is 34.07 kg/(m^2).  General Appearance: Fairly Groomed  Patent attorney::  Fair  Speech:  Clear and Coherent  Volume:  Normal  Mood:  Anxious and worried  Affect:  anxious worried  Thought Process:  Coherent and Goal Directed  Orientation:  Full (Time, Place, and Person)  Thought Content:  symptoms events worries concerns  Suicidal Thoughts:  No  Homicidal Thoughts:  No  Memory:  Immediate;   Fair Recent;   Fair Remote;   Fair  Judgement:  Fair  Insight:  Present  Psychomotor Activity:  Restlessness  Concentration:  Fair  Recall:  [employer] of Knowledge:Fair  Language: Fair  Akathisia:  No  Handed:  Right  AIMS (if indicated):     Assets:  Desire for Improvement Housing Social Support  ADL's:  Intact  Cognition: WNL  Sleep:  Number of Hours: 6.5   Treatment Plan Summary: Daily contact with patient to assess and evaluate symptoms and progress in treatment and Medication management Supportive approach/coping skills Opioid Dependence: work a relapse prevention plan Alcohol dependence: will start Campral to help cravings Depression; will work with Cymbalta and optimize dose and response Pain; will continue Neurontin 600 mg TID Ruminative thinking/insomnia; will continue the Seroquel 100 mg HS Will work with CBT/mindfulness LUGO,IRVING A 06/09/2015, 5:54 PM

## 2015-06-09 NOTE — Progress Notes (Signed)
Attended group 
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
## 2015-06-09 NOTE — [facility] Group Notes (Signed)
[facility] Group Notes:  (Nursing/MHT/Case Management/Adjunct)  Date:  06/09/2015  Time:  0900  Type of Therapy:  Nurse Education  Participation Level:  Active  Participation Quality:  Attentive  Affect:  Blunted  Cognitive:  Appropriate  Insight:  Lacking  Engagement in Group:  Engaged  Modes of Intervention:  Discussion, Education and Support  Summary of Progress/Problems: Patient attended and listened attentively. Shared the most important coping skill he needs to employ going forward is to "ask for help."   Lawrence MarseillesFriedman, Marian Eakes 06/09/2015, 0930

## 2015-06-09 NOTE — Progress Notes (Signed)
Nutrition Brief Note  Patient identified on the Malnutrition Screening Tool (MST) Report  Pt with some weight loss of the last 6 months but insignificant for time frame.  Wt Readings from Last 15 Encounters:  06/07/15 224 lb (101.606 kg)  06/05/15 222 lb 7.1 oz (100.9 kg)  12/18/14 233 lb (105.688 kg)  12/15/14 233 lb 11.2 oz (106.006 kg)  06/11/13 257 lb 6.4 oz (116.756 kg)  04/08/13 260 lb (117.935 kg)  12/02/11 254 lb 12.8 oz (115.577 kg)  05/09/11 257 lb 0.9 oz (116.6 kg)  05/04/11 242 lb 8.1 oz (110 kg)    Body mass index is 34.07 kg/(m^2). Patient meets criteria for obesity based on current BMI.   Diet Order: Diet clear liquid Room service appropriate?: Yes; Fluid consistency:: Thin Pt is also offered choice of unit snacks mid-morning and mid-afternoon.  Pt is eating as desired.   Labs and medications reviewed.   No nutrition interventions warranted at this time. If nutrition issues arise, please consult RD.   Tilda FrancoLindsey Baker, MS, RD, LDN Pager: [phone] After Hours Pager: [phone]

## 2015-06-09 NOTE — [facility] Group Notes (Signed)
[facility] LCSW Group Therapy  06/09/2015 12:59 PM  Type of Therapy:  Group Therapy  Participation Level:  Active  Participation Quality:  Attentive  Affect:  Appropriate  Cognitive:  Alert and Oriented  Insight:  Engaged  Engagement in Therapy:  Engaged  Modes of Intervention:  Confrontation, Discussion, Education, Problem-solving, Rapport Building, Socialization and Support  Summary of Progress/Problems: [facility] Speaker came to talk about his personal journey with substance abuse and addiction. The pt processed ways by which to relate to the speaker. [facility] speaker provided handouts and educational information pertaining to groups and services offered by the [facility].    Smart, Heather LCSW 06/09/2015, 12:59 PM  

## 2015-06-10 MED ORDER — QUETIAPINE FUMARATE 200 MG PO TABS
200.0000 mg | ORAL_TABLET | Freq: Every day | ORAL | Status: DC
  Administered 2015-06-10: 200 mg via ORAL
  Filled 2015-06-10 (×3): qty 1

## 2015-06-10 MED ORDER — LORATADINE 10 MG PO TABS
10.0000 mg | ORAL_TABLET | Freq: Every day | ORAL | Status: DC
  Administered 2015-06-10 – 2015-06-11 (×2): 10 mg via ORAL
  Filled 2015-06-10 (×5): qty 1

## 2015-06-10 NOTE — [facility] Group Notes (Signed)
[facility] LCSW Group Therapy  06/10/2015 3:20 PM  Type of Therapy:  Group Therapy  Participation Level:  Active  Participation Quality:  Attentive  Affect:  Appropriate  Cognitive:  Alert and Oriented  Insight:  Engaged  Engagement in Therapy:  Engaged  Modes of Intervention:  Confrontation, Discussion, Education, Exploration, Problem-solving, Rapport Building, Socialization and Support  Summary of Progress/Problems: Emotion Regulation: This group focused on both positive and negative emotion identification and allowed group members to process ways to identify feelings, regulate negative emotions, and find healthy ways to manage internal/external emotions. Group members were asked to reflect on a time when their reaction to an emotion led to a negative outcome and explored how alternative responses using emotion regulation would have benefited them. Group members were also asked to discuss a time when emotion regulation was utilized when a negative emotion was experienced.   Smart, Heather LCSW 06/10/2015, 3:20 PM

## 2015-06-10 NOTE — Progress Notes (Signed)
[facility] MD Progress Note  06/10/2015 5:46 PM [name]  MRN:  [id number] Subjective:  [name] states that he did not sleep as well last night. States that as he anticipates being  D/C soon he is planning to go back to AA, NA. He got reacquainted with a member of AA who he used to take to meetings. States it was reassuring and reaffirming as he is doing really well. He states that boredom is a major trigger, he is on disability but states he can be more active and involved Principal Problem: Severe recurrent major depression without psychotic features (HCC) Diagnosis:   Patient Active Problem List   Diagnosis Date Noted  . Polysubstance dependence including opioid type drug, episodic abuse (HCC) [F11.20, F19.20] 06/08/2015    Priority: High  . Severe recurrent major depression without psychotic features (HCC) [F33.2] 06/08/2015    Priority: High  . Suicide attempt by substance overdose [facility](HCC) [T65.92XA] 06/07/2015    Priority: High  . Diarrhea [R19.7] 06/06/2015  . Acetaminophen overdose [T39.1X4A] 06/05/2015  . HTN (hypertension) [I10] 06/05/2015  . Depression [F32.9] 06/05/2015  . Tylenol overdose [T39.1X4A] 06/05/2015  . Cocaine abuse [F14.10] 06/05/2015  . Alcohol abuse [F10.10] 06/05/2015  . Suicidal ideation [R45.851] 06/05/2015  . 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 Snoring disorder [R06.83] 04/08/2013  . 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 Lumbar stenosis with neurogenic claudication [M48.06] 05/09/2011   Total Time spent with patient: 30 minutes  Past Psychiatric History: see admission H and P  Past Medical History:  Past Medical History  Diagnosis Date  . Hypertension   . Arthritis   . Anxiety   . Depression   . 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 Polysubstance abuse     Past Surgical History  Procedure Laterality Date  . Back surgery      5 back surgeries  . Spinal cord stimulator implant    . Nasal sinus surgery     . Lumbar fusion Left 06/13/2013    Procedure: SACROILIAC JOINT FUSION,LEFT;  Surgeon: Reinaldo Meekerandy O Kritzer, MD;  Location: [facility];  Service: Neurosurgery;  Laterality: Left;  left  . Anterior cervical decomp/discectomy fusion N/A 12/18/2014    Procedure: ANTERIOR CERVICAL DECOMPRESSION/DISCECTOMY FUSION CERVICAL FIVE-SIX;  Surgeon: Aliene Beamsandy Kritzer, MD;  Location: [facility];  Service: Neurosurgery;  Laterality: N/A;  ANTERIOR CERVICAL DECOMPRESSION/DISCECTOMY FUSION CERVICAL FIVE-SIX   Family History: History reviewed. No pertinent family history. Family Psychiatric  History: see Admission H and P Social History:  History  Alcohol Use  . 0.0 oz/week     History  Drug Use  . Yes    Social History   Social History  . Marital Status: Married    Spouse Name: N/A  . Number of Children: N/A  . Years of Education: 13   Occupational History  . disabled     since 2005   Social History Main Topics  . Smoking status: Never Smoker   . Smokeless tobacco: None  . Alcohol Use: 0.0 oz/week  . Drug Use: Yes  . Sexual Activity: Not Asked   Other Topics Concern  . None   Social History Narrative   Additional Social History:    History of alcohol / drug use?: Yes                    Sleep: Poor  Appetite:  Fair  Current Medications: Current Facility-Administered Medications  Medication Dose Route Frequency Provider Last Rate Last  Dose  . acamprosate (CAMPRAL) tablet 666 mg  666 mg Oral TID WC Kerry Hough, PA-C   666 mg at 06/10/15 1652  . alum & mag hydroxide-simeth (MAALOX/MYLANTA) 200-200-20 MG/5ML suspension 30 mL  30 mL Oral Q4H PRN Sanjuana Kava, NP      . amLODipine (NORVASC) tablet 10 mg  10 mg Oral Lowella Curb, NP   10 mg at 06/10/15 1610  . DULoxetine (CYMBALTA) DR capsule 30 mg  30 mg Oral Daily Rachael Fee, MD   30 mg at 06/10/15 0836  . folic acid (FOLVITE) tablet 1 mg  1 mg Oral Daily Sanjuana Kava, NP   1 mg at 06/10/15 0836  . gabapentin  (NEURONTIN) capsule 600 mg  600 mg Oral TID Sanjuana Kava, NP   600 mg at 06/10/15 1652  . gemfibrozil (LOPID) tablet 600 mg  600 mg Oral Daily Sanjuana Kava, NP   600 mg at 06/10/15 0836  . hydrOXYzine (ATARAX/VISTARIL) tablet 25 mg  25 mg Oral Q4H PRN Sanjuana Kava, NP   25 mg at 06/09/15 2216  . ibuprofen (ADVIL,MOTRIN) tablet 600 mg  600 mg Oral Q6H PRN Worthy Flank, NP   600 mg at 06/10/15 9604  . loratadine (CLARITIN) tablet 10 mg  10 mg Oral Daily Rachael Fee, MD   10 mg at 06/10/15 1313  . magnesium hydroxide (MILK OF MAGNESIA) suspension 30 mL  30 mL Oral Daily PRN Sanjuana Kava, NP      . QUEtiapine (SEROQUEL) tablet 200 mg  200 mg Oral QHS Rachael Fee, MD      . thiamine (VITAMIN B-1) tablet 100 mg  100 mg Oral Daily Sanjuana Kava, NP   100 mg at 06/10/15 5409    Lab Results: No results found for this or any previous visit (from the past 48 hour(s)).  Physical Findings: AIMS: Facial and Oral Movements Muscles of Facial Expression: None, normal Lips and Perioral Area: None, normal Jaw: None, normal Tongue: None, normal,Extremity Movements Upper (arms, wrists, hands, fingers): None, normal Lower (legs, knees, ankles, toes): None, normal, Trunk Movements Neck, shoulders, hips: None, normal, Overall Severity Severity of abnormal movements (highest score from questions above): None, normal Incapacitation due to abnormal movements: None, normal Patient's awareness of abnormal movements (rate only patient's report): No Awareness, Dental Status Current problems with teeth and/or dentures?: No Does patient usually wear dentures?: No  CIWA:    COWS:     Musculoskeletal: Strength & Muscle Tone: within normal limits Gait & Station: normal Patient leans: normal  Psychiatric Specialty Exam: Review of Systems  Constitutional: Negative.   HENT: Negative.   Eyes: Negative.   Respiratory: Negative.   Cardiovascular: Negative.   Gastrointestinal: Negative.   Genitourinary:  Negative.   Musculoskeletal: Positive for back pain.  Skin: Negative.   Neurological: Negative.   Endo/Heme/Allergies: Negative.   Psychiatric/Behavioral: Positive for substance abuse. The patient has insomnia.     Blood pressure 130/80, pulse 74, temperature 97.7 F (36.5 C), temperature source Oral, resp. rate 18, height  (1.727 m), weight 101.606 kg (224 lb).Body mass index is 34.07 kg/(m^2).  General Appearance: Fairly Groomed  Patent attorney::  Fair  Speech:  Clear and Coherent  Volume:  Normal  Mood:  Euthymic  Affect:  Appropriate  Thought Process:  Coherent and Goal Directed  Orientation:  Full (Time, Place, and Person)  Thought Content:  symptoms events worries concerns  Suicidal Thoughts:  No  Homicidal Thoughts:  No  Memory:  Immediate;   Fair Recent;   Fair Remote;   Fair  Judgement:  Fair  Insight:  Present and Shallow  Psychomotor Activity:  Normal  Concentration:  Fair  Recall:  [employer] of Knowledge:Fair  Language: Fair  Akathisia:  No  Handed:  Right  AIMS (if indicated):     Assets:  Desire for Improvement Housing Social Support  ADL's:  Intact  Cognition: WNL  Sleep:  Number of Hours: 6.75   Treatment Plan Summary: Daily contact with patient to assess and evaluate symptoms and progress in treatment and Medication management  Supportive approach/coping skills Polysubstance Dependence: continue to work a relapse prevention plan Depression; continue the Cymbalta 30 mg daily Pain; continue the Neurontin 600 mg  Ruminative thinking/insomnia: increase the Seroquel to 200 mg HS Work with CBT/mindfulness Will be reassess for a possible D/C tommorrow  LUGO,IRVING A 06/10/2015, 5:46 PM

## 2015-06-10 NOTE — Plan of Care (Signed)
Problem: Alteration in mood Goal: STG-Patient is able to discuss feelings and issues (Patient is able to discuss feelings and issues leading to depression)  Outcome: Progressing Patient acknowledging he has many reasons to live for - supportive wife, children and grandchildren. Expresses desire to get and stay clean.  Problem: Ineffective individual coping Goal: STG: Patient will remain free from self harm Outcome: Progressing Patient has not engaged in self harm and denies SI
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
## 2015-06-10 NOTE — [facility] Suicide Risk Assessment (Signed)
[facility] INPATIENT:  Family/Significant Other Suicide Prevention Education  Suicide Prevention Education:  Education Completed; Sallee Langeaula Vanhorne (pt's wife) [phone] has been identified by the patient as the family member/significant other with whom the patient will be residing, and identified as the person(s) who will aid the patient in the event of a mental health crisis (suicidal ideations/suicide attempt).  With written consent from the patient, the family member/significant other has been provided the following suicide prevention education, prior to the and/or following the discharge of the patient.  The suicide prevention education provided includes the following:  Suicide risk factors  Suicide prevention and interventions  National Suicide Hotline telephone number  [facility] assessment telephone number  [facility] Emergency Assistance 911  [facility] and/or Residential Mobile Crisis Unit telephone number  Request made of family/significant other to:  Remove weapons (e.g., guns, rifles, knives), all items previously/currently identified as safety concern.    Remove drugs/medications (over-the-counter, prescriptions, illicit drugs), all items previously/currently identified as a safety concern.  The family member/significant other verbalizes understanding of the suicide prevention education information provided.  The family member/significant other agrees to remove the items of safety concern listed above.  Smart, Heather LCSW 06/10/2015, 2:30 PM

## 2015-06-10 NOTE — Progress Notes (Signed)
Pt attended evening AA group. 

## 2015-06-10 NOTE — Progress Notes (Signed)
Patient up and visible in the milieu. On 1:1 states he is feeling a bit more "down" today however rates depression at a 4/10, along with depression and anxiety at a 4/10. Continues to complain of chronic back pain of a 7/10 for which he is being medicated. Reports his goal is "[place]." Encouraged patient to no rush time here in tx. Emotional support provided. Medicated per orders. Reviewed fall teaching and patient verbalizes understanding. He denies SI/HI. Remains safe on level III obs. Lawrence MarseillesFriedman, Marian Eakes

## 2015-06-10 NOTE — Progress Notes (Signed)
Recreation Therapy Notes  Date: 12.14.2016  Time: 9:30am Location: 300 Hall Group Room   Group Topic: Stress Management  Goal Area(s) Addresses:  Patient will actively participate in stress management techniques presented during session.   Behavioral Response: Appropriate   Intervention: Stress management techniques  Activity :  Deep Breathing and Guided Imagery. LRT provided instruction and demonstration on practice of Guided Imagery. Technique was coupled with deep breathing.   Education:  Stress Management, Discharge Planning.   Education Outcome: Acknowledges education  Clinical Observations/Feedback: Patient actively engaged in technique introduced, expressed no concerns and demonstrated ability to practice independently post d/c.   Marykay Lexenise L Blanchfield, LRT/CTRS  Blanchfield, Denise L 06/10/2015 2:11 PM

## 2015-06-10 NOTE — [facility] Group Notes (Signed)
[facility]   06/10/2015 9:22 AM  Participation Quality:  Appropriate   Mood/Affect:  Appropriate  Depression Rating:  4  Anxiety Rating:  4  Thoughts of Suicide:  No Will you contract for safety?   NA  Current AVH:  No  Plan for Discharge/Comments:  Pt reports that he is ready to d/c today or tomorrow. Pt plans to follow-up with [facility]. Pt reports poor sleep. "The seroquel isn't working."   [employer]ransportation Means: wife   Supports: Naval architectwife/family   Smart, Conservation officer, natureHeather LCSW

## 2015-06-11 DIAGNOSIS — F332 Major depressive disorder, recurrent severe without psychotic features: Principal | ICD-10-CM

## 2015-06-11 MED ORDER — DULOXETINE HCL 30 MG PO CPEP: 30.0000 mg | ORAL_CAPSULE | Freq: Every day | ORAL | Status: DC

## 2015-06-11 MED ORDER — LORATADINE 10 MG PO TABS: 10.0000 mg | ORAL_TABLET | Freq: Every day | ORAL | Status: DC

## 2015-06-11 MED ORDER — QUETIAPINE FUMARATE 200 MG PO TABS: 200.0000 mg | ORAL_TABLET | Freq: Every day | ORAL | Status: DC

## 2015-06-11 MED ORDER — HYDROXYZINE HCL 25 MG PO TABS: 25.0000 mg | ORAL_TABLET | Freq: Three times a day (TID) | ORAL | Status: DC | PRN

## 2015-06-11 MED ORDER — ACAMPROSATE CALCIUM 333 MG PO TBEC: 666.0000 mg | DELAYED_RELEASE_TABLET | Freq: Three times a day (TID) | ORAL | Status: DC

## 2015-06-11 MED ORDER — GABAPENTIN 300 MG PO CAPS: 600.0000 mg | ORAL_CAPSULE | Freq: Three times a day (TID) | ORAL | Status: DC

## 2015-06-11 MED ORDER — AMLODIPINE BESYLATE 10 MG PO TABS: 10.0000 mg | ORAL_TABLET | ORAL | Status: DC

## 2015-06-11 MED ORDER — GEMFIBROZIL 600 MG PO TABS: 600.0000 mg | ORAL_TABLET | Freq: Every day | ORAL | Status: DC

## 2015-06-11 NOTE — Progress Notes (Signed)
  [facility] :  Will you be returning to the same living situation after discharge:  Yes,  home with wife At discharge, do you have transportation home?: Yes,  wife Do you have the ability to pay for your medications: Yes,  [employer](private insurance)  Release of information consent forms completed and submitted to medical records.  Patient to Follow up at: Follow-up Information    Follow up with [facility]-CDIOP.   Why:  appt needed prior to d/c.    Contact information:   [address] [place], [place] [address] Phone: [phone] Fax: [phone]    Charmian Muffnn Evans will contact you with appt time/date for orientation and a start date for CDIOP.   Next level of care provider has access to [facility] Link:yes  Patient denies SI/HI: Yes,  during group/self report.     Safety Planning and Suicide Prevention discussed: Yes,  SPE completed with pt's wife. SPI pamphlet and mobile crisis information provided to pt   Have you used any form of tobacco in the last 30 days? (Cigarettes, Smokeless Tobacco, Cigars, and/or Pipes): No  Has patient been referred to the Quitline?: N/A patient is not a smoker  Patient has been referred for addiction treatment: Yes-see above.   Smart, Heather LCSW 06/11/2015, 9:33 AM

## 2015-06-11 NOTE — Progress Notes (Signed)
Patient ID: [name], male   DOB: [birth year], 44 y.o.   MRN: [id number]  Pt currently presents with a pleasant affect and anxious behavior. Per self inventory, pt rates depression, hopelessness and anxiety at a 2. Pt's daily goal is to "go home."Pt reports fair sleep, a good appetite, low energy and good concentration.   Pt provided with medications per providers orders. Pt's labs and vitals were monitored throughout the day. Pt supported emotionally and encouraged to express concerns and questions. Pt educated on medications and suicide prevention resources.   Pt's safety ensured with 15 minute and environmental checks. Pt currently denies SI/HI and A/V hallucinations. Pt verbally agrees to seek staff if SI/HI or A/VH occurs and to consult with staff before acting on these thoughts. Pt reports that he is looking forward to going home and start meetings. Pt seems optimistic about recovery. To be discharged today. Will continue POC.

## 2015-06-11 NOTE — Plan of Care (Signed)
Problem: Diagnosis: Increased Risk For Suicide Attempt Goal: STG-Patient Will Comply With Medication Regime Outcome: Progressing Pt compliant with taking scheduled medications as prescribed.      

## 2015-06-11 NOTE — Discharge Summary (Signed)
Physician Discharge Summary Note  Patient:  [name] is an 44 y.o., male MRN:  [id number] DOB:  06/18/1971 Patient phone:  [phone] (home)  Patient address:   [address] [place] [address],  Total Time spent with patient: 30 minutes  Date of Admission:  06/07/2015 Date of Discharge: 06/11/2015  Reason for Admission:  Depression  Principal Problem: Severe recurrent major depression without psychotic features [facility]) Discharge Diagnoses: Patient Active Problem List   Diagnosis Date Noted  . Polysubstance dependence including opioid type drug, episodic abuse (HCC) [F11.20, F19.20] 06/08/2015  . Severe recurrent major depression without psychotic features (HCC) [F33.2] 06/08/2015  . Suicide attempt by substance overdose (HCC) [T65.92XA] 06/07/2015  . Diarrhea [R19.7] 06/06/2015  . Acetaminophen overdose [T39.1X4A] 06/05/2015  . HTN (hypertension) [I10] 06/05/2015  . Depression [F32.9] 06/05/2015  . Tylenol overdose [T39.1X4A] 06/05/2015  . Cocaine abuse [F14.10] 06/05/2015  . Alcohol abuse [F10.10] 06/05/2015  . Suicidal ideation [R45.851] 06/05/2015  . 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 Snoring disorder [R06.83] 04/08/2013  . 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 Lumbar stenosis with neurogenic claudication [M48.06] 05/09/2011    Past Psychiatric History:  Recurrent depression  Past Medical History:  Past Medical History  Diagnosis Date  . Hypertension   . Arthritis   . Anxiety   . Depression   . 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 Polysubstance abuse     Past Surgical History  Procedure Laterality Date  . Back surgery      5 back surgeries  . Spinal cord stimulator implant    . Nasal sinus surgery    . Lumbar fusion Left 06/13/2013    Procedure: SACROILIAC JOINT FUSION,LEFT;  Surgeon: Reinaldo Meeker, MD;  Location: [facility];  Service: Neurosurgery;  Laterality: Left;  left  . Anterior cervical  decomp/discectomy fusion N/A 12/18/2014    Procedure: ANTERIOR CERVICAL DECOMPRESSION/DISCECTOMY FUSION CERVICAL FIVE-SIX;  Surgeon: Aliene Beams, MD;  Location: [facility];  Service: Neurosurgery;  Laterality: N/A;  ANTERIOR CERVICAL DECOMPRESSION/DISCECTOMY FUSION CERVICAL FIVE-SIX   Family History: History reviewed. No pertinent family history. Family Psychiatric  History: denies Social History:  History  Alcohol Use  . 0.0 oz/week     History  Drug Use  . Yes    Social History   Social History  . Marital Status: Married    Spouse Name: N/A  . Number of Children: N/A  . Years of Education: 13   Occupational History  . disabled     since 2005   Social History Main Topics  . Smoking status: Never Smoker   . Smokeless tobacco: None  . Alcohol Use: 0.0 oz/week  . Drug Use: Yes  . Sexual Activity: Not Asked   Other Topics Concern  . None   Social History Narrative    Hospital Course:  [name] was admitted for Severe recurrent major depression without psychotic features (HCC) and crisis management.  He was treated with the following medications listed below.  [name] was discharged with current medication and was instructed on how to take medications as prescribed; (details listed below under Medication List).  Medical problems were identified and treated as needed.  Home medications were restarted as appropriate.  Improvement was monitored by observation and [name] daily report of symptom reduction.  Emotional and mental status was monitored by daily self-inventory reports completed by [name] and clinical staff.  [name] was evaluated by the treatment team for stability and plans for continued recovery upon discharge.  [name] motivation was an integral factor for scheduling further treatment.  Employment, transportation, bed availability, health status, family support, and any pending legal issues were also considered  during his hospital stay.  He was offered further treatment options upon discharge including but not limited to Residential, Intensive Outpatient, and Outpatient treatment.  [name] will follow up with the services as listed below under Follow Up Information.     Upon completion of this admission the [name] was both mentally and medically stable for discharge denying suicidal/homicidal ideation, auditory/visual/tactile hallucinations, delusional thoughts and paranoia.       Physical Findings: AIMS: Facial and Oral Movements Muscles of Facial Expression: None, normal Lips and Perioral Area: None, normal Jaw: None, normal Tongue: None, normal,Extremity Movements Upper (arms, wrists, hands, fingers): None, normal Lower (legs, knees, ankles, toes): None, normal, Trunk Movements Neck, shoulders, hips: None, normal, Overall Severity Severity of abnormal movements (highest score from questions above): None, normal Incapacitation due to abnormal movements: None, normal Patient's awareness of abnormal movements (rate only patient's report): No Awareness, Dental Status Current problems with teeth and/or dentures?: No Does patient usually wear dentures?: No  CIWA:    COWS:     Musculoskeletal: Strength & Muscle Tone: within normal limits Gait & Station: normal Patient leans: N/A  Psychiatric Specialty Exam:  See MD SRA Review of Systems  All other systems reviewed and are negative.   Blood pressure 120/81, pulse 86, temperature 97.9 F (36.6 C), temperature source Oral, resp. rate 20, height  (1.727 m), weight 101.606 kg (224 lb).Body mass index is 34.07 kg/(m^2).  Have you used any form of tobacco in the last 30 days? (Cigarettes, Smokeless Tobacco, Cigars, and/or Pipes): No  Has this patient used any form of tobacco in the last 30 days? (Cigarettes, Smokeless Tobacco, Cigars, and/or Pipes) Yes, N/A  Metabolic Disorder Labs:  No results found for: HGBA1C, MPG No  results found for: PROLACTIN No results found for: CHOL, TRIG, HDL, CHOLHDL, VLDL, LDLCALC  See Psychiatric Specialty Exam and Suicide Risk Assessment completed by Attending Physician prior to discharge.  Discharge destination:  Home  Is patient on multiple antipsychotic therapies at discharge:  Yes,   Do you recommend tapering to monotherapy for antipsychotics?  Negative   Has Patient had three or more failed trials of antipsychotic monotherapy by history:  No  Recommended Plan for Multiple Antipsychotic Therapies: NA  Discharge Instructions    Activity as tolerated - No restrictions    Complete by:  As directed      Diet general    Complete by:  As directed      Discharge instructions    Complete by:  As directed   Take all of you medications as prescribed by your mental healthcare provider.  Report any adverse effects and reactions from your medications to your outpatient provider promptly.  Do not engage in alcohol and or illegal drug use while on prescription medicines. Keep all scheduled appointments. This is to ensure that you are getting refills on time and to avoid any interruption in your medication.  If you are unable to keep an appointment call to reschedule.  Be sure to follow up with resources and follow ups given. In the event of worsening symptoms call the crisis hotline, 911, and or go to the nearest emergency department for appropriate evaluation and treatment of  symptoms. Follow-up with your primary care provider for your medical issues, concerns and or health care needs.            Medication List    STOP taking these medications        diazepam 10 MG tablet  Commonly known as:  VALIUM     gabapentin 600 MG tablet  Commonly known as:  NEURONTIN  Replaced by:  gabapentin 300 MG capsule     loperamide 2 MG capsule  Commonly known as:  IMODIUM     tiZANidine 4 MG capsule  Commonly known as:  ZANAFLEX      TAKE these medications      Indication    amLODipine 10 MG tablet  Commonly known as:  NORVASC  Take 1 tablet (10 mg total) by mouth every morning.   Indication:  High Blood Pressure     DULoxetine 30 MG capsule  Commonly known as:  CYMBALTA  Take 1 capsule (30 mg total) by mouth daily.   Indication:  mood stabilization     folic acid 1 MG tablet  Commonly known as:  FOLVITE  Take 1 tablet (1 mg total) by mouth daily.      gabapentin 300 MG capsule  Commonly known as:  NEURONTIN  Take 2 capsules (600 mg total) by mouth 3 (three) times daily.   Indication:  mood stabilization     gemfibrozil 600 MG tablet  Commonly known as:  LOPID  Take 1 tablet (600 mg total) by mouth daily.   Indication:  hyperlipidemia     hydrOXYzine 25 MG tablet  Commonly known as:  ATARAX/VISTARIL  Take 1 tablet (25 mg total) by mouth every 8 (eight) hours as needed for anxiety (Sleep).   Indication:  Anxiety Neurosis     loratadine 10 MG tablet  Commonly known as:  CLARITIN  Take 1 tablet (10 mg total) by mouth daily.   Indication:  Hayfever     QUEtiapine 200 MG tablet  Commonly known as:  SEROQUEL  Take 1 tablet (200 mg total) by mouth at bedtime.   Indication:  mood disorder     thiamine 100 MG tablet  Take 1 tablet (100 mg total) by mouth daily.            Follow-up Information    Follow up with [facility].   Why:  Charmian MuffAnn Evans will contact you at discharge with appt time/date for orientation and start date for CDIOP.    Contact information:   [address] [place], [place] [address] Phone: [phone] Fax: [phone]      Follow-up recommendations:  Activity:  as tol Diet:  as tol  Comments:  1.  Take all your medications as prescribed.              2.  Report any adverse side effects to outpatient provider.                       3.  Patient instructed to not use alcohol or illegal drugs while on prescription medicines.            4.  In the event of worsening symptoms, instructed patient to call 911, the  crisis hotline or go to nearest emergency room for evaluation of symptoms.  Signed: Velna HatchetSheila May Agustin AGNP-BC 06/11/2015, 12:16 PM  I personally assessed the patient and formulated the plan Madie RenoIrving A. Dub MikesLugo, M.D.

## 2015-06-11 NOTE — Progress Notes (Signed)
Patient ID: Dean MilianJason H [name], male   DOB: June 25, 1971, 44 y.o.   MRN: [id number]  Pt discharged home with his wife. Pt was stable and appreciative at that time. All papers and prescriptions were given and valuables returned. Verbal understanding expressed. Denies SI/HI and A/VH. Pt given opportunity to express concerns and ask questions.

## 2015-06-11 NOTE — [facility] Suicide Risk Assessment (Signed)
[facility]   Demographic Factors:  44 year  Old married male, lives with wife, on disability, has one adult daughter.   Total Time spent with patient: 30 minutes  Musculoskeletal: Strength & Muscle Tone: within normal limits Gait & Station: normal Patient leans: N/A  Psychiatric Specialty Exam: Physical Exam  ROS  Blood pressure 120/81, pulse 86, temperature 97.9 F (36.6 C), temperature source Oral, resp. rate 20, height 5\' 8"  (1.727 m), weight 224 lb (101.606 kg).Body mass index is 34.07 kg/(m^2).  General Appearance: Well Groomed  Eye Contact::  Good  Speech:  Normal Rate409  Volume:  Normal  Mood:  imporved mood, denies feeling depressed at this time  Affect:  Appropriate  Thought Process:  Linear  Orientation:  Full (Time, Place, and Person)  Thought Content:  no hallucinations, no delusions   Suicidal Thoughts:  No denies any suicidal ideations, denies any self injurious ideations   Homicidal Thoughts:  No denies any violent or homicidal ideations  Memory:  recent and remote grossly intact   Judgement:  Other:  improved   Insight:  Present  Psychomotor Activity:  Normal  Concentration:  Good  Recall:  Good  Fund of Knowledge:Good  Language: Good  Akathisia:  Negative  Handed:  Right  AIMS (if indicated):     Assets:  Communication Skills Desire for Improvement Resilience Social Support  Sleep:  Number of Hours: 6.75  Cognition: WNL  ADL's:  Intact   Have you used any form of tobacco in the last 30 days? (Cigarettes, Smokeless Tobacco, Cigars, and/or Pipes): No  Has this patient used any form of tobacco in the last 30 days? (Cigarettes, Smokeless Tobacco, Cigars, and/or Pipes) No  Mental Status Per Nursing Assessment::   On Admission:  Suicidal ideation indicated by patient, Self-harm thoughts, Self-harm behaviors, Intention to act on suicide plan  Current Mental Status by Physician: At this time patient is improved , mood is  euthymic, reports feeling more positive, feeling optimistic, no suicidal ideations, no homicidal thoughts, no psychotic symptoms, future oriented, no current WDL symptoms  Loss Factors: Financial issues, strained relationships related to substance abuse   Historical Factors: History of polysubstance dependence ( alcohol, opiates, cocaine), history of depression  Risk Reduction Factors:   Sense of responsibility to family, Living with another person, especially a relative, Positive social support and Positive coping skills or problem solving skills  Continued Clinical Symptoms:  As noted, improved compared to admission  Cognitive Features That Contribute To Risk:  No gross cognitive deficits noted upon discharge. Is alert , attentive, and oriented x 3   Suicide Risk:  Mild:  Suicidal ideation of limited frequency, intensity, duration, and specificity.  There are no identifiable plans, no associated intent, mild dysphoria and related symptoms, good self-control (both objective and subjective assessment), few other risk factors, and identifiable protective factors, including available and accessible social support.  Principal Problem: Severe recurrent major depression without psychotic features [facility](HCC) Discharge Diagnoses:  Patient Active Problem List   Diagnosis Date Noted  . Polysubstance dependence including opioid type drug, episodic abuse (HCC) [F11.20, F19.20] 06/08/2015  . Severe recurrent major depression without psychotic features (HCC) [F33.2] 06/08/2015  . Suicide attempt by substance overdose (HCC) [T65.92XA] 06/07/2015  . Diarrhea [R19.7] 06/06/2015  . Acetaminophen overdose [T39.1X4A] 06/05/2015  . HTN (hypertension) [I10] 06/05/2015  . Depression [F32.9] 06/05/2015  . Tylenol overdose [T39.1X4A] 06/05/2015  . Cocaine abuse [F14.10] 06/05/2015  . Alcohol abuse [F10.10] 06/05/2015  . Suicidal  ideation [R45.851] 06/05/2015  . 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 Snoring disorder [R06.83] 04/08/2013  . 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 Lumbar stenosis with neurogenic claudication [M48.06] 05/09/2011    Follow-up Information    Follow up with [facility].   Why:  Charmian Muff will contact you at discharge with appt time/date for orientation and start date for CDIOP.    Contact information:   [address] [place], [place] [address] Phone: [phone] Fax: [phone]      Plan Of Care/Follow-up recommendations:  Activity:  as tolerated  Diet:  Heart Healthy Tests:  NA Other:  See below   Is patient on multiple antipsychotic therapies at discharge:  No   Has Patient had three or more failed trials of antipsychotic monotherapy by history:  No  Recommended Plan for Multiple Antipsychotic Therapies: NA  Patient is discharging in good spirits. Plans to return home. Plans to follow up as above. Plans  To go to AA regularly Has an established PCP ( Dr. Alfonse Ras) for management of medical issues as needed    [facility], [facility] 06/11/2015, 11:27 AM

## 2015-06-11 NOTE — Progress Notes (Signed)
D: Pt presents with a blunted affect and depressed mood. Pt reports a plan to stay in contact with his one of his former AA acquaintances. [facility]. Pt verbalizes a plan to follow-up with IOP services. Pt denies any SI. Pt verbally contracts for safety.  A: Writer administered scheduled medications to pt, per MD orders. Continued support and availability as needed was extended to this pt. Staff continues to monitor pt with q4015min checks.  R: No adverse drug reactions noted. Pt receptive to treatment. Pt remains safe at this time.

## 2015-06-11 NOTE — Progress Notes (Addendum)
Patient ID: [name], male   DOB: Jul 22, 1970, 44 y.o.   MRN: [id number]  Adult Psychoeducational Group Note  Date:  06/11/2015 Time: 09:15AM  Group Topic/Focus:  Making Healthy Choices:   The focus of this group is to help patients identify negative/unhealthy choices they were using prior to admission and identify positive/healthier coping strategies to replace them upon discharge.  Participation Level:  Active  Participation Quality:  Appropriate  Affect:  Appropriate  Cognitive:  Appropriate  Insight: Appropriate  Engagement in Group:  Engaged  Modes of Intervention:  Activity, Discussion, Education and Support  Additional Comments:  Pt able to identify one healthy lifestyle/leisure activity in which he can participate post discharge. Pt supportive and encouraging of others during discussion.   Aurora Maskwyman, Sara E 06/11/2015, 10:06 AM

## 2015-06-11 NOTE — Tx Team (Signed)
Interdisciplinary Treatment Plan Update (Adult)  Date:  06/11/2015  Time Reviewed:  9:39 AM   Progress in Treatment: Attending groups: Yes Participating in groups:  Yes Taking medication as prescribed:  Yes. Tolerating medication:  Yes. Family/Significant othe contact made:  SPE completed with this pt.  Patient understands diagnosis:  Yes. and As evidenced by:  seeking treatment for SI, depression, alcohol/cocaine abuse, and for medication stabilization. Discussing patient identified problems/goals with staff:  Yes. Medical problems stabilized or resolved:  Yes. Denies suicidal/homicidal ideation: Yes Issues/concerns per patient self-inventory:  Other:  Discharge Plan or Barriers: Pt plans to return home and follow-up with [facility]. Pt given [facility] information per wife's request, Mental Health Association information, AA/NA list, and [employer] information for his wife.   Reason for Continuation of Hospitalization: None   Comments:  [name] is a 44 y.o. male reporting increased symptoms of depression, substance abuse and status post suicidal attempt by taking polydrug pharmacy with intention to end his life and did not contact anyone until his wife came home. Patient reported he has been tired of living the way has been living with the multiple drug addictions and not able to get better even after trying multiple rehabilitation so were 10-12 years. Patient stated that he is not getting along well with his wife because of his polysubstance abuse and has been feeling guilty about himself and his inability to get clean. Patient reported at one time is able to stay clean about 7 years. Patient was [facility] and also received treatment at [facility] for substance rehabilitation Patient was lastly admitted to [facility] for addiction and he was able to stay sober one to 2 days. Reportedly patient was suffered  with low back disc prolapse in 2004 and had first surgery in 2005. Since then he has more surgeries without relief and then he decided to go on disability and stop working. Patient is also reported he has multiple admission to the hospital for the acute detox treatment of polysubstance this including alcohol, opioids, benzodiazepines, stimulants like Adderall and cocaine. Patient reported his last use of cocaine was 2 days ago and drank 2 containers of beer after taking prescription medication. He took several prescription medication including BP meds, gabapentin, zanaflex, nyquil, zoloft at least 2 weeks supply. Patient was brought here by police and placed on involuntary commitment. He was trying to kill himself he states because he is upset at a lot of stuff he has caused he states.   Estimated length of stay:  D/c today  Additional Comments:  Patient and CSW reviewed pt's identified goals and treatment plan. Patient verbalized understanding and agreed to treatment plan. CSW reviewed [facility] "Discharge Process and Patient Involvement" Form. Pt verbalized understanding of information provided and signed form.    Review of initial/current patient goals per problem list:  1. Goal(s): Patient will participate in aftercare plan  Met: Yes  Target date: at discharge  As evidenced by: Patient will participate within aftercare plan AEB aftercare provider and housing plan at discharge being identified.  12/12: CSW assessing for appropriate referrals. Pt did not attend morning d/c planning group.   12/15: Pt plans to return home; follow-up with [facility].   2. Goal (s): Patient will exhibit decreased depressive symptoms and suicidal ideations.  Met: Yes   Target date: at discharge  As evidenced by: Patient will utilize self rating of depression at 3 or below and  demonstrate decreased signs of depression or be deemed stable for discharge by MD.  12/12: Pt rates depression as high and  endorses passive SI. Pt on 1:1 for safety but states that he can now contract for safety. MD notified.   12/15: Pt rates depression as low and presents with pleasant mood/calm affect. Denies SI/HI/AVH.   3. Goal(s): Patient will demonstrate decreased signs of withdrawal due to substance abuse  Met:Yes  Target date:at discharge   As evidenced by: Patient will produce a CIWA/COWS score of 0, have stable vitals signs, and no symptoms of withdrawal.  12/12: Pt reports moderate withdrawals with CIWA of 5 and high BP/pulse.   12/15: Pt reports no signs of withdrawal with CIWA of 0 and stable vitals.  Attendees: Patient:   06/11/2015 9:39 AM   Family:   06/11/2015 9:39 AM   Physician:  Dr. Cristy Friedlander MD  06/11/2015 9:39 AM   Nursing:   Betsey Holiday RN 06/11/2015 9:39 AM   Clinical Social Worker: Maxie Better, LCSW 06/11/2015 9:39 AM   Clinical Social Worker:Lauren Madie Reno 06/11/2015 9:39 AM   Other:  Gerline Legacy Nurse Case Manager 06/11/2015 9:39 AM   Other:   06/11/2015 9:39 AM   Other:   06/11/2015 9:39 AM   Other:  06/11/2015 9:39 AM   Other:  06/11/2015 9:39 AM   Other:  06/11/2015 9:39 AM    06/11/2015 9:39 AM    06/11/2015 9:39 AM    06/11/2015 9:39 AM    06/11/2015 9:39 AM    Scribe for Treatment Team:   Maxie Better, LCSW 06/11/2015 9:39 AM

## 2015-06-15 ENCOUNTER — Other Ambulatory Visit (HOSPITAL_COMMUNITY): Payer: 59 | Attending: Medical | Admitting: Psychology

## 2015-06-15 ENCOUNTER — Encounter (HOSPITAL_COMMUNITY): Payer: Self-pay | Admitting: Licensed Clinical Social Worker

## 2015-06-15 DIAGNOSIS — F333 Major depressive disorder, recurrent, severe with psychotic symptoms: Secondary | ICD-10-CM

## 2015-06-15 DIAGNOSIS — F1920 Other psychoactive substance dependence, uncomplicated: Secondary | ICD-10-CM | POA: Insufficient documentation

## 2015-06-15 DIAGNOSIS — F1410 Cocaine abuse, uncomplicated: Secondary | ICD-10-CM | POA: Insufficient documentation

## 2015-06-15 DIAGNOSIS — F332 Major depressive disorder, recurrent severe without psychotic features: Secondary | ICD-10-CM | POA: Insufficient documentation

## 2015-06-15 DIAGNOSIS — F1020 Alcohol dependence, uncomplicated: Secondary | ICD-10-CM

## 2015-06-15 DIAGNOSIS — I10 Essential (primary) hypertension: Secondary | ICD-10-CM | POA: Insufficient documentation

## 2015-06-15 DIAGNOSIS — E785 Hyperlipidemia, unspecified: Secondary | ICD-10-CM | POA: Insufficient documentation

## 2015-06-16 ENCOUNTER — Other Ambulatory Visit (HOSPITAL_COMMUNITY): Payer: 59

## 2015-06-16 ENCOUNTER — Encounter (HOSPITAL_COMMUNITY): Payer: Self-pay | Admitting: Psychology

## 2015-06-16 DIAGNOSIS — F1020 Alcohol dependence, uncomplicated: Secondary | ICD-10-CM | POA: Insufficient documentation

## 2015-06-16 NOTE — Progress Notes (Signed)
    Daily Group Progress Note  Program: CD-IOP   Group Time: 1-2;30 pm  Participation Level: Active  Behavioral Response: Appropriate and Sharing  Type of Therapy: Psycho-education Group  Topic: Psycho-Ed; Pharmacist: the first part of group was spent in a psycho-ed. The group had a visitor today in the form of a pharmacist from upstairs in the [facility]. She discussed many of the drugs used by patients upstairs and how they affected one's brain and body. The visitor also explained at length about medications that address depression, anxiety and other psychiatrist issues. The pharmacist also described the current medications that assist in reducing cravings, especially for alcohol and narcotics. She fielded questions from group members and provided an informative experience for all present.   Group Time: 2:45- 4pm  Participation Level: Active  Behavioral Response: Sharing  Type of Therapy: Process Group  Topic: Process: the second half of group was spent in process. Members shared what they had done to support their recovery, including attending 12-step meetings and discussions with their sponsors or others in recovery. Two new group members were present and they introduced themselves during the session. The program director met with a member to discuss progress as well as one of the new group members.    Summary: The patient was new to the group today and before the pharmacist arrived, he introduced himself. 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 He shared some of his realizations and admitted he must do this for himself instead of trying to please someone else. The patient reported he had attended an [place] or NA meeting every day since being discharged from detox upstairs at [facility]. The patient had a few questions for the pharmacist and agreed later that her  answers had been very informative.  In process, the patient provided good feedback and was very open and honest about his own character defects. The patient responded well to this first group session and his sobriety date is 12/9.    Family Program: Family present? No   Name of family member(s):   UDS collected: No Results:   AA/NA attended?: YesThursday, Friday, Saturday and Sunday  Sponsor?: No, but he seeking a new sponsor   EVANS,ANN, LCAS

## 2015-06-17 ENCOUNTER — Other Ambulatory Visit (HOSPITAL_COMMUNITY): Payer: 59 | Admitting: Licensed Clinical Social Worker

## 2015-06-17 ENCOUNTER — Encounter (HOSPITAL_COMMUNITY): Payer: Self-pay | Admitting: Medical

## 2015-06-17 ENCOUNTER — Other Ambulatory Visit (HOSPITAL_COMMUNITY): Payer: Self-pay | Admitting: Medical

## 2015-06-17 VITALS — BP 138/92 | HR 89 | Ht 68.0 in | Wt 238.4 lb

## 2015-06-17 DIAGNOSIS — F1120 Opioid dependence, uncomplicated: Secondary | ICD-10-CM

## 2015-06-17 DIAGNOSIS — E785 Hyperlipidemia, unspecified: Secondary | ICD-10-CM | POA: Diagnosis not present

## 2015-06-17 DIAGNOSIS — F1410 Cocaine abuse, uncomplicated: Secondary | ICD-10-CM | POA: Diagnosis not present

## 2015-06-17 DIAGNOSIS — M5020 Other cervical disc displacement, unspecified cervical region: Secondary | ICD-10-CM

## 2015-06-17 DIAGNOSIS — F332 Major depressive disorder, recurrent severe without psychotic features: Secondary | ICD-10-CM | POA: Diagnosis not present

## 2015-06-17 DIAGNOSIS — F1020 Alcohol dependence, uncomplicated: Secondary | ICD-10-CM

## 2015-06-17 DIAGNOSIS — M48062 Spinal stenosis, lumbar region with neurogenic claudication: Secondary | ICD-10-CM

## 2015-06-17 DIAGNOSIS — Z62811 Personal history of psychological abuse in childhood: Secondary | ICD-10-CM | POA: Insufficient documentation

## 2015-06-17 DIAGNOSIS — Z62810 Personal history of physical and sexual abuse in childhood: Secondary | ICD-10-CM

## 2015-06-17 DIAGNOSIS — Z811 Family history of alcohol abuse and dependence: Secondary | ICD-10-CM | POA: Insufficient documentation

## 2015-06-17 DIAGNOSIS — T6592XD Toxic effect of unspecified substance, intentional self-harm, subsequent encounter: Secondary | ICD-10-CM

## 2015-06-17 DIAGNOSIS — I10 Essential (primary) hypertension: Secondary | ICD-10-CM | POA: Diagnosis not present

## 2015-06-17 DIAGNOSIS — Z6372 Alcoholism and drug addiction in family: Secondary | ICD-10-CM | POA: Insufficient documentation

## 2015-06-17 DIAGNOSIS — F1920 Other psychoactive substance dependence, uncomplicated: Secondary | ICD-10-CM | POA: Diagnosis present

## 2015-06-17 MED ORDER — ATENOLOL 25 MG PO TABS: 25.0000 mg | ORAL_TABLET | Freq: Every day | ORAL | Status: AC

## 2015-06-17 NOTE — Progress Notes (Signed)
    Daily Group Progress Note  Program: CD-IOP   Group Time: 1-2:30  Participation Level: Active  Behavioral Response: Appropriate and Sharing  Type of Therapy: Process Group  Topic: The first part of group was spent in process where group members shared about obstacles, challenges and recovery-related activities since the last group. There was good disclosure and feedback among group members. Group members also shared any urges or cravings they had experienced. One patient was absent. Three patients saw PA Harold Hedge. Drug tests were taken. Drug test results were returned. Patients were led through a guided meditation to assist in stress relief for the holidays.   Group Time: 2:45-4  Participation Level: Active  Behavioral Response: Appropriate and Sharing  Type of Therapy: Psycho-education Group  Topic: The second part of group focused on 10 ways to avoid holiday relapse. Group members participated in a discussion on how to survive the holidays in early recovery. There was good discussion and suggestions from all group members. Group members were given a calendar to develop a plan for recovery for the next 7 days, during the holidays. Group members were open and honest during the intervention, identifying upcoming high risk situations, how to move past a craving and keeping stress under control.     Summary: Patient is new to the program. This is his 2nd session. He met with the PA so was gone from the group during part of the second half. He is adjusting well to the program and has good suggestions and feedback to other group members. He appears honest in what he shares about his recovery. Patient is going to 2-3 meetings per day. He reports he is serious about his recovery. Patient has already found a temporary sponsor. Patient reported his holiday will be a low-risk situation on Christmas Eve. However, on Christmas day that may be high-risk for him. He and his wife have already worked  out how they will deal with the patient's cravings or thoughts to drink. He has a plan. He reports he has had no cravings. He responded well to the intervention. His sobriety date is 12/9.   Family Program: Family present? No   Name of family member(s):   UDS collected: Yes Results:  AA/NA attended?: YesMonday, Tuesday and Wednesday  Sponsor?: Yes   MACKENZIE,LISBETH S, Licensed Cli

## 2015-06-17 NOTE — Progress Notes (Signed)
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                          Consequences of Substance Abuse: Medical Consequences:  Overdose/Suicidal/?accidents Legal Consequences:  DUI 1991;Man[employer] THC 1991 Family Consequences:  Marital discord Blackouts:   DT's: Withdrawal Symptoms:   Diaphoresis Headaches Nausea Tremors Vomiting  Medical Decision Making:  Established Problem, Stable/Improving (1), Review or order clinical lab tests (1), Review and summation of old records (2), Review of Medication Regimen & Side Effects (2) and Review of New Medication or Change in Dosage (2)  [facility]UDS per protocol;Meds per Discharge-rx for Atenolol refill done;Try CBT for racing thoughts-rubberband technique FU next week     ChDarlyne Russian2/21/20163:31 PM

## 2015-06-18 ENCOUNTER — Other Ambulatory Visit (HOSPITAL_COMMUNITY): Payer: 59 | Admitting: Psychology

## 2015-06-18 DIAGNOSIS — F1020 Alcohol dependence, uncomplicated: Secondary | ICD-10-CM

## 2015-06-19 ENCOUNTER — Other Ambulatory Visit (HOSPITAL_COMMUNITY): Payer: 59

## 2015-06-19 ENCOUNTER — Encounter (HOSPITAL_COMMUNITY): Payer: Self-pay | Admitting: Psychology

## 2015-06-19 LAB — PRESCRIPTION ABUSE MONITORING 17P, URINE
6-Acetylmorphine, Urine: NEGATIVE ng/mL
Amphetamine Scrn, Ur: NEGATIVE ng/mL
BARBITURATE SCREEN URINE: NEGATIVE ng/mL
BENZODIAZEPINE SCREEN, URINE: NEGATIVE ng/mL
Buprenorphine, Urine: NEGATIVE ng/mL
CANNABINOIDS UR QL SCN: NEGATIVE ng/mL
Carisoprodol/Meprobamate, Ur: NEGATIVE ng/mL
Cocaine (Metab) Scrn, Ur: NEGATIVE ng/mL
Creatinine(Crt), U: 159.7 mg/dL (ref 20.0–300.0)
EDDP, Urine: NEGATIVE ng/mL
Fentanyl, Urine: NEGATIVE pg/mL
MDMA Screen, Urine: NEGATIVE ng/mL
Meperidine Screen, Urine: NEGATIVE ng/mL
Methadone Screen, Urine: NEGATIVE ng/mL
Nitrite Urine, Quantitative: NEGATIVE ug/mL
OXYCODONE+OXYMORPHONE UR QL SCN: NEGATIVE ng/mL
Opiate Scrn, Ur: NEGATIVE ng/mL
Ph of Urine: 5.5 (ref 4.5–8.9)
Phencyclidine Qn, Ur: NEGATIVE ng/mL
Propoxyphene Scrn, Ur: NEGATIVE ng/mL
SPECIFIC GRAVITY: 1.0253
Tapentadol, Urine: NEGATIVE ng/mL
Tramadol Screen, Urine: NEGATIVE ng/mL

## 2015-06-19 LAB — ETHYL GLUCURONIDE, URINE: Ethyl Glucuronide Screen, Ur: NEGATIVE ng/mL

## 2015-06-19 NOTE — Progress Notes (Signed)
    Daily Group Progress Note  Program: CD-IOP   Group Time: 1-2:30 pm  Participation Level: Active  Behavioral Response: Sharing  Type of Therapy: Process Group  Topic: Process: the first half of group was spent in process. Members discussed what they had done since we last met to support their recovery.  A new member was present today and she was asked to introduce herself to the group. She described a long history of addiction and her disclosures invited feedback and other members revealed more about their own use.   Group Time: 2:45- 4pm  Participation Level: Active  Behavioral Response: Appropriate  Type of Therapy: Psycho-education Group  Topic: Psycho-Ed: "Serenity Prayer"/Graduation: the second half of group was spent in a psycho-ed". Clips from the HBO special on "Addiction" were shown. They included addicts talking about their addiction and Medical Specialists; (including the director of [employer]) describing what goes on in the brain of the addict. MRI's and Pet Scans displayed the brain changes that occur due to drug use. This was followed by a handout provided on the Serenity Prayer. It asked members to identify 5 things they could and could not change. Members shared what they had identified. As the session neared the end, a graduation ceremony was held honoring a member who was completing the program today. Kind words were shared by all as the graduation medallion was passed around.    Summary: The patient reported he had attended the [place] meeting at the [employer] yesterday evening. He also secured a new sponsor. He reported that he had had a very honest conversation with his wife last night and he went on to share with the group how he had spent money that she was not aware of. The patient admitted that he had done some really bad things and his wife was very confused and angry with him. He provided some good feedback to the new group member. During the presentation on the  [place], the patient reported that he can change his feelings, but admitted that "I can't change other people's feelings". The patient had some kind words for the graduating member and wished her well. He made some good comments and presents in a very open way about the many things he has done while in his active addiction. The patient responded well to this intervention and his sobriety date remains 12/9.    Family Program: Family present? No   Name of family member(s):   UDS collected: No Results:   AA/NA attended?: YesWednesday  Sponsor?: Yes, he just secured a new sponsor   EVANS,ANN, LCAS
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
## 2015-06-23 ENCOUNTER — Other Ambulatory Visit (HOSPITAL_COMMUNITY): Payer: 59

## 2015-06-24 ENCOUNTER — Other Ambulatory Visit (HOSPITAL_COMMUNITY): Payer: Self-pay | Admitting: Medical

## 2015-06-24 ENCOUNTER — Other Ambulatory Visit (HOSPITAL_COMMUNITY): Payer: 59 | Admitting: Psychology

## 2015-06-24 DIAGNOSIS — F1020 Alcohol dependence, uncomplicated: Secondary | ICD-10-CM | POA: Diagnosis not present

## 2015-06-25 ENCOUNTER — Other Ambulatory Visit (HOSPITAL_COMMUNITY): Payer: 59 | Admitting: Psychology

## 2015-06-25 DIAGNOSIS — F1020 Alcohol dependence, uncomplicated: Secondary | ICD-10-CM | POA: Diagnosis not present

## 2015-06-26 ENCOUNTER — Other Ambulatory Visit (HOSPITAL_COMMUNITY): Payer: 59

## 2015-06-26 LAB — PRESCRIPTION ABUSE MONITORING 17P, URINE
6-Acetylmorphine, Urine: NEGATIVE ng/mL
Amphetamine Scrn, Ur: NEGATIVE ng/mL
BARBITURATE SCREEN URINE: NEGATIVE ng/mL
BENZODIAZEPINE SCREEN, URINE: NEGATIVE ng/mL
Buprenorphine, Urine: NEGATIVE ng/mL
CANNABINOIDS UR QL SCN: NEGATIVE ng/mL
Carisoprodol/Meprobamate, Ur: NEGATIVE ng/mL
Cocaine (Metab) Scrn, Ur: NEGATIVE ng/mL
Creatinine(Crt), U: 153 mg/dL (ref 20.0–300.0)
EDDP, Urine: NEGATIVE ng/mL
Fentanyl, Urine: NEGATIVE pg/mL
MDMA Screen, Urine: NEGATIVE ng/mL
Meperidine Screen, Urine: NEGATIVE ng/mL
Methadone Screen, Urine: NEGATIVE ng/mL
Nitrite Urine, Quantitative: NEGATIVE ug/mL
OXYCODONE+OXYMORPHONE UR QL SCN: NEGATIVE ng/mL
Opiate Scrn, Ur: NEGATIVE ng/mL
Ph of Urine: 5.4 (ref 4.5–8.9)
Phencyclidine Qn, Ur: NEGATIVE ng/mL
Propoxyphene Scrn, Ur: NEGATIVE ng/mL
SPECIFIC GRAVITY: 1.028
Tapentadol, Urine: NEGATIVE ng/mL
Tramadol Screen, Urine: NEGATIVE ng/mL

## 2015-06-26 LAB — ETHYL GLUCURONIDE, URINE: Ethyl Glucuronide Screen, Ur: NEGATIVE ng/mL

## 2015-06-30 ENCOUNTER — Other Ambulatory Visit (HOSPITAL_COMMUNITY): Payer: 59 | Attending: Medical

## 2015-06-30 ENCOUNTER — Encounter (HOSPITAL_COMMUNITY): Payer: Self-pay | Admitting: Psychology

## 2015-06-30 NOTE — Progress Notes (Signed)
    Daily Group Progress Note  Program: CD-IOP   Group Time: 1-2:30 pm  Participation Level: Active  Behavioral Response: Sharing, Rigid and Blaming  Type of Therapy: Process Group  Topic: Process: The first part of group was spent in process. Members identified what they had done to support their recovery since the last time we met. They were also asked to share any thoughts, challenges or temptations that may have presented themselves. A group member who had missed group yesterday because of illness, returned to today. He shared about his holiday weekend and described how he had returned to using cannabis. He admitted it was so natural and spontaneous that he hadn't even thought twice. The patient's disclosure invited others to comment and share about their own experiences. Both this patient and another new group member expressed a growing hesitation (denial) around committing to an abstinence-based lifestyle. A drug test was collected from the member absent yesterday.   Group Time: 2:45-4pm  Participation Level: Active  Behavioral Response: Sharing  Type of Therapy: Psycho-education Group  Topic: Psycho-Ed: Relapse Process/Bio-Psycho-Social-Spiritual: The second half of group was spent in a psycho-ed. Members were introduced to the 4-step relapse process. The importance of 'choice' whether one opts to disclose any using thoughts that may have been triggered on any typical day. Group members were also asked to identify an internal and external trigger and shared whether they had had any thoughts about using over the last few days. A review followed on the 4 elements that combine to produce chemical dependency. The Bio-Psycho-Social-Spiritual elements were discussed at length. There was good feedback and discussion among members. Prior to the conclusion of the session, members shared their plans for the upcoming holiday weekend.   Summary: The patient reported he was very frustrated and  had had a bad time since our last session. He complained about having dinner with his grandchildren at a restaurant and their behaviors and seemed to find fault with everyone, but himself. The grandchildren are 2 and 47 yo. He complained about his wife and her enabling behaviors with their grandchildren and his wife's "mouth". When asked about his marriage, the patient reported he had been married 13 years, but he also admitted he wasn't sure that it would last much longer. When he was questioned about this, the patient became defensive and seemed unwilling to see his 'part' in this drama. The patient continues to speak in 'AA clichs' and other members appear put off by his 'suggestions'. In the psycho-ed, the patient identified "physical pain' as his internal trigger while "the beer aisle" is an external trigger. During the bio-psycho-social-spiritual, the patient quickly pointed out his genetic history with father and grandfather dying young from alcoholism as well as s mental health issues elsewhere. The patient has much work to do, but he keeps coming back. We will continue to follow closely in the days ahead. His sobriety date remains 12/9.   Family Program: Family present? No   Name of family member(s):  UDS collected: No Results:   AA/NA attended?: YesWednesday  Sponsor?: Yes   EVANS,ANN, LCAS

## 2015-07-01 ENCOUNTER — Encounter (HOSPITAL_COMMUNITY): Payer: Self-pay | Admitting: Psychology

## 2015-07-01 ENCOUNTER — Other Ambulatory Visit (HOSPITAL_COMMUNITY): Payer: 59

## 2015-07-01 ENCOUNTER — Telehealth (HOSPITAL_COMMUNITY): Payer: Self-pay | Admitting: Psychology

## 2015-07-01 NOTE — Progress Notes (Signed)
Oswaldo MilianJason H Guglielmo is a 45 y.o. male patient. CD-IOP: Excused absence. The patient had dental surgery this morning and did not appear for group today. He will be excused from the session, but he is expected back for tomorrow's group.         EVANS,ANN, LCAS

## 2015-07-01 NOTE — Progress Notes (Signed)
    Daily Group Progress Note  Program: CD-IOP   Group Time: 1-2:30 pm  Participation Level: Active  Behavioral Response: Sharing, Rationalizing and Blaming  Type of Therapy: Process Group  Topic: Process: The first part of group was spent in a process session. Members shared what they had done since we last met 6 days ago. Christmas was over, but this has proven to be a difficult and challenging time for many in early recovery. There was good sharing among group members and drug tests were collected from everyone present. Even the newest member reported she had stayed sober on her trip to [place] despite heavy use around her. She received good feedback and support from her fellow group members.   Group Time: 2:45- 4pm  Participation Level: Active  Behavioral Response: Sharing  Type of Therapy: Psycho-education Group  Topic: Psycho-Ed: Chaplain and Mindfulness. The second half of group was spent in a psycho-ed with a visiting Chaplain. After completing introductions, he led an exercise with a "Hershey's Chocolate Kiss". Members were instructed to eat the chocolate piece as slowly as possible and really think about the experience. There was good feedback among members at the completion of the exercise and the remainder of the group focused on the importance of being present. The program director met with the newest member today during the session.    Summary: The patient reported he had gotten through the holiday. He admitted that he had gotten upset and threw a 'temper tantrum' on Saturday when 'things weren't going my way'.  When asked to identify what he had been upset about, the patient reported, "I'm tired, my body hurts and everything aches". He admitted he wanted to get high and was very frustrated. He couldn't get into the pantry to put some recycling into the bag and ended up practically pulling the door off the hinges. He downplayed the significance of this little tantrum. The patient  admitted that the biggest urges to use came on Monday and he admitted, "I wanted to get high and drink". Instead, he phoned his sponsor and went to a meeting. The patient spoke highly about the NA meeting he attends called '[employer]' and encouraged others to try it out. He wanted to meet with the program director today, but when I asked why, the patient reported he has a sinus infection. I explained that he would not be provided any meds for issues beyond what he is being treatment here for. During the psycho-ed, the patient laughed and agreed with another member that he had never eaten a piece of chocolate so slowly and probably wouldn't ever again! The patient articulated that he understood the importance of being present and repeated some of the AA cliches that he tosses out so easily. There was little gratitude or humility displayed by this patient today and we will address this in future session.    Family Program: Family present? No   Name of family member(s):   UDS collected: Yes Results: negative  AA/NA attended?: YesMonday, Tuesday, Thursday, Friday and Saturday  Sponsor?: Yes   EVANS,ANN, LCAS

## 2015-07-02 ENCOUNTER — Other Ambulatory Visit (HOSPITAL_COMMUNITY): Payer: 59

## 2015-07-02 ENCOUNTER — Telehealth (HOSPITAL_COMMUNITY): Payer: Self-pay | Admitting: Licensed Clinical Social Worker

## 2015-07-02 ENCOUNTER — Encounter (HOSPITAL_COMMUNITY): Payer: Self-pay | Admitting: Psychology

## 2015-07-03 ENCOUNTER — Other Ambulatory Visit (HOSPITAL_COMMUNITY): Payer: 59

## 2015-07-03 ENCOUNTER — Telehealth (HOSPITAL_COMMUNITY): Payer: Self-pay | Admitting: Psychology

## 2015-07-06 ENCOUNTER — Other Ambulatory Visit (HOSPITAL_COMMUNITY): Payer: 59

## 2015-07-07 ENCOUNTER — Other Ambulatory Visit (HOSPITAL_COMMUNITY): Payer: 59

## 2015-07-07 ENCOUNTER — Telehealth (HOSPITAL_COMMUNITY): Payer: Self-pay | Admitting: Licensed Clinical Social Worker

## 2015-07-07 NOTE — Progress Notes (Signed)
[name] is a 45 y.o. male patient. CD-IOP: The patient did not appear for group today nor did he phone to explain his absence. He had missed group yesterday due to a dental procedure and having at least one teeth pulled, but he was excused for yesterday's absence. We would have thought he would be back in group today. Will wait to hear from him. This absence is unexcused.         EVANS,ANN, LCAS

## 2015-07-08 ENCOUNTER — Other Ambulatory Visit (HOSPITAL_COMMUNITY): Payer: 59

## 2015-07-09 ENCOUNTER — Encounter (HOSPITAL_COMMUNITY): Payer: Self-pay | Admitting: Psychology

## 2015-07-09 ENCOUNTER — Other Ambulatory Visit (HOSPITAL_COMMUNITY): Payer: 59

## 2015-07-10 ENCOUNTER — Other Ambulatory Visit (HOSPITAL_COMMUNITY): Payer: 59

## 2015-07-13 ENCOUNTER — Other Ambulatory Visit (HOSPITAL_COMMUNITY): Payer: 59

## 2015-07-14 ENCOUNTER — Other Ambulatory Visit (HOSPITAL_COMMUNITY): Payer: 59

## 2015-07-15 ENCOUNTER — Other Ambulatory Visit (HOSPITAL_COMMUNITY): Payer: 59 | Admitting: Medical

## 2015-07-15 ENCOUNTER — Encounter (HOSPITAL_COMMUNITY): Payer: Self-pay | Admitting: Medical

## 2015-07-15 NOTE — Progress Notes (Signed)
STEED KANAAN is a 45 y.o. male patient. CD-IOP: Unsuccessful. Patient is discharged from CD-IOP unsuccessful. He has not returned to group and has not responded to repeated phone calls inquiring about his well-being. Phone calls were also placed to the patient's wife, but she has failed to return our calls as well. Discharge - unsuccessful.         EVANS,ANN, LCAS

## 2015-07-15 NOTE — Progress Notes (Signed)
  [facility] Chemical Dependency Intensive Outpatient Discharge Summary   [name] [id number]  Date of Admission: 06/15/2015 Date of Discharge: 07/09/2015  Course of Treatment:  The patient did not appear for group today nor did he phone to explain his absence. He had missed group yesterday due to a dental procedure and having at least one teeth pulled, but he was excused for yesterday's absence. We would have thought he would be back in group today. Will wait to hear from him. This absence is unexcused.    07/07/2015 03:36 PM Phone (Outgoing) Plog,[name] (Emergency Contact)    Left Message-  Left msg on vm. Asking her to call me back due to my concern about pt missing 2 groups last week and not returning call.s  07/09/2015 .He has not returned to group and has not responded to repeated phone calls inquiring about his well-being. Phone calls were also placed to the patient's wife, but she has failed to return our calls as well. Discharge - unsuccessful.   Patient is discharged from CD-IOP unsuccessful  Goals and Activities to Help Maintain Sobriety: 1. Stay away from old friends who continue to drink and use mind-altering chemicals. 2. Continue practicing Fair Fighting rules in interpersonal conflicts. 3. Continue alcohol and drug refusal skills and call on support system  Referrals: AA/Hospital Discharge recommendations other than CD IOP  Aftercare services:  1. Attend AA/NA meetings  7  times per week. 2. Obtain a sponsor and a home group in AA/NA. 3. Return to Psychotherapist: PRN  Next appointment: Per pt   Plan of Action to Address Continuing Problems: Would recommend long term residential treatment for this gentleman    Client has NOT participated in the development of this discharge plan and has NOT received a copy of this completed plan  [name]  07/15/2015   [name], PA-C 07/15/2015

## 2015-07-16 ENCOUNTER — Other Ambulatory Visit (HOSPITAL_COMMUNITY): Payer: 59

## 2015-07-17 ENCOUNTER — Other Ambulatory Visit (HOSPITAL_COMMUNITY): Payer: 59

## 2015-07-20 ENCOUNTER — Other Ambulatory Visit (HOSPITAL_COMMUNITY): Payer: 59

## 2015-07-21 ENCOUNTER — Other Ambulatory Visit (HOSPITAL_COMMUNITY): Payer: 59

## 2015-07-22 ENCOUNTER — Other Ambulatory Visit (HOSPITAL_COMMUNITY): Payer: 59

## 2015-07-23 ENCOUNTER — Other Ambulatory Visit (HOSPITAL_COMMUNITY): Payer: 59

## 2015-07-24 ENCOUNTER — Other Ambulatory Visit (HOSPITAL_COMMUNITY): Payer: 59

## 2015-07-27 ENCOUNTER — Other Ambulatory Visit (HOSPITAL_COMMUNITY): Payer: 59

## 2015-07-28 ENCOUNTER — Other Ambulatory Visit (HOSPITAL_COMMUNITY): Payer: 59

## 2015-07-29 ENCOUNTER — Other Ambulatory Visit (HOSPITAL_COMMUNITY): Payer: 59

## 2015-07-30 ENCOUNTER — Other Ambulatory Visit (HOSPITAL_COMMUNITY): Payer: 59

## 2015-07-31 ENCOUNTER — Other Ambulatory Visit (HOSPITAL_COMMUNITY): Payer: 59

## 2015-08-03 ENCOUNTER — Other Ambulatory Visit (HOSPITAL_COMMUNITY): Payer: 59

## 2015-08-04 ENCOUNTER — Other Ambulatory Visit (HOSPITAL_COMMUNITY): Payer: 59

## 2015-11-10 ENCOUNTER — Emergency Department (HOSPITAL_COMMUNITY)
Admission: EM | Admit: 2015-11-10 | Discharge: 2015-11-10 | Disposition: A | Discharge status: Home / Self Care | Payer: 59 | Attending: Emergency Medicine | Admitting: Emergency Medicine

## 2015-11-10 ENCOUNTER — Encounter (HOSPITAL_COMMUNITY): Payer: Self-pay

## 2015-11-10 ENCOUNTER — Emergency Department (HOSPITAL_COMMUNITY): Payer: 59

## 2015-11-10 DIAGNOSIS — I10 Essential (primary) hypertension: Secondary | ICD-10-CM | POA: Insufficient documentation

## 2015-11-10 DIAGNOSIS — M1990 Unspecified osteoarthritis, unspecified site: Secondary | ICD-10-CM | POA: Diagnosis not present

## 2015-11-10 DIAGNOSIS — Z79899 Other long term (current) drug therapy: Secondary | ICD-10-CM | POA: Insufficient documentation

## 2015-11-10 DIAGNOSIS — F329 Major depressive disorder, single episode, unspecified: Secondary | ICD-10-CM | POA: Insufficient documentation

## 2015-11-10 DIAGNOSIS — Z791 Long term (current) use of non-steroidal anti-inflammatories (NSAID): Secondary | ICD-10-CM | POA: Diagnosis not present

## 2015-11-10 DIAGNOSIS — R1013 Epigastric pain: Secondary | ICD-10-CM | POA: Insufficient documentation

## 2015-11-10 DIAGNOSIS — R079 Chest pain, unspecified: Secondary | ICD-10-CM | POA: Diagnosis present

## 2015-11-10 LAB — CBC
HCT: 40.3 % (ref 39.0–52.0)
Hemoglobin: 14 g/dL (ref 13.0–17.0)
MCH: 30.9 pg (ref 26.0–34.0)
MCHC: 34.7 g/dL (ref 30.0–36.0)
MCV: 89 fL (ref 78.0–100.0)
Platelets: 166 10*3/uL (ref 150–400)
RBC: 4.53 MIL/uL (ref 4.22–5.81)
RDW: 14.6 % (ref 11.5–15.5)
WBC: 6.1 10*3/uL (ref 4.0–10.5)

## 2015-11-10 LAB — HEPATIC FUNCTION PANEL
ALT: 31 U/L (ref 17–63)
AST: 25 U/L (ref 15–41)
Albumin: 5.1 g/dL — ABNORMAL HIGH (ref 3.5–5.0)
Alkaline Phosphatase: 89 U/L (ref 38–126)
Bilirubin, Direct: <0.1 mg/dL — ABNORMAL LOW (ref 0.1–0.5)
Total Bilirubin: 0.8 mg/dL (ref 0.3–1.2)
Total Protein: 8 g/dL (ref 6.5–8.1)

## 2015-11-10 LAB — BASIC METABOLIC PANEL
Anion gap: 10 (ref 5–15)
BUN: 21 mg/dL — ABNORMAL HIGH (ref 6–20)
CO2: 25 mmol/L (ref 22–32)
Calcium: 9.8 mg/dL (ref 8.9–10.3)
Chloride: 103 mmol/L (ref 101–111)
Creatinine, Ser: 1.13 mg/dL (ref 0.61–1.24)
GFR calc Af Amer: >60 mL/min (ref >60)
GFR calc non Af Amer: >60 mL/min (ref >60)
Glucose, Bld: 107 mg/dL — ABNORMAL HIGH (ref 65–99)
Potassium: 4.4 mmol/L (ref 3.5–5.1)
Sodium: 138 mmol/L (ref 135–145)

## 2015-11-10 LAB — I-STAT TROPONIN, ED: Troponin i, poc: 0 ng/mL (ref 0.00–0.08)

## 2015-11-10 LAB — TROPONIN I: Troponin I: <0.03 ng/mL (ref <0.031)

## 2015-11-10 LAB — LIPASE, BLOOD: Lipase: 21 U/L (ref 11–51)

## 2015-11-10 MED ORDER — OXYCODONE-ACETAMINOPHEN 5-325 MG PO TABS: 1.0000 | ORAL_TABLET | ORAL | Status: DC | PRN

## 2015-11-10 MED ORDER — GI COCKTAIL ~~LOC~~
30.0000 mL | Freq: Once | ORAL | Status: AC
  Administered 2015-11-10: 30 mL via ORAL
  Filled 2015-11-10: qty 30

## 2015-11-10 MED ORDER — DIATRIZOATE MEGLUMINE & SODIUM 66-10 % PO SOLN: 15.0000 mL | ORAL | Status: DC | PRN

## 2015-11-10 MED ORDER — HYDROMORPHONE HCL 1 MG/ML IJ SOLN
1.0000 mg | Freq: Once | INTRAMUSCULAR | Status: AC
  Administered 2015-11-10: 1 mg via INTRAVENOUS
  Filled 2015-11-10: qty 1

## 2015-11-10 MED ORDER — IOPAMIDOL (ISOVUE-300) INJECTION 61%
100.0000 mL | Freq: Once | INTRAVENOUS | Status: AC | PRN
  Administered 2015-11-10: 100 mL via INTRAVENOUS

## 2015-11-10 MED ORDER — FENTANYL CITRATE (PF) 100 MCG/2ML IJ SOLN
100.0000 ug | Freq: Once | INTRAMUSCULAR | Status: AC
  Administered 2015-11-10: 100 ug via INTRAVENOUS
  Filled 2015-11-10: qty 2

## 2015-11-10 MED ORDER — RANITIDINE HCL 150 MG/10ML PO SYRP
150.0000 mg | ORAL_SOLUTION | Freq: Once | ORAL | Status: AC
  Administered 2015-11-10: 150 mg via ORAL
  Filled 2015-11-10: qty 10

## 2015-11-10 MED ORDER — PANTOPRAZOLE SODIUM 20 MG PO TBEC: 20.0000 mg | DELAYED_RELEASE_TABLET | Freq: Two times a day (BID) | ORAL | Status: DC

## 2015-11-10 MED ORDER — SODIUM CHLORIDE 0.9 % IV BOLUS (SEPSIS)
1000.0000 mL | Freq: Once | INTRAVENOUS | Status: AC
  Administered 2015-11-10: 1000 mL via INTRAVENOUS

## 2015-11-10 MED ORDER — MORPHINE SULFATE (PF) 4 MG/ML IV SOLN: 6.0000 mg | Freq: Once | INTRAVENOUS | Status: DC

## 2015-11-10 NOTE — ED Notes (Signed)
Informed Dr. Clayborne DanaMesner of patients increased pain.

## 2015-11-10 NOTE — ED Notes (Signed)
Patient transported to CT 

## 2015-11-10 NOTE — ED Provider Notes (Signed)
Assumed care in signout with ultrasound pending. Unremarkable. Findings were discussed with patient. He reports still having pain although somewhat improved. Presumptively placed on PPI for possible PUD. Recommended following up with his PCP and/or gastroenterology. Return precautions were discussed. Questions answered.  Raeford RazorStephen Kohut, MD 11/10/15 2039

## 2015-11-10 NOTE — Discharge Instructions (Signed)

## 2015-11-10 NOTE — ED Notes (Signed)
Pt presents with c/o chest pain in the center of his chest, sharp in nature, that started yesterday. Pt reports that the pain also radiates to the center of his back as well with some associated shortness of breath and dizziness. Pt in no acute distress at this time, talking in complete sentences.

## 2015-11-10 NOTE — ED Notes (Signed)
Pt requesting more pain medication. Informed Dr. Clayborne DanaMesner. He reports he will come talk to patient.

## 2015-11-10 NOTE — ED Provider Notes (Signed)
CSN: [id number]     Arrival date & time 11/10/15  1328 History   First MD Initiated Contact with Patient 11/10/15 1454     Chief Complaint  Patient presents with  . Chest Pain     (Consider location/radiation/quality/duration/timing/severity/associated sxs/prior Treatment) Patient is a 45 y.o. male presenting with abdominal pain.  Abdominal Pain Pain location:  Epigastric Pain quality: aching and sharp   Pain radiates to:  Back Pain severity:  Mild Onset quality:  Gradual Timing:  Constant Progression:  Worsening Chronicity:  New Context: eating (approximately 30 minutes after eating)   Context: not alcohol use, not medication withdrawal, not retching and not sick contacts   Worsened by:  Eating Associated symptoms: no vomiting     Past Medical History  Diagnosis Date  . Hypertension   . Arthritis   . Anxiety   . Depression   . 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 Polysubstance abuse    Past Surgical History  Procedure Laterality Date  . Back surgery      5 back surgeries  . Spinal cord stimulator implant    . Nasal sinus surgery    . Lumbar fusion Left 06/13/2013    Procedure: SACROILIAC JOINT FUSION,LEFT;  Surgeon: Reinaldo Meeker, MD;  Location: [facility];  Service: Neurosurgery;  Laterality: Left;  left  . Anterior cervical decomp/discectomy fusion N/A 12/18/2014    Procedure: ANTERIOR CERVICAL DECOMPRESSION/DISCECTOMY FUSION CERVICAL FIVE-SIX;  Surgeon: Aliene Beams, MD;  Location: [facility];  Service: Neurosurgery;  Laterality: N/A;  ANTERIOR CERVICAL DECOMPRESSION/DISCECTOMY FUSION CERVICAL FIVE-SIX   Family History  Problem Relation Age of Onset  . Alcohol abuse Father   . Bipolar disorder Father   . Alcohol abuse Maternal Grandfather   . Alcohol abuse Paternal Grandfather   . Alcohol abuse Cousin    Social History  Substance Use Topics  . Smoking status: Never Smoker   . Smokeless tobacco: None  . Alcohol Use: 36.0 oz/week    60 Shots of liquor per week     Review of Systems  Eyes: Negative for pain.  Gastrointestinal: Positive for abdominal pain. Negative for vomiting.  All other systems reviewed and are negative.     Allergies  Lisinopril; Diclofenac; and Penicillins  Home Medications   Prior to Admission medications   Medication Sig Start Date End Date Taking? Authorizing Provider  acetaminophen (TYLENOL) 500 MG tablet Take 1,000 mg by mouth every 6 (six) hours as needed for moderate pain.   Yes Historical Provider, MD  amLODipine (NORVASC) 10 MG tablet Take 10 mg by mouth daily.   Yes Historical Provider, MD  atenolol (TENORMIN) 25 MG tablet Take 1 tablet (25 mg total) by mouth daily. 06/17/15  Yes Court Joy, PA-C  fexofenadine (ALLEGRA) 180 MG tablet Take 180 mg by mouth every evening.   Yes Historical Provider, MD  gabapentin (NEURONTIN) 600 MG tablet Take 600 mg by mouth 3 (three) times daily.   Yes Historical Provider, MD  hydrochlorothiazide (HYDRODIURIL) 25 MG tablet Take 25 mg by mouth daily.  06/05/15  Yes Historical Provider, MD  ibuprofen (ADVIL,MOTRIN) 200 MG tablet Take 600 mg by mouth every 6 (six) hours as needed for moderate pain.   Yes Historical Provider, MD  Naltrexone-Bupropion HCl ER (CONTRAVE) 8-90 MG TB12 Take 1 tablet by mouth 2 (two) times daily.   Yes Historical Provider, MD  tiZANidine (ZANAFLEX) 4 MG tablet Take 4 mg by mouth at bedtime.   Yes Historical Provider, MD  traZODone (DESYREL)  100 MG tablet Take 100 mg by mouth at bedtime.   Yes Historical Provider, MD  acamprosate (CAMPRAL) 333 MG tablet Take 2 tablets (666 mg total) by mouth 3 (three) times daily with meals. Patient not taking: Reported on 11/10/2015 06/11/15   Adonis Brook, NP  DULoxetine (CYMBALTA) 30 MG capsule Take 1 capsule (30 mg total) by mouth daily. Patient not taking: Reported on 11/10/2015 06/11/15   Oneta Rack, NP  folic acid (FOLVITE) 1 MG tablet Take 1 tablet (1 mg total) by mouth daily. Patient not taking: Reported  on 06/16/2015 06/07/15   Rolly Salter, MD  gabapentin (NEURONTIN) 300 MG capsule Take 2 capsules (600 mg total) by mouth 3 (three) times daily. Patient not taking: Reported on 11/10/2015 06/11/15   Oneta Rack, NP  gemfibrozil (LOPID) 600 MG tablet Take 1 tablet (600 mg total) by mouth daily. Patient not taking: Reported on 06/30/2015 06/11/15   Oneta Rack, NP  hydrOXYzine (ATARAX/VISTARIL) 25 MG tablet Take 1 tablet (25 mg total) by mouth every 8 (eight) hours as needed for anxiety (Sleep). Patient not taking: Reported on 11/10/2015 06/11/15   Oneta Rack, NP  loratadine (CLARITIN) 10 MG tablet Take 1 tablet (10 mg total) by mouth daily. Patient not taking: Reported on 11/10/2015 06/11/15   Oneta Rack, NP  QUEtiapine (SEROQUEL) 200 MG tablet Take 1 tablet (200 mg total) by mouth at bedtime. Patient not taking: Reported on 11/10/2015 06/11/15   Oneta Rack, NP  thiamine 100 MG tablet Take 1 tablet (100 mg total) by mouth daily. Patient not taking: Reported on 06/30/2015 06/07/15   Rolly Salter, MD   BP 130/80 mmHg  Pulse 79  Temp(Src) 98.3 F (36.8 C) (Oral)  Resp 18  SpO2 98% Physical Exam  Constitutional: He appears well-developed and well-nourished.  HENT:  Head: Normocephalic and atraumatic.  Neck: Normal range of motion.  Cardiovascular: Normal rate.   Pulmonary/Chest: Effort normal. No respiratory distress.  Abdominal: Soft. He exhibits no distension. There is tenderness (epigastric area).  Musculoskeletal: Normal range of motion.  Neurological: He is alert.  Nursing note and vitals reviewed.   ED Course  Procedures (including critical care time) Labs Review Labs Reviewed  BASIC METABOLIC PANEL - Abnormal; Notable for the following:    Glucose, Bld 107 (*)    BUN 21 (*)    All other components within normal limits  HEPATIC FUNCTION PANEL - Abnormal; Notable for the following:    Albumin 5.1 (*)    Bilirubin, Direct <0.1 (*)    All other components within  normal limits  CBC  LIPASE, BLOOD  TROPONIN I  Rosezena Sensor, ED    Imaging Review Dg Chest 2 View  11/10/2015  CLINICAL DATA:  Mid chest pain and shortness of breath since yesterday. EXAM: CHEST  2 VIEW COMPARISON:  June 11, 2013 FINDINGS: The heart size and mediastinal contours are stable. Spinal stimulator leads are noted. Both lungs are clear. The visualized skeletal structures are stable. No acute abnormalities identified. Patient status post prior fixation of lower cervical spine. IMPRESSION: No active cardiopulmonary disease. Electronically Signed   By: Sherian Rein M.D.   On: 11/10/2015 14:07   I have personally reviewed and evaluated these images and lab results as part of my medical decision-making.   EKG Interpretation   Date/Time:  Tuesday Nov 10 2015 13:38:07 EDT Ventricular Rate:  77 PR Interval:  150 QRS Duration: 94 QT Interval:  362 QTC Calculation:  410 R Axis:   35 Text Interpretation:  Sinus rhythm Borderline T abnormalities, inferior  leads similar to multiple previous ecgs Confirmed by [facility] MD, [name]  [phone]) on 11/10/2015 3:42:41 PM      MDM   Final diagnoses:  None    Epigastric pain after eating. Concern for cholecystitis v PUD v gastritis v pancreatitis. Less likely aortic pathology or mesenteric abnormality. Low likelihood for ACS, delta troponins negative. Will start with labs, ct, supportive/symptomatic care.   Ct and labs ok. Still with pain. redosed pain meds but patient appears well without any e/o sympathetic response to pain. Will get US and likely dc on short course of pain meds and PPI with GI/PCP follow up if ok.  Care transferred pending US.     Marily MemosJason Mesner, MD 11/11/15 [phone]

## 2016-01-19 ENCOUNTER — Ambulatory Visit: Payer: Self-pay | Admitting: Surgery

## 2016-01-19 NOTE — H&P (Signed)
[name] 01/19/2016 9:30 AM Location: [facility] Patient #: [id number] DOB: 1970-12-12 Married / Language: English / Race: White Male  History of Present Illness Dean Sportsman MD; 01/19/2016 3:11 PM) The patient is a 45 year old male who presents with an anal fissure. Note for "Anal fissure": Patient sent for surgical consultation at the request of Dr. Dorena [name] with [facility] gastroenterology. 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  Pleasant gentleman that is struggled with anal pain and bleeding for the past decade. Usually it is been mild or moderate and intermittent. However he will have periods of sharp anal pain with bleeding. Can last for several weeks. Tapers off but never totally goes away. History of chronic lower back pain with chronic surgeries. Has had episodes of severe constipation around the time of the procedure is. He feels most likely it started with constipation after them. He normally moves his bowels about 2 or 3 times a day. Not on a fiber supplement. Not using stool softeners to often. He has used diltiazem cream under Dr. Madilyn [name] is help. He thinks it's only helped a little and never resolved it. Another flare a couple months ago. Not improved with the diltiazem cream. Had a colonoscopy done. The rest of the colon was unremarkable. Obvious anal fissure. Looks like some enlarged hemorrhoids to me as well.  Because of the persistent anal pain and bleeding despite better bowel function and topical therapy, he has a persistent fissure. Surgical consultation requested.  No personal nor family history of GI/colon cancer, inflammatory bowel disease, irritable bowel syndrome, allergy such as Celiac Sprue, dietary/dairy problems, colitis, ulcers nor gastritis. No recent sick contacts/gastroenteritis. No travel outside the country. No changes in diet. No dysphagia to solids or liquids. No significant heartburn or reflux. No hematochezia, hematemesis,  coffee ground emesis. No evidence of prior gastric/peptic ulceration.   Other Problems [name], CMA; 01/19/2016 9:31 AM) Back Pain Depression High blood pressure  Past Surgical History [name], CMA; 01/19/2016 9:31 AM) Spinal Surgery - Lower Back Spinal Surgery - Neck Spinal Surgery Midback  Diagnostic Studies History [name], CMA; 01/19/2016 9:31 AM) Colonoscopy within last year  Allergies [name], CMA; 01/19/2016 9:33 AM) Penicillin G Benzathine & Proc *PENICILLINS* Lisinopril *CHEMICALS*  Medication History ([name], CMA; 01/19/2016 9:37 AM) Atenolol (25MG  Tablet, Oral) Active. Gabapentin (600MG  Tablet, Oral) Active. HydroCHLOROthiazide (25MG  Tablet, Oral) Active. Ibuprofen (200MG  Tablet, Oral) Active. Norvasc (10MG  Tablet, Oral) Active. TraZODone HCl (100MG  Tablet, Oral) Active. DILTIAZEM Gel (2% Gel, External) Active. Allegra (180MG  Tablet, Oral) Active. Zanaflex (4MG  Tablet, Oral) Active. Wellbutrin XL (300MG  Tablet ER 24HR, Oral) Active. Medications Reconciled  Social History [name], CMA; 01/19/2016 9:31 AM) Alcohol use Remotely quit alcohol use. No caffeine use No drug use Tobacco use Never smoker.  Family History [name], CMA; 01/19/2016 9:31 AM) Depression Father. Diabetes Mellitus Father. Hypertension Father. Melanoma Mother.     Review of Systems [name] CMA; 01/19/2016 9:31 AM) General Not Present- Appetite Loss, Chills, Fatigue, Fever, Night Sweats, Weight Gain and Weight Loss. Skin Not Present- Change in Wart/Mole, Dryness, Hives, Jaundice, New Lesions, Non-Healing Wounds, Rash and Ulcer. HEENT Present- Ringing in the Ears, Seasonal Allergies and Wears glasses/contact lenses. Not Present- Earache, Hearing Loss, Hoarseness, Nose Bleed, Oral Ulcers, Sinus Pain, Sore Throat, Visual Disturbances and Yellow Eyes. Breast Not Present- Breast Mass, Breast Pain,  Nipple Discharge and Skin Changes. Cardiovascular Not Present- Chest Pain, Difficulty Breathing Lying Down, Leg Cramps, Palpitations, Rapid Heart Rate, Shortness of  Breath and Swelling of Extremities. Gastrointestinal Present- Hemorrhoids and Rectal Pain. Not Present- Abdominal Pain, Bloating, Bloody Stool, Change in Bowel Habits, Chronic diarrhea, Constipation, Difficulty Swallowing, Excessive gas, Gets full quickly at meals, Indigestion, Nausea and Vomiting. Male Genitourinary Not Present- Blood in Urine, Change in Urinary Stream, Frequency, Impotence, Nocturia, Painful Urination, Urgency and Urine Leakage. Musculoskeletal Present- Back Pain, Joint Pain and Joint Stiffness. Not Present- Muscle Pain, Muscle Weakness and Swelling of Extremities. Neurological Not Present- Decreased Memory, Fainting, Headaches, Numbness, Seizures, Tingling, Tremor, Trouble walking and Weakness. Psychiatric Present- Depression. Not Present- Anxiety, Bipolar, Change in Sleep Pattern, Fearful and Frequent crying. Hematology Not Present- Blood Thinners, Easy Bruising, Excessive bleeding, Gland problems, HIV and Persistent Infections.  Vitals ([name] CMA; 01/19/2016 9:32 AM) 01/19/2016 9:32 AM Weight: 252 lb Height: 68in Body Surface Area: 2.25 m Body Mass Index: 38.32 kg/m  Pulse: 76 (Regular)  BP: 128/82 (Sitting, Left Arm, Standard)      Physical Exam Dean Sportsman MD; 01/19/2016 9:58 AM)  General Mental Status-Alert. General Appearance-Not in acute distress, Not Sickly. Orientation-Oriented X3. Hydration-Well hydrated. Voice-Normal.  Integumentary Global Assessment Upon inspection and palpation of skin surfaces of the - Axillae: non-tender, no inflammation or ulceration, no drainage. and Distribution of scalp and body hair is normal. General Characteristics Temperature - normal warmth is noted.  Head and Neck Head-normocephalic, atraumatic with no lesions or palpable  masses. Face Global Assessment - atraumatic, no absence of expression. Neck Global Assessment - no abnormal movements, no bruit auscultated on the right, no bruit auscultated on the left, no decreased range of motion, non-tender. Trachea-midline. Thyroid Gland Characteristics - non-tender.  Eye Eyeball - Left-Extraocular movements intact, No Nystagmus. Eyeball - Right-Extraocular movements intact, No Nystagmus. Cornea - Left-No Hazy. Cornea - Right-No Hazy. Sclera/Conjunctiva - Left-No scleral icterus, No Discharge. Sclera/Conjunctiva - Right-No scleral icterus, No Discharge. Pupil - Left-Direct reaction to light normal. Pupil - Right-Direct reaction to light normal.  ENMT Ears Pinna - Left - no drainage observed, no generalized tenderness observed. Right - no drainage observed, no generalized tenderness observed. Nose and Sinuses External Inspection of the Nose - no destructive lesion observed. Inspection of the nares - Left - quiet respiration. Right - quiet respiration. Mouth and Throat Lips - Upper Lip - no fissures observed, no pallor noted. Lower Lip - no fissures observed, no pallor noted. Nasopharynx - no discharge present. Oral Cavity/Oropharynx - Tongue - no dryness observed. Oral Mucosa - no cyanosis observed. Hypopharynx - no evidence of airway distress observed.  Chest and Lung Exam Inspection Movements - Normal and Symmetrical. Accessory muscles - No use of accessory muscles in breathing. Palpation Palpation of the chest reveals - Non-tender. Auscultation Breath sounds - Normal and Clear.  Cardiovascular Auscultation Rhythm - Regular. Murmurs & Other Heart Sounds - Auscultation of the heart reveals - No Murmurs and No Systolic Clicks.  Abdomen Inspection Inspection of the abdomen reveals - No Visible peristalsis and No Abnormal pulsations. Umbilicus - No Bleeding, No Urine drainage. Palpation/Percussion Palpation and Percussion of the abdomen  reveal - Soft, Non Tender, No Rebound tenderness, No Rigidity (guarding) and No Cutaneous hyperesthesia. Note: Abdomen obese & thin but soft. Nontender, nondistended. No guarding. No umbilical no other hernias  Male Genitourinary Sexual Maturity Tanner 5 - Adult hair pattern and Adult penile size and shape. Note: No inguinal hernias. Normal external genitalia.  Rectal Note: Obvious posterior midline anal fissure with increased sphincter tone. No external hemorrhoids.   Perianal skin clean with good  hygiene. No pruritis ani. No pilonidal disease. No fissure. Held off on digital/anoscopic exam  Peripheral Vascular Upper Extremity Inspection - Left - No Cyanotic nailbeds, Not Ischemic. Right - No Cyanotic nailbeds, Not Ischemic.  Neurologic Neurologic evaluation reveals -normal attention span and ability to concentrate, able to name objects and repeat phrases. Appropriate fund of knowledge , normal sensation and normal coordination. Mental Status Affect - not angry, not paranoid. Cranial Nerves-Normal Bilaterally. Gait-Normal.  Neuropsychiatric Mental status exam performed with findings of-able to articulate well with normal speech/language, rate, volume and coherence, thought content normal with ability to perform basic computations and apply abstract reasoning and no evidence of hallucinations, delusions, obsessions or homicidal/suicidal ideation.  Musculoskeletal Global Assessment Spine, Ribs and Pelvis - no instability, subluxation or laxity. Right Upper Extremity - no instability, subluxation or laxity.  Lymphatic Head & Neck  General Head & Neck Lymphatics: Bilateral - Description - No Localized lymphadenopathy. Axillary  General Axillary Region: Bilateral - Description - No Localized lymphadenopathy. Femoral & Inguinal  Generalized Femoral & Inguinal Lymphatics: Left - Description - No Localized lymphadenopathy. Right - Description - No Localized  lymphadenopathy.    Assessment & Plan Dean Sportsman MD; 01/19/2016 9:57 AM)  ANAL FISSURE (K60.2) Impression: Chronic posterior midline anal fissure. Failed muscle relaxant topical diltiazem therapy.  I think he would benefit from anorectal examination under anesthesia with partial internal sphincterotomy. Given the fact he has episodes of significant rectal bleeding and prolapsing tissue, I suspect he has a prolapsing internal hemorrhoid as well . Therefore examination under anesthesia to help deal with all issues at the same time.  He appreciates consultation and is eager to proceed with surgery to correct the problem.  Current Plans Pt Education - [facility] Anal Fissure (Gross) The anatomy & physiology of the anorectal region was discussed. The pathophysiology of anal fissure and differential diagnosis was discussed. Natural history progression was discussed. I stressed the importance of a bowel regimen to have daily soft bowel movements to minimize progression of disease.  The patient's condition is not adequately controlled. Non-operative treatment has not healed the fissure. Therefore, I recommended examination under anesthesia for better examination to confirm the diagnosis and treat by lateral internal sphincterotomy to relax the spasm better & allow the fissure to heal. Technique, benefits, alternatives were discussed. I noted a good likelihood this will help address the problem. Risks such as bleeding, pain, incontinence, recurrence, heart attack, death, and other risks were discussed.  Educational handouts further explaining the pathology, treatment options, and bowel regimen were given as well. The patient expressed understanding & wishes to proceed with surgery.  You are being scheduled for surgery - Our schedulers will call you.  You should hear from our office's scheduling department within 5 working days about the location, date, and time of surgery. We try to make accommodations  for patient's preferences in scheduling surgery, but sometimes the OR schedule or the surgeon's schedule prevents [place] from making those accommodations.  If you have not heard from our office [phone]) in 5 working days, call the office and ask for your surgeon's nurse.  If you have other questions about your diagnosis, plan, or surgery, call the office and ask for your surgeon's nurse.  PROLAPSED INTERNAL HEMORRHOIDS, GRADE 3 (K64.2) Impression: Intermittent prolapse and significant bleeding suspicious for prolapsing internal hemorrhoid. Usually anal fissures call us mild spotting at the most. Suspicious on pictures of colonoscopy retroflex rectal view as well.  Most likely will benefit from internal hemorrhoidectomy. Plan  examination under anesthesia. Try not to overdo things.  Current Plans Pt Education - [facility] Hemorrhoids (Gross): discussed with patient and provided information. Pt Education - Pamphlet Given - The Hemorrhoid Book: discussed with patient and provided information. ENCOUNTER FOR PREOPERATIVE EXAMINATION FOR GENERAL SURGICAL PROCEDURE (Z01.818)  Current Plans You are being scheduled for surgery - Our schedulers will call you.  You should hear from our office's scheduling department within 5 working days about the location, date, and time of surgery. We try to make accommodations for patient's preferences in scheduling surgery, but sometimes the OR schedule or the surgeon's schedule prevents us from making those accommodations.  If you have not heard from our office [phone]) in 5 working days, call the office and ask for your surgeon's nurse.  If you have other questions about your diagnosis, plan, or surgery, call the office and ask for your surgeon's nurse.  Pt Education - [facility] Rectal Prep for Anorectal outpatient/office surgery: discussed with patient and provided information. Pt Education - [facility] Rectal Surgery HCI (Gross): discussed with patient and provided  information. Pt Education - [facility] Pelvic Floor Exercises (Kegels) and Dysfunction HCI (Gross)  Dean SportsmanSteven C. Gross, M.D., F.A.C.S. Gastrointestinal and Minimally Invasive Surgery [facility] [address] [place], [place] [id number] [phone] Main / Paging

## 2016-01-29 ENCOUNTER — Encounter (HOSPITAL_BASED_OUTPATIENT_CLINIC_OR_DEPARTMENT_OTHER): Payer: Self-pay | Admitting: *Deleted

## 2016-02-02 ENCOUNTER — Encounter (HOSPITAL_BASED_OUTPATIENT_CLINIC_OR_DEPARTMENT_OTHER): Payer: Self-pay | Admitting: *Deleted

## 2016-02-02 NOTE — Progress Notes (Signed)
NPO AFTER MN WITH EXCEPTION CLEAR LIQUIDS UNTIL 0830 (NO CREAM/ MILK PRODUCTS).  ARRIVE AT 1300.  NEEDS ISTAT 8 .  CURRENT EKG IN CHART AND EPIC.  WILL DO HIBICLENS HS BEFORE AND AM DOS.  PT VERBALIZED UNDERSTANDING RECTAL PREP INSTRUCTIONS GIVEN TO HIM FROM OFFICE.

## 2016-02-04 ENCOUNTER — Ambulatory Visit: Payer: Self-pay | Admitting: Surgery

## 2016-02-05 ENCOUNTER — Ambulatory Visit (HOSPITAL_BASED_OUTPATIENT_CLINIC_OR_DEPARTMENT_OTHER): Payer: 59 | Admitting: Anesthesiology

## 2016-02-05 ENCOUNTER — Encounter (HOSPITAL_BASED_OUTPATIENT_CLINIC_OR_DEPARTMENT_OTHER): Payer: Self-pay | Admitting: Anesthesiology

## 2016-02-05 ENCOUNTER — Encounter (HOSPITAL_BASED_OUTPATIENT_CLINIC_OR_DEPARTMENT_OTHER)
Admission: RE | Disposition: A | Discharge status: Home / Self Care | Payer: Self-pay | Source: Ambulatory Visit | Attending: Surgery

## 2016-02-05 ENCOUNTER — Ambulatory Visit (HOSPITAL_BASED_OUTPATIENT_CLINIC_OR_DEPARTMENT_OTHER)
Admission: RE | Admit: 2016-02-05 | Discharge: 2016-02-05 | Disposition: A | Discharge status: Home / Self Care | Payer: 59 | Source: Ambulatory Visit | Attending: Surgery | Admitting: Surgery

## 2016-02-05 DIAGNOSIS — K601 Chronic anal fissure: Secondary | ICD-10-CM | POA: Insufficient documentation

## 2016-02-05 DIAGNOSIS — G4730 Sleep apnea, unspecified: Secondary | ICD-10-CM | POA: Insufficient documentation

## 2016-02-05 DIAGNOSIS — Z6838 Body mass index (BMI) 38.0-38.9, adult: Secondary | ICD-10-CM | POA: Insufficient documentation

## 2016-02-05 DIAGNOSIS — E669 Obesity, unspecified: Secondary | ICD-10-CM | POA: Diagnosis not present

## 2016-02-05 DIAGNOSIS — K642 Third degree hemorrhoids: Secondary | ICD-10-CM | POA: Diagnosis not present

## 2016-02-05 DIAGNOSIS — Z79899 Other long term (current) drug therapy: Secondary | ICD-10-CM | POA: Insufficient documentation

## 2016-02-05 DIAGNOSIS — G8929 Other chronic pain: Secondary | ICD-10-CM | POA: Insufficient documentation

## 2016-02-05 DIAGNOSIS — I10 Essential (primary) hypertension: Secondary | ICD-10-CM | POA: Diagnosis not present

## 2016-02-05 DIAGNOSIS — F329 Major depressive disorder, single episode, unspecified: Secondary | ICD-10-CM | POA: Diagnosis not present

## 2016-02-05 DIAGNOSIS — M1990 Unspecified osteoarthritis, unspecified site: Secondary | ICD-10-CM | POA: Insufficient documentation

## 2016-02-05 DIAGNOSIS — Z791 Long term (current) use of non-steroidal anti-inflammatories (NSAID): Secondary | ICD-10-CM | POA: Diagnosis not present

## 2016-02-05 DIAGNOSIS — K644 Residual hemorrhoidal skin tags: Secondary | ICD-10-CM | POA: Insufficient documentation

## 2016-02-05 DIAGNOSIS — K602 Anal fissure, unspecified: Secondary | ICD-10-CM | POA: Diagnosis present

## 2016-02-05 DIAGNOSIS — M545 Low back pain: Secondary | ICD-10-CM | POA: Insufficient documentation

## 2016-02-05 HISTORY — DX: Chronic anal fissure: K60.1

## 2016-02-05 HISTORY — DX: Alcohol dependence, in remission: F10.21

## 2016-02-05 HISTORY — DX: Personal history of self-harm: Z91.5

## 2016-02-05 HISTORY — DX: Other sleep apnea: G47.39

## 2016-02-05 HISTORY — DX: Presence of other specified functional implants: Z96.89

## 2016-02-05 HISTORY — DX: Low back pain: M54.5

## 2016-02-05 HISTORY — PX: SPHINCTEROTOMY: SHX5279

## 2016-02-05 HISTORY — DX: Personal history of suicidal behavior: Z91.51

## 2016-02-05 HISTORY — DX: Major depressive disorder, single episode, unspecified: F32.9

## 2016-02-05 HISTORY — DX: Opioid dependence, uncomplicated: F11.20

## 2016-02-05 HISTORY — DX: Other specified postprocedural states: Z98.890

## 2016-02-05 HISTORY — DX: Third degree hemorrhoids: K64.2

## 2016-02-05 HISTORY — DX: Primary central sleep apnea: G47.31

## 2016-02-05 HISTORY — PX: HEMORRHOID SURGERY: SHX153

## 2016-02-05 HISTORY — DX: Low back pain, unspecified: M54.50

## 2016-02-05 HISTORY — DX: Other psychoactive substance dependence, uncomplicated: F19.20

## 2016-02-05 HISTORY — DX: Presence of spectacles and contact lenses: Z97.3

## 2016-02-05 HISTORY — DX: Other chronic pain: G89.29

## 2016-02-05 LAB — POCT I-STAT, CHEM 8
BUN: 18 mg/dL (ref 6–20)
Calcium, Ion: 1.24 mmol/L (ref 1.13–1.30)
Chloride: 103 mmol/L (ref 101–111)
Creatinine, Ser: 1.1 mg/dL (ref 0.61–1.24)
Glucose, Bld: 92 mg/dL (ref 65–99)
HCT: 42 % (ref 39.0–52.0)
Hemoglobin: 14.3 g/dL (ref 13.0–17.0)
Potassium: 4.4 mmol/L (ref 3.5–5.1)
Sodium: 141 mmol/L (ref 135–145)
TCO2: 24 mmol/L (ref 0–100)

## 2016-02-05 SURGERY — EXAM UNDER ANESTHESIA
Anesthesia: General | Site: Rectum

## 2016-02-05 MED ORDER — ACETAMINOPHEN 500 MG PO TABS
1000.0000 mg | ORAL_TABLET | ORAL | Status: DC
  Filled 2016-02-05: qty 2

## 2016-02-05 MED ORDER — ONDANSETRON HCL 4 MG/2ML IJ SOLN
INTRAMUSCULAR | Status: AC
  Filled 2016-02-05: qty 2

## 2016-02-05 MED ORDER — PROPOFOL 10 MG/ML IV BOLUS
INTRAVENOUS | Status: DC | PRN
  Administered 2016-02-05: 250 mg via INTRAVENOUS

## 2016-02-05 MED ORDER — ONDANSETRON HCL 4 MG/2ML IJ SOLN
INTRAMUSCULAR | Status: DC | PRN
  Administered 2016-02-05: 4 mg via INTRAVENOUS

## 2016-02-05 MED ORDER — GABAPENTIN 300 MG PO CAPS
300.0000 mg | ORAL_CAPSULE | ORAL | Status: DC
  Filled 2016-02-05: qty 1

## 2016-02-05 MED ORDER — KETOROLAC TROMETHAMINE 30 MG/ML IJ SOLN
INTRAMUSCULAR | Status: DC | PRN
  Administered 2016-02-05: 15 mg via INTRAVENOUS

## 2016-02-05 MED ORDER — BUPIVACAINE LIPOSOME 1.3 % IJ SUSP
INTRAMUSCULAR | Status: DC | PRN
  Administered 2016-02-05: 20 mL

## 2016-02-05 MED ORDER — FENTANYL CITRATE (PF) 100 MCG/2ML IJ SOLN
25.0000 ug | INTRAMUSCULAR | Status: DC | PRN
  Filled 2016-02-05: qty 1

## 2016-02-05 MED ORDER — PROMETHAZINE HCL 25 MG/ML IJ SOLN
6.2500 mg | INTRAMUSCULAR | Status: DC | PRN
Start: 2016-02-05 — End: 2016-02-05
  Filled 2016-02-05: qty 1

## 2016-02-05 MED ORDER — CHLORHEXIDINE GLUCONATE CLOTH 2 % EX PADS
6.0000 | MEDICATED_PAD | Freq: Once | CUTANEOUS | Status: DC
  Filled 2016-02-05: qty 6

## 2016-02-05 MED ORDER — DEXAMETHASONE SODIUM PHOSPHATE 10 MG/ML IJ SOLN
INTRAMUSCULAR | Status: AC
  Filled 2016-02-05: qty 1

## 2016-02-05 MED ORDER — DEXAMETHASONE SODIUM PHOSPHATE 4 MG/ML IJ SOLN
INTRAMUSCULAR | Status: DC | PRN
  Administered 2016-02-05: 10 mg via INTRAVENOUS

## 2016-02-05 MED ORDER — LACTATED RINGERS IV SOLN
INTRAVENOUS | Status: DC
  Administered 2016-02-05 (×3): via INTRAVENOUS
  Filled 2016-02-05: qty 1000

## 2016-02-05 MED ORDER — ACETAMINOPHEN 325 MG PO TABS
325.0000 mg | ORAL_TABLET | ORAL | Status: DC | PRN
  Filled 2016-02-05: qty 2

## 2016-02-05 MED ORDER — BUPIVACAINE LIPOSOME 1.3 % IJ SUSP
20.0000 mL | INTRAMUSCULAR | Status: DC
  Filled 2016-02-05: qty 20

## 2016-02-05 MED ORDER — FENTANYL CITRATE (PF) 250 MCG/5ML IJ SOLN
INTRAMUSCULAR | Status: AC
  Filled 2016-02-05: qty 5

## 2016-02-05 MED ORDER — LIDOCAINE 2% (20 MG/ML) 5 ML SYRINGE
INTRAMUSCULAR | Status: DC | PRN
  Administered 2016-02-05: 100 mg via INTRAVENOUS

## 2016-02-05 MED ORDER — FENTANYL CITRATE (PF) 100 MCG/2ML IJ SOLN
25.0000 ug | INTRAMUSCULAR | Status: DC | PRN
  Administered 2016-02-05 (×2): 50 ug via INTRAVENOUS
  Filled 2016-02-05: qty 1

## 2016-02-05 MED ORDER — MIDAZOLAM HCL 2 MG/2ML IJ SOLN
INTRAMUSCULAR | Status: AC
  Filled 2016-02-05: qty 2

## 2016-02-05 MED ORDER — SODIUM CHLORIDE 0.9 % IR SOLN
Status: DC | PRN
  Administered 2016-02-05: 500 mL

## 2016-02-05 MED ORDER — BUPIVACAINE-EPINEPHRINE 0.25% -1:200000 IJ SOLN
INTRAMUSCULAR | Status: DC | PRN
  Administered 2016-02-05: 30 mL

## 2016-02-05 MED ORDER — GENTAMICIN SULFATE 40 MG/ML IJ SOLN
5.0000 mg/kg | INTRAVENOUS | Status: AC
  Administered 2016-02-05: 430 mg via INTRAVENOUS
  Filled 2016-02-05: qty 10.75

## 2016-02-05 MED ORDER — KETOROLAC TROMETHAMINE 15 MG/ML IJ SOLN
INTRAMUSCULAR | Status: DC | PRN
  Administered 2016-02-05: 15 mg via INTRAVENOUS

## 2016-02-05 MED ORDER — OXYCODONE HCL 5 MG PO TABS: 5.0000 mg | ORAL_TABLET | Freq: Four times a day (QID) | ORAL | 0 refills | Status: DC | PRN

## 2016-02-05 MED ORDER — ACETAMINOPHEN 160 MG/5ML PO SOLN
325.0000 mg | ORAL | Status: DC | PRN
  Filled 2016-02-05: qty 20.3

## 2016-02-05 MED ORDER — CELECOXIB 400 MG PO CAPS
400.0000 mg | ORAL_CAPSULE | ORAL | Status: AC
  Administered 2016-02-05: 400 mg via ORAL
  Filled 2016-02-05 (×2): qty 1

## 2016-02-05 MED ORDER — FENTANYL CITRATE (PF) 100 MCG/2ML IJ SOLN
INTRAMUSCULAR | Status: AC
  Filled 2016-02-05: qty 2

## 2016-02-05 MED ORDER — PROPOFOL 10 MG/ML IV BOLUS
INTRAVENOUS | Status: AC
  Filled 2016-02-05: qty 40

## 2016-02-05 MED ORDER — SUCCINYLCHOLINE CHLORIDE 20 MG/ML IJ SOLN
INTRAMUSCULAR | Status: DC | PRN
  Administered 2016-02-05: 100 mg via INTRAVENOUS

## 2016-02-05 MED ORDER — CLINDAMYCIN PHOSPHATE 900 MG/50ML IV SOLN
INTRAVENOUS | Status: AC
  Filled 2016-02-05: qty 50

## 2016-02-05 MED ORDER — CLINDAMYCIN PHOSPHATE 900 MG/50ML IV SOLN
900.0000 mg | INTRAVENOUS | Status: AC
  Administered 2016-02-05: 900 mg via INTRAVENOUS
  Filled 2016-02-05: qty 50

## 2016-02-05 SURGICAL SUPPLY — 71 items
AIRWAY BERMAN 80MM 2580 50/BX (TUBING) IMPLANT
APL SKNCLS STERI-STRIP NONHPOA (GAUZE/BANDAGES/DRESSINGS) ×1
BENZOIN TINCTURE PRP APPL 2/3 (GAUZE/BANDAGES/DRESSINGS) ×2 IMPLANT
BLADE CLIPPER SURG (BLADE) IMPLANT
BLADE HEX COATED 2.75 (ELECTRODE) ×2 IMPLANT
BLADE SURG 10 STRL SS (BLADE) IMPLANT
BLADE SURG 15 STRL LF DISP TIS (BLADE) ×1 IMPLANT
BLADE SURG 15 STRL SS (BLADE) ×2
BRIEF STRETCH FOR OB PAD LRG (UNDERPADS AND DIAPERS) ×2 IMPLANT
CANISTER SUCTION 1200CC (MISCELLANEOUS) ×1 IMPLANT
CLEANER CAUTERY TIP 5X5 PAD (MISCELLANEOUS) ×1 IMPLANT
COVER BACK TABLE 60X90IN (DRAPES) ×2 IMPLANT
COVER MAYO STAND STRL (DRAPES) ×1 IMPLANT
DECANTER SPIKE VIAL GLASS SM (MISCELLANEOUS) ×2 IMPLANT
DRAPE LAPAROTOMY 100X72 PEDS (DRAPES) ×2 IMPLANT
DRAPE LG THREE QUARTER DISP (DRAPES) ×1 IMPLANT
DRSG PAD ABDOMINAL 8X10 ST (GAUZE/BANDAGES/DRESSINGS) ×1 IMPLANT
ELECT NDL TIP 2.8 STRL (NEEDLE) IMPLANT
ELECT NEEDLE TIP 2.8 STRL (NEEDLE) IMPLANT
ELECT REM PT RETURN 9FT ADLT (ELECTROSURGICAL) ×2
ELECTRODE REM PT RTRN 9FT ADLT (ELECTROSURGICAL) ×1 IMPLANT
GAUZE SPONGE 4X4 12PLY STRL (GAUZE/BANDAGES/DRESSINGS) ×1 IMPLANT
GAUZE SPONGE 4X4 12PLY STRL LF (GAUZE/BANDAGES/DRESSINGS) IMPLANT
GLOVE BIOGEL PI IND STRL 8 (GLOVE) ×1 IMPLANT
GLOVE BIOGEL PI INDICATOR 8 (GLOVE) ×1
GLOVE ECLIPSE 8.0 STRL XLNG CF (GLOVE) ×2 IMPLANT
GLOVE INDICATOR 8.0 STRL GRN (GLOVE) ×2 IMPLANT
GOWN STRL REUS W/ TWL LRG LVL3 (GOWN DISPOSABLE) ×1 IMPLANT
GOWN STRL REUS W/ TWL XL LVL3 (GOWN DISPOSABLE) ×1 IMPLANT
GOWN STRL REUS W/TWL LRG LVL3 (GOWN DISPOSABLE) ×2
GOWN STRL REUS W/TWL XL LVL3 (GOWN DISPOSABLE) ×2
KIT ROOM TURNOVER WOR (KITS) ×2 IMPLANT
LEGGING LITHOTOMY PAIR STRL (DRAPES) ×1 IMPLANT
MANIFOLD NEPTUNE II (INSTRUMENTS) IMPLANT
NDL HYPO 25X1 1.5 SAFETY (NEEDLE) ×1 IMPLANT
NDL SAFETY ECLIPSE 18X1.5 (NEEDLE) ×1 IMPLANT
NEEDLE HYPO 18GX1.5 SHARP (NEEDLE) ×2
NEEDLE HYPO 22GX1.5 SAFETY (NEEDLE) ×2 IMPLANT
NEEDLE HYPO 25X1 1.5 SAFETY (NEEDLE) IMPLANT
NS IRRIG 500ML POUR BTL (IV SOLUTION) ×2 IMPLANT
PACK BASIN DAY SURGERY FS (CUSTOM PROCEDURE TRAY) ×2 IMPLANT
PAD ABD 8X10 STRL (GAUZE/BANDAGES/DRESSINGS) ×2 IMPLANT
PAD CLEANER CAUTERY TIP 5X5 (MISCELLANEOUS) ×1
PAD PREP 24X48 CUFFED NSTRL (MISCELLANEOUS) ×2 IMPLANT
PENCIL BUTTON HOLSTER BLD 10FT (ELECTRODE) ×2 IMPLANT
SHEARS HARMONIC 9CM CVD (BLADE) IMPLANT
SPONGE GAUZE 4X4 12PLY (GAUZE/BANDAGES/DRESSINGS) IMPLANT
SPONGE SURGIFOAM ABS GEL 100 (HEMOSTASIS) IMPLANT
SPONGE SURGIFOAM ABS GEL 12-7 (HEMOSTASIS) IMPLANT
SURGILUBE 2OZ TUBE FLIPTOP (MISCELLANEOUS) ×2 IMPLANT
SUT CHROMIC 2 0 SH (SUTURE) IMPLANT
SUT CHROMIC 3 0 SH 27 (SUTURE) IMPLANT
SUT VIC AB 2-0 SH 27 (SUTURE) ×2
SUT VIC AB 2-0 SH 27X BRD (SUTURE) IMPLANT
SUT VIC AB 2-0 SH 27XBRD (SUTURE) ×1 IMPLANT
SUT VIC AB 2-0 UR6 27 (SUTURE) IMPLANT
SUT VICRYL 0 UR6 27IN ABS (SUTURE) IMPLANT
SUT VICRYL AB 2 0 TIE (SUTURE) ×1 IMPLANT
SUT VICRYL AB 2 0 TIES (SUTURE) ×2
SWAB COLLECTION DEVICE MRSA (MISCELLANEOUS) IMPLANT
SYR BULB IRRIGATION 50ML (SYRINGE) ×2 IMPLANT
SYR CONTROL 10ML LL (SYRINGE) ×2 IMPLANT
TAPE HYPAFIX 4 X10 (GAUZE/BANDAGES/DRESSINGS) ×2 IMPLANT
TOWEL OR 17X24 6PK STRL BLUE (TOWEL DISPOSABLE) ×4 IMPLANT
TOWEL OR NON WOVEN STRL DISP B (DISPOSABLE) ×2 IMPLANT
TRAY DSU PREP LF (CUSTOM PROCEDURE TRAY) ×2 IMPLANT
TUBE ANAEROBIC SPECIMEN COL (MISCELLANEOUS) IMPLANT
TUBE CONNECTING 12X1/4 (SUCTIONS) ×2 IMPLANT
UNDERPAD 30X30 INCONTINENT (UNDERPADS AND DIAPERS) ×2 IMPLANT
WATER STERILE IRR 500ML POUR (IV SOLUTION) ×2 IMPLANT
YANKAUER SUCT BULB TIP NO VENT (SUCTIONS) ×2 IMPLANT

## 2016-02-05 NOTE — Discharge Instructions (Signed)
Post Anesthesia Home Care Instructions  Activity: Get plenty of rest for the remainder of the day. A responsible adult should stay with you for 24 hours following the procedure.  For the next 24 hours, DO NOT: -Drive a car -Advertising copywriter -Drink alcoholic beverages -Take any medication unless instructed by your physician -Make any legal decisions or sign important papers.  Meals: Start with liquid foods such as gelatin or soup. Progress to regular foods as tolerated. Avoid greasy, spicy, heavy foods. If nausea and/or vomiting occur, drink only clear liquids until the nausea and/or vomiting subsides. Call your physician if vomiting continues.  Special Instructions/Symptoms: Your throat may feel dry or sore from the anesthesia or the breathing tube placed in your throat during surgery. If this causes discomfort, gargle with warm salt water. The discomfort should disappear within 24 hours.  If you had a scopolamine patch placed behind your ear for the management of post- operative nausea and/or vomiting:  1. The medication in the patch is effective for 72 hours, after which it should be removed.  Wrap patch in a tissue and discard in the trash. Wash hands thoroughly with soap and water. 2. You may remove the patch earlier than 72 hours if you experience unpleasant side effects which may include dry mouth, dizziness or visual disturbances. 3. Avoid touching the patch. Wash your hands with soap and water after contact with the patch.   Information for Discharge Teaching: EXPAREL (bupivacaine liposome injectable suspension)   Your surgeon gave you EXPAREL(bupivacaine) in your surgical incision to help control your pain after surgery.   EXPAREL is a local anesthetic that provides pain relief by numbing the tissue around the surgical site.  EXPAREL is designed to release pain medication over time and can control pain for up to 72 hours.  Depending on how you respond to EXPAREL, you may  require less pain medication during your recovery.  Possible side effects:  Temporary loss of sensation or ability to move in the area where bupivacaine was injected.  Nausea, vomiting, constipation  Rarely, numbness and tingling in your mouth or lips, lightheadedness, or anxiety may occur.  Call your doctor right away if you think you may be experiencing any of these sensations, or if you have other questions regarding possible side effects.  Follow all other discharge instructions given to you by your surgeon or nurse. Eat a healthy diet and drink plenty of water or other fluids.  If you return to the hospital for any reason within 96 hours following the administration of EXPAREL, please inform your health care providers.ANORECTAL SURGERY:  POST OPERATIVE INSTRUCTIONS  ######################################################################  EAT Gradually transition to a high fiber diet with a fiber supplement over the next few weeks after discharge.  Start with a pureed / full liquid diet (see below)  WALK Walk an hour a day.  Control your pain to do that.    CONTROL PAIN Control pain so that you can walk, sleep, tolerate sneezing/coughing, go up/down stairs.  HAVE A BOWEL MOVEMENT DAILY Keep your bowels regular to avoid problems.  OK to try a laxative to override constipation.  OK to use an antidairrheal to slow down diarrhea.  Call if not better after 2 tries  CALL IF YOU HAVE PROBLEMS/CONCERNS Call if you are still struggling despite following these instructions. Call if you have concerns not answered by these instructions  ######################################################################    1. Take your usually prescribed home medications unless otherwise directed. 2. DIET: Follow a light bland  diet the first 24 hours after arrival home, such as soup, liquids, crackers, etc.  Be sure to include lots of fluids daily.  Avoid fast food or heavy meals as your are more  likely to get nauseated.  Eat a low fat the next few days after surgery.   3. PAIN CONTROL: a. Pain is best controlled by a usual combination of three different methods TOGETHER: i. Ice/Heat ii. Over the counter pain medication iii. Prescription pain medication b. Most patients will experience some swelling and discomfort in the anus/rectal area. and incisions.  Ice packs or heat (30-60 minutes up to 6 times a day) will help. Use ice for the first few days to help decrease swelling and bruising, then switch to heat such as warm towels, sitz baths, warm baths, etc to help relax tight/sore spots and speed recovery.  Some people prefer to use ice alone, heat alone, alternating between ice & heat.  Experiment to what works for you.  Swelling and bruising can take several weeks to resolve.   c. It is helpful to take an over-the-counter pain medication regularly for the first few weeks.  Choose one of the following that works best for you: i. Naproxen (Aleve, etc)  Two 220mg  tabs twice a day ii. Ibuprofen (Advil, etc) Three 200mg  tabs four times a day (every meal & bedtime) iii. Acetaminophen (Tylenol, etc) 500-650mg  four times a day (every meal & bedtime) d. A  prescription for pain medication (such as oxycodone, hydrocodone, etc) should be given to you upon discharge.  Take your pain medication as prescribed.  i. If you are having problems/concerns with the prescription medicine (does not control pain, nausea, vomiting, rash, itching, etc), please call us [phone] to see if we need to switch you to a different pain medicine that will work better for you and/or control your side effect better. ii. If you need a refill on your pain medication, please contact your pharmacy.  They will contact our office to request authorization. Prescriptions will not be filled after 5 pm or on week-ends.  Use a Sitz Bath 4-8 times a day for relief   [employer] A sitz bath is a warm water bath taken in the sitting  position that covers only the hips and buttocks. It may be used for either healing or hygiene purposes. Sitz baths are also used to relieve pain, itching, or muscle spasms. The water may contain medicine. Moist heat will help you heal and relax.  HOME CARE INSTRUCTIONS  Take 3 to 4 sitz baths a day. 2. Fill the bathtub half full with warm water. 3. Sit in the water and open the drain a little. 4. Turn on the warm water to keep the tub half full. Keep the water running constantly. 5. Soak in the water for 15 to 20 minutes. 6. After the sitz bath, pat the affected area dry first.   4. KEEP YOUR BOWELS REGULAR a. The goal is one bowel movement a day b. Avoid getting constipated.  Between the surgery and the pain medications, it is common to experience some constipation.  Increasing fluid intake and taking a fiber supplement (such as Metamucil, Citrucel, FiberCon, MiraLax, etc) 1-2 times a day regularly will usually help prevent this problem from occurring.  A mild laxative (prune juice, Milk of Magnesia, MiraLax, etc) should be taken according to package directions if there are no bowel movements after 48 hours. c. Watch out for diarrhea.  If you have many loose bowel  movements, simplify your diet to bland foods & liquids for a few days.  Stop any stool softeners and decrease your fiber supplement.  Switching to mild anti-diarrheal medications (Kayopectate, Pepto Bismol) can help.  If this worsens or does not improve, please call us.  5. Wound Care  a. Remove your bandages the day after surgery.  Unless discharge instructions indicate otherwise, leave your bandage dry and in place overnight.  Remove the bandage during your first bowel movement.   b. Wear an absorbent pad or soft cotton gauze in your underwear as needed to catch any drainage and help keep the area  c. Keep the area clean and dry.  Bathe / shower every day.  Keep the area clean by showering / bathing over the incision / wound.   It is  okay to soak an open wound to help wash it.  Wet wipes or showers / gentle washing after bowel movements is often less traumatic than regular toilet paper. d. Bonita Quin will often notice bleeding with bowel movements.  This should slow down by the end of the first week of surgery e. Expect some drainage.  This should slow down, too, by the end of the first week of surgery.  Wear an absorbent pad or soft cotton gauze in your underwear until the drainage stops.  6. ACTIVITIES as tolerated:   a. You may resume regular (light) daily activities beginning the next day--such as daily self-care, walking, climbing stairs--gradually increasing activities as tolerated.  If you can walk 30 minutes without difficulty, it is safe to try more intense activity such as jogging, treadmill, bicycling, low-impact aerobics, swimming, etc. b. Save the most intensive and strenuous activity for last such as sit-ups, heavy lifting, contact sports, etc  Refrain from any heavy lifting or straining until you are off narcotics for pain control.   c. DO NOT PUSH THROUGH PAIN.  Let pain be your guide: If it hurts to do something, don't do it.  Pain is your body warning you to avoid that activity for another week until the pain goes down. d. You may drive when you are no longer taking prescription pain medication, you can comfortably sit for long periods of time, and you can safely maneuver your car and apply brakes. e. Bonita Quin may have sexual intercourse when it is comfortable.  7. FOLLOW UP in our office a. Please call [facility] at [phone] to set up an appointment to see your surgeon in the office for a follow-up appointment approximately 2 weeks after your surgery. b. Make sure that you call for this appointment the day you arrive home to insure a convenient appointment time. 10. IF YOU HAVE DISABILITY OR FAMILY LEAVE FORMS, BRING THEM TO THE OFFICE FOR PROCESSING.  DO NOT GIVE THEM TO YOUR DOCTOR.        WHEN TO CALL us [phone]  [phone]: 1. Poor pain control 2. Reactions / problems with new medications (rash/itching, nausea, etc)  3. Fever over 101.5 F (38.5 C) 4. Inability to urinate 5. Nausea and/or vomiting 6. Worsening swelling or bruising 7. Continued bleeding from incision. 8. Increased pain, redness, or drainage from the incision  The clinic staff is available to answer your questions during regular business hours (8:30am-5pm).  Please dont hesitate to call and ask to speak to one of our nurses for clinical concerns.   A surgeon from [facility] is always on call at the hospitals   If you have a medical emergency, go to  the nearest emergency room or call 911.    [facility] [address], [address], [place], [place]  [id number] ? MAIN: [phone] ? TOLL FREE: [phone] ? FAX [phone] [email or web address]   WHAT IS AN ANAL FISSURE? An anal fissure (fissure-in-ano) is a small, oval shaped tear in skin that lines the opening of the anus. Fissures typically cause severe pain and bleeding with bowel movements. Fissures are quite common in the general population, but are often confused with other causes of pain and bleeding, such as hemorrhoids.  WHAT ARE THE SYMPTOMS OF AN ANAL FISSURE? The typical symptoms of an anal fissure include severe pain during, and especially after, a bowel movement, lasting from several minutes to a few hours. Patients may also notice bright red blood from the anus that can be seen on the toilet paper or on the stool. Between bowel movements, patients with anal fissures are often relatively symptom-free. Many patients are fearful of having a bowel movement and may try to avoid defecation secondary to the pain.   WHAT CAUSES AN ANAL FISSURE? Fissures are usually caused by trauma to the inner lining of the anus. Patients with tight anal sphincter muscles (i.e., increased muscle tone) are more prone to developing anal  fissures. A hard, dry bowel movement is typically responsible, but loose stools and diarrhea can also be the cause. Following a bowel movement, severe anal pain can produce spasm of the anal sphincter muscle, resulting in a decrease in blood flow to the site of the injury, thus impairing healing of the wound. The next bowel movement results in more pain, anal spasm, decreased blood flow to the area, and the cycle continues. Treatments are aimed at interrupting this cycle by relaxing the anal sphincter muscle to promote healing of the fissure.   Other, less common, causes include inflammatory conditions and certain anal infections or tumors. Anal fissures may be acute (recent onset) or chronic (present for a long period of time). Chronic fissures may be more difficult to treat, and may also have an external lump associated with the tear, called a sentinel pile or skin tag, as well as extra tissue just inside the anal canal (hypertrophied papilla) .  WHAT IS THE TREATMENT OF ANAL FISSURES? The majority of anal fissures do not require surgery. The most common treatment for an acute anal fissure consists of making the stool more formed and bulky with a diet high in fiber and utilization of over-the-counter fiber supplementation (totaling 25-35 grams of fiber/day). Stool softeners and increasing water intake may be necessary to promote soft bowel movements and aid in the healing process. Topical anesthetics for pain and warm tub baths (sitz baths) for 10-20 minutes several times a day (especially after bowel movements) are soothing and promote relaxation of the anal muscles, which may help the healing process.  Other medications (such as diltiazem) may be prescribed that allow relaxation of the anal sphincter muscles. Your surgeon will go over benefits and side-effects of each of these with you. Narcotic pain medications are not recommended for anal fissures, as they promote constipation. Chronic fissures are  generally more difficult to treat, and your surgeon may advise surgical treatment.  WILL THE PROBLEM RETURN? Fissures can recur easily, and it is quite common for a fully healed fissure to recur after a hard bowel movement or other trauma. Even when the pain and bleeding have subsided, it is very important to continue good bowel habits and a diet high in fiber  as a lifestyle change. If the problem returns without an obvious cause, further assessment is warranted.  GETTING TO GOOD BOWEL HEALTH. Irregular bowel habits such as constipation and diarrhea can lead to many problems over time.  Having one soft bowel movement a day is the most important way to prevent further problems.  The anorectal canal is designed to handle stretching and feces to safely manage our ability to get rid of solid waste (feces, poop, stool) out of our body.  BUT, hard constipated stools can act like ripping concrete bricks and diarrhea can be a burning fire to this very sensitive area of our body, causing inflamed hemorrhoids, anal fissures, increasing risk is perirectal abscesses, abdominal pain/bloating, an making irritable bowel worse.     The goal: ONE SOFT BOWEL MOVEMENT A DAY!  To have soft, regular bowel movements:   Drink at least 8 tall glasses of water a day.    Take plenty of fiber.  Fiber is the undigested part of plant food that passes into the colon, acting s natures broom to encourage bowel motility and movement.  Fiber can absorb and hold large amounts of water. This results in a larger, bulkier stool, which is soft and easier to pass. Work gradually over several weeks up to 6 servings a day of fiber (25g a day even more if needed) in the form of: o Vegetables -- Root (potatoes, carrots, turnips), leafy green (lettuce, salad greens, celery, spinach), or cooked high residue (cabbage, broccoli, etc) o Fruit -- Fresh (unpeeled skin & pulp), Dried (prunes, apricots, cherries, etc ),  or stewed ( applesauce)   o Whole grain breads, pasta, etc (whole wheat)  o Bran cereals   Bulking Agents -- This type of water-retaining fiber generally is easily obtained each day by one of the following:  o Psyllium bran -- The psyllium plant is remarkable because its ground seeds can retain so much water. This product is available as Metamucil, Konsyl, Effersyllium, Per Diem Fiber, or the less expensive generic preparation in drug and health food stores. Although labeled a laxative, it really is not a laxative.  o Methylcellulose -- This is another fiber derived from wood which also retains water. It is available as Citrucel. o Polyethylene Glycol - and artificial fiber commonly called Miralax or Glycolax.  It is helpful for people with gassy or bloated feelings with regular fiber o Flax Seed - a less gassy fiber than psyllium  No reading or other relaxing activity while on the toilet. If bowel movements take longer than 5 minutes, you are too constipated  AVOID CONSTIPATION.  High fiber and water intake usually takes care of this.  Sometimes a laxative is needed to stimulate more frequent bowel movements, but   Laxatives are not a good long-term solution as it can wear the colon out. o Osmotics (Milk of Magnesia, Fleets phosphosoda, Magnesium citrate, MiraLax, GoLytely) are safer than  o Stimulants (Senokot, Castor Oil, Dulcolax, Ex Lax)    o Do not take laxatives for more than 7days in a row.   IF SEVERELY CONSTIPATED, try a Bowel Retraining Program: o Do not use laxatives.  o Eat a diet high in roughage, such as bran cereals and leafy vegetables.  o Drink six (6) ounces of prune or apricot juice each morning.  o Eat two (2) large servings of stewed fruit each day.  o Take one (1) heaping tablespoon of a psyllium-based bulking agent twice a day. Use sugar-free sweetener when possible to avoid  excessive calories.  o Eat a normal breakfast.  o Set aside 15 minutes after breakfast to sit on the toilet, but do  not strain to have a bowel movement.  o If you do not have a bowel movement by the third day, use an enema and repeat the above steps.   Controlling diarrhea o Switch to liquids and simpler foods for a few days to avoid stressing your intestines further. o Avoid dairy products (especially milk & ice cream) for a short time.  The intestines often can lose the ability to digest lactose when stressed. o Avoid foods that cause gassiness or bloating.  Typical foods include beans and other legumes, cabbage, broccoli, and dairy foods.  Every person has some sensitivity to other foods, so listen to our body and avoid those foods that trigger problems for you. o Adding fiber (Citrucel, Metamucil, psyllium, Miralax) gradually can help thicken stools by absorbing excess fluid and retrain the intestines to act more normally.  Slowly increase the dose over a few weeks.  Too much fiber too soon can backfire and cause cramping & bloating. o Probiotics (such as active yogurt, Align, etc) may help repopulate the intestines and colon with normal bacteria and calm down a sensitive digestive tract.  Most studies show it to be of mild help, though, and such products can be costly. o Medicines: - Bismuth subsalicylate (ex. Kayopectate, Pepto Bismol) every 30 minutes for up to 6 doses can help control diarrhea.  Avoid if pregnant. - Loperamide (Immodium) can slow down diarrhea.  Start with two tablets (  total) first and then try one tablet every 6 hours.  Avoid if you are having fevers or severe pain.  If you are not better or start feeling worse, stop all medicines and call your doctor for advice o Call your doctor if you are getting worse or not better.  Sometimes further testing (cultures, endoscopy, X-ray studies, bloodwork, etc) may be needed to help diagnose and treat the cause of the diarrhea. o   WHAT CAN BE DONE IF THE FISSURE DOES NOT HEAL? A fissure that fails to respond to conservative measures should be  re-examined. Persistent hard or loose bowel movements, scarring, or spasm of the internal anal muscle all contribute to delayed healing. Other medical problems such as inflammatory bowel disease (Crohns disease), infections, or anal tumors can cause symptoms similar to anal fissures. Patients suffering from persistent anal pain should be examined to exclude these symptoms. This may include a colonoscopy or an exam in the operating room under anesthesia.  WHAT DOES SURGERY INVOLVE? Surgical options for treating anal fissure include Botulinum toxin (Botox) injection into the anal sphincter and surgical division of a portion of the internal anal sphincter (lateral internal sphincterotomy). Both of these are performed typically as outpatient, same-day procedures, or occasionally in the office setting. The goal of these surgical options is to promote relaxation of the anal sphincter, thereby decreasing anal pain and spasm, allowing the fissure to heal. Botox injection results in healing in 50-80% of patients, while sphincterotomy is reported to be over 90% successful. If a sentinel pile is present, it may be removed to promote healing of the fissure. All surgical procedures carry some risk, and a sphincterotomy can rarely interfere with ones ability to control gas and stool. Your colon and rectal surgeon will discuss these risks with you to determine the appropriate treatment for your particular situation.  HOW LONG IS THE RECOVERY AFTER SURGERY? It is important to note that  complete healing with both medical and surgical treatments can take up to approximately 6-10 weeks. However, acute pain after surgery often disappears after a few days. Most patients will be able to return to work and resume daily activities in a few short days after the surgery.  CAN FISSURES LEAD TO COLON CANCER? Absolutely not. Persistent symptoms, however, need careful evaluation since other conditions other than an anal fissure can  cause similar symptoms. Your colon and rectal surgeon may request additional tests, even if your fissure has successfully healed. A colonoscopy may be required to exclude other causes of rectal bleeding.

## 2016-02-05 NOTE — H&P (View-Only) (Signed)
[name] 01/19/2016 9:30 AM Location: [facility] Patient #: [id number] DOB: 1970-12-12 Married / Language: English / Race: White Male  History of Present Illness Dean Sportsman MD; 01/19/2016 3:11 PM) The patient is a 45 year old male who presents with an anal fissure. Note for "Anal fissure": Patient sent for surgical consultation at the request of Dr. Dorena Cookey with [facility] gastroenterology. 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  Pleasant gentleman that is struggled with anal pain and bleeding for the past decade. Usually it is been mild or moderate and intermittent. However he will have periods of sharp anal pain with bleeding. Can last for several weeks. Tapers off but never totally goes away. History of chronic lower back pain with chronic surgeries. Has had episodes of severe constipation around the time of the procedure is. He feels most likely it started with constipation after them. He normally moves his bowels about 2 or 3 times a day. Not on a fiber supplement. Not using stool softeners to often. He has used diltiazem cream under Dr. Madilyn Fireman is help. He thinks it's only helped a little and never resolved it. Another flare a couple months ago. Not improved with the diltiazem cream. Had a colonoscopy done. The rest of the colon was unremarkable. Obvious anal fissure. Looks like some enlarged hemorrhoids to me as well.  Because of the persistent anal pain and bleeding despite better bowel function and topical therapy, he has a persistent fissure. Surgical consultation requested.  No personal nor family history of GI/colon cancer, inflammatory bowel disease, irritable bowel syndrome, allergy such as Celiac Sprue, dietary/dairy problems, colitis, ulcers nor gastritis. No recent sick contacts/gastroenteritis. No travel outside the country. No changes in diet. No dysphagia to solids or liquids. No significant heartburn or reflux. No hematochezia, hematemesis,  coffee ground emesis. No evidence of prior gastric/peptic ulceration.   Other Problems [name], CMA; 01/19/2016 9:31 AM) Back Pain Depression High blood pressure  Past Surgical History [name], CMA; 01/19/2016 9:31 AM) Spinal Surgery - Lower Back Spinal Surgery - Neck Spinal Surgery Midback  Diagnostic Studies History [name], CMA; 01/19/2016 9:31 AM) Colonoscopy within last year  Allergies [name], CMA; 01/19/2016 9:33 AM) Penicillin G Benzathine & Proc *PENICILLINS* Lisinopril *CHEMICALS*  Medication History ([name], CMA; 01/19/2016 9:37 AM) Atenolol (25MG  Tablet, Oral) Active. Gabapentin (600MG  Tablet, Oral) Active. HydroCHLOROthiazide (25MG  Tablet, Oral) Active. Ibuprofen (200MG  Tablet, Oral) Active. Norvasc (10MG  Tablet, Oral) Active. TraZODone HCl (100MG  Tablet, Oral) Active. DILTIAZEM Gel (2% Gel, External) Active. Allegra (180MG  Tablet, Oral) Active. Zanaflex (4MG  Tablet, Oral) Active. Wellbutrin XL (300MG  Tablet ER 24HR, Oral) Active. Medications Reconciled  Social History [name], CMA; 01/19/2016 9:31 AM) Alcohol use Remotely quit alcohol use. No caffeine use No drug use Tobacco use Never smoker.  Family History [name], CMA; 01/19/2016 9:31 AM) Depression Father. Diabetes Mellitus Father. Hypertension Father. Melanoma Mother.     Review of Systems [name] CMA; 01/19/2016 9:31 AM) General Not Present- Appetite Loss, Chills, Fatigue, Fever, Night Sweats, Weight Gain and Weight Loss. Skin Not Present- Change in Wart/Mole, Dryness, Hives, Jaundice, New Lesions, Non-Healing Wounds, Rash and Ulcer. HEENT Present- Ringing in the Ears, Seasonal Allergies and Wears glasses/contact lenses. Not Present- Earache, Hearing Loss, Hoarseness, Nose Bleed, Oral Ulcers, Sinus Pain, Sore Throat, Visual Disturbances and Yellow Eyes. Breast Not Present- Breast Mass, Breast Pain,  Nipple Discharge and Skin Changes. Cardiovascular Not Present- Chest Pain, Difficulty Breathing Lying Down, Leg Cramps, Palpitations, Rapid Heart Rate, Shortness of  Breath and Swelling of Extremities. Gastrointestinal Present- Hemorrhoids and Rectal Pain. Not Present- Abdominal Pain, Bloating, Bloody Stool, Change in Bowel Habits, Chronic diarrhea, Constipation, Difficulty Swallowing, Excessive gas, Gets full quickly at meals, Indigestion, Nausea and Vomiting. Male Genitourinary Not Present- Blood in Urine, Change in Urinary Stream, Frequency, Impotence, Nocturia, Painful Urination, Urgency and Urine Leakage. Musculoskeletal Present- Back Pain, Joint Pain and Joint Stiffness. Not Present- Muscle Pain, Muscle Weakness and Swelling of Extremities. Neurological Not Present- Decreased Memory, Fainting, Headaches, Numbness, Seizures, Tingling, Tremor, Trouble walking and Weakness. Psychiatric Present- Depression. Not Present- Anxiety, Bipolar, Change in Sleep Pattern, Fearful and Frequent crying. Hematology Not Present- Blood Thinners, Easy Bruising, Excessive bleeding, Gland problems, HIV and Persistent Infections.  Vitals ([name] CMA; 01/19/2016 9:32 AM) 01/19/2016 9:32 AM Weight: 252 lb Height: 68in Body Surface Area: 2.25 m Body Mass Index: 38.32 kg/m  Pulse: 76 (Regular)  BP: 128/82 (Sitting, Left Arm, Standard)      Physical Exam Dean Sportsman MD; 01/19/2016 9:58 AM)  General Mental Status-Alert. General Appearance-Not in acute distress, Not Sickly. Orientation-Oriented X3. Hydration-Well hydrated. Voice-Normal.  Integumentary Global Assessment Upon inspection and palpation of skin surfaces of the - Axillae: non-tender, no inflammation or ulceration, no drainage. and Distribution of scalp and body hair is normal. General Characteristics Temperature - normal warmth is noted.  Head and Neck Head-normocephalic, atraumatic with no lesions or palpable  masses. Face Global Assessment - atraumatic, no absence of expression. Neck Global Assessment - no abnormal movements, no bruit auscultated on the right, no bruit auscultated on the left, no decreased range of motion, non-tender. Trachea-midline. Thyroid Gland Characteristics - non-tender.  Eye Eyeball - Left-Extraocular movements intact, No Nystagmus. Eyeball - Right-Extraocular movements intact, No Nystagmus. Cornea - Left-No Hazy. Cornea - Right-No Hazy. Sclera/Conjunctiva - Left-No scleral icterus, No Discharge. Sclera/Conjunctiva - Right-No scleral icterus, No Discharge. Pupil - Left-Direct reaction to light normal. Pupil - Right-Direct reaction to light normal.  ENMT Ears Pinna - Left - no drainage observed, no generalized tenderness observed. Right - no drainage observed, no generalized tenderness observed. Nose and Sinuses External Inspection of the Nose - no destructive lesion observed. Inspection of the nares - Left - quiet respiration. Right - quiet respiration. Mouth and Throat Lips - Upper Lip - no fissures observed, no pallor noted. Lower Lip - no fissures observed, no pallor noted. Nasopharynx - no discharge present. Oral Cavity/Oropharynx - Tongue - no dryness observed. Oral Mucosa - no cyanosis observed. Hypopharynx - no evidence of airway distress observed.  Chest and Lung Exam Inspection Movements - Normal and Symmetrical. Accessory muscles - No use of accessory muscles in breathing. Palpation Palpation of the chest reveals - Non-tender. Auscultation Breath sounds - Normal and Clear.  Cardiovascular Auscultation Rhythm - Regular. Murmurs & Other Heart Sounds - Auscultation of the heart reveals - No Murmurs and No Systolic Clicks.  Abdomen Inspection Inspection of the abdomen reveals - No Visible peristalsis and No Abnormal pulsations. Umbilicus - No Bleeding, No Urine drainage. Palpation/Percussion Palpation and Percussion of the abdomen  reveal - Soft, Non Tender, No Rebound tenderness, No Rigidity (guarding) and No Cutaneous hyperesthesia. Note: Abdomen obese & thin but soft. Nontender, nondistended. No guarding. No umbilical no other hernias  Male Genitourinary Sexual Maturity Tanner 5 - Adult hair pattern and Adult penile size and shape. Note: No inguinal hernias. Normal external genitalia.  Rectal Note: Obvious posterior midline anal fissure with increased sphincter tone. No external hemorrhoids.   Perianal skin clean with good  hygiene. No pruritis ani. No pilonidal disease. No fissure. Held off on digital/anoscopic exam  Peripheral Vascular Upper Extremity Inspection - Left - No Cyanotic nailbeds, Not Ischemic. Right - No Cyanotic nailbeds, Not Ischemic.  Neurologic Neurologic evaluation reveals -normal attention span and ability to concentrate, able to name objects and repeat phrases. Appropriate fund of knowledge , normal sensation and normal coordination. Mental Status Affect - not angry, not paranoid. Cranial Nerves-Normal Bilaterally. Gait-Normal.  Neuropsychiatric Mental status exam performed with findings of-able to articulate well with normal speech/language, rate, volume and coherence, thought content normal with ability to perform basic computations and apply abstract reasoning and no evidence of hallucinations, delusions, obsessions or homicidal/suicidal ideation.  Musculoskeletal Global Assessment Spine, Ribs and Pelvis - no instability, subluxation or laxity. Right Upper Extremity - no instability, subluxation or laxity.  Lymphatic Head & Neck  General Head & Neck Lymphatics: Bilateral - Description - No Localized lymphadenopathy. Axillary  General Axillary Region: Bilateral - Description - No Localized lymphadenopathy. Femoral & Inguinal  Generalized Femoral & Inguinal Lymphatics: Left - Description - No Localized lymphadenopathy. Right - Description - No Localized  lymphadenopathy.    Assessment & Plan Dean Sportsman MD; 01/19/2016 9:57 AM)  ANAL FISSURE (K60.2) Impression: Chronic posterior midline anal fissure. Failed muscle relaxant topical diltiazem therapy.  I think he would benefit from anorectal examination under anesthesia with partial internal sphincterotomy. Given the fact he has episodes of significant rectal bleeding and prolapsing tissue, I suspect he has a prolapsing internal hemorrhoid as well . Therefore examination under anesthesia to help deal with all issues at the same time.  He appreciates consultation and is eager to proceed with surgery to correct the problem.  Current Plans Pt Education - CCS Anal Fissure (Gross) The anatomy & physiology of the anorectal region was discussed. The pathophysiology of anal fissure and differential diagnosis was discussed. Natural history progression was discussed. I stressed the importance of a bowel regimen to have daily soft bowel movements to minimize progression of disease.  The patient's condition is not adequately controlled. Non-operative treatment has not healed the fissure. Therefore, I recommended examination under anesthesia for better examination to confirm the diagnosis and treat by lateral internal sphincterotomy to relax the spasm better & allow the fissure to heal. Technique, benefits, alternatives were discussed. I noted a good likelihood this will help address the problem. Risks such as bleeding, pain, incontinence, recurrence, heart attack, death, and other risks were discussed.  Educational handouts further explaining the pathology, treatment options, and bowel regimen were given as well. The patient expressed understanding & wishes to proceed with surgery.  You are being scheduled for surgery - Our schedulers will call you.  You should hear from our office's scheduling department within 5 working days about the location, date, and time of surgery. We try to make accommodations  for patient's preferences in scheduling surgery, but sometimes the OR schedule or the surgeon's schedule prevents [place] from making those accommodations.  If you have not heard from our office [phone]) in 5 working days, call the office and ask for your surgeon's nurse.  If you have other questions about your diagnosis, plan, or surgery, call the office and ask for your surgeon's nurse.  PROLAPSED INTERNAL HEMORRHOIDS, GRADE 3 (K64.2) Impression: Intermittent prolapse and significant bleeding suspicious for prolapsing internal hemorrhoid. Usually anal fissures call us mild spotting at the most. Suspicious on pictures of colonoscopy retroflex rectal view as well.  Most likely will benefit from internal hemorrhoidectomy. Plan  examination under anesthesia. Try not to overdo things.  Current Plans Pt Education - CCS Hemorrhoids (Gross): discussed with patient and provided information. Pt Education - Pamphlet Given - The Hemorrhoid Book: discussed with patient and provided information. ENCOUNTER FOR PREOPERATIVE EXAMINATION FOR GENERAL SURGICAL PROCEDURE (Z01.818)  Current Plans You are being scheduled for surgery - Our schedulers will call you.  You should hear from our office's scheduling department within 5 working days about the location, date, and time of surgery. We try to make accommodations for patient's preferences in scheduling surgery, but sometimes the OR schedule or the surgeon's schedule prevents us from making those accommodations.  If you have not heard from our office [phone]) in 5 working days, call the office and ask for your surgeon's nurse.  If you have other questions about your diagnosis, plan, or surgery, call the office and ask for your surgeon's nurse.  Pt Education - CCS Rectal Prep for Anorectal outpatient/office surgery: discussed with patient and provided information. Pt Education - CCS Rectal Surgery HCI (Gross): discussed with patient and provided  information. Pt Education - [facility] Pelvic Floor Exercises (Kegels) and Dysfunction HCI (Gross)  Dean SportsmanSteven C. Gross, M.D., F.A.C.S. Gastrointestinal and Minimally Invasive Surgery [facility] [address] [place], [place] [address] [phone] Main / Paging

## 2016-02-05 NOTE — Transfer of Care (Signed)
Immediate Anesthesia Transfer of Care Note  Patient: Oswaldo MilianJason H Stanwood  Procedure(s) Performed: Procedure(s): EXAM UNDER ANESTHESIA (N/A) LATERAL INTERNAL SPHINCTEROTOMY (N/A) HEMORRHOIDECTOMY (N/A)  Patient Location: PACU  Anesthesia Type:General  Level of Consciousness: awake, alert , oriented and patient cooperative  Airway & Oxygen Therapy: Patient Spontanous Breathing and Patient connected to nasal cannula oxygen  Post-op Assessment: Report given to RN and Post -op Vital signs reviewed and stable  Post vital signs: Reviewed and stable  Last Vitals:  Vitals:   02/05/16 1318 02/05/16 1510  BP: 127/82 117/76  Pulse: 79 93  Resp: 16 11  Temp: 37 C 36.2 C    Last Pain:  Vitals:   02/05/16 1510  TempSrc:   PainSc: 0-No pain      Patients Stated Pain Goal: 4 (02/05/16 1341)  Complications: No apparent anesthesia complications

## 2016-02-05 NOTE — Op Note (Signed)
02/05/2016  2:55 PM  PATIENT:  [name]  45 y.o. male  Patient Care Team: Tracey Harries, MD as PCP - General (Family Medicine)  PRE-OPERATIVE DIAGNOSIS:  Anal fissure refractory to medical management. Grade 3 bleeding internal hemorrhoid.  POST-OPERATIVE DIAGNOSIS:    Anal fissure refractory to medical management.  Grade 3 bleeding internal hemorrhoid.  PROCEDURE:   EXAM UNDER ANESTHESIA LATERAL INTERNAL SPHINCTEROTOMY HEMORRHOIDECTOMY  SURGEON:  Surgeon(s): Karie Soda, MD  ASSISTANT: none   ANESTHESIA:   Local field block Anorectal block General  0.25% bupivacaine with epinephrine at the beginning of the case.  Liposomal bupivacaine (Experel) at the end of the case.  EBL:  No intake/output data recorded.  Delay start of Pharmacological VTE agent (>24hrs) due to surgical blood loss or risk of bleeding:  no  DRAINS: none   SPECIMEN:  Source of Specimen:  Left lateral int/external hemorrhoid  DISPOSITION OF SPECIMEN:  PATHOLOGY  COUNTS:  YES  PLAN OF CARE: Discharge to home after PACU  PATIENT DISPOSITION:  PACU - guarded condition.  INDICATION: Patient with probable chronic anal fissure refractory to bowel regimen & medical management.  Rectal bleeding & prolapse suspicious for hemorrhoid as well.  I recommended examination and surgical treatment:  The anatomy & physiology of the anorectal region was discussed.  The pathophysiology of anal fissure and differential diagnosis was discussed.  Natural history progression  was discussed.   I stressed the importance of a bowel regimen to have daily soft bowel movements to minimize progression of disease.     The patient's condition is not adequately controlled.  Non-operative treatment has not healed the fissure.  Therefore, I recommended examination under anesthesia for better examination to confirm the diagnosis and treat by lateral internal sphincterotomy to relax the spasm better & allow the fissure to heal.   Technique, benefits, alternatives were discussed.   I noted a good likelihood this will help address the problem.  Risks such as bleeding, pain, incontinence, recurrence, heart attack, death, and other risks were discussed.    Educational handouts further explaining the pathology, treatment options, and bowel regimen were given as well.  The patient expressed understanding & wishes to proceed with surgery.  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    Sphincterotomy location:  Left lateral anal canal.  80% distal internal sphincterotomy performed  Inflamed left lateral internal hemorrhoid with external hemorrhoid component as well.  Mild right posterior external hemorrhoid irritation as well  IDESCRIPTION:   Informed consent was confirmed. Patient underwent general anesthesia without difficulty. Patient was placed into prone positioning.  The perianal region was prepped and draped in sterile fashion. Surgical timeout confirmed or plan.  I did digital rectal examination and then transitioned over to anoscopy to get a sense of the anatomy.  I identified an anal fissure in the posterior midline anal canal.  The sphincter tone was increased.  Mild stricture.  No abscess located.  No fistula.  Grade 2 internal hemorrhoids.  Most inflamed left lateral.  External hemorrhoids, left lateral and right posterior especially.  I went ahead and proceeded with internal sphinterotmy technique.  I excise through the anoderm of the left lateral anal canal longitudinally.  I identified the internal and external sphincters.  Elevating the internal sphincter.  I proceeded with a partial internal sphincterotomy starting distally and moving proximally using cautery.  This involved the distal 80% thickness to finally get it to relax.  This provided improved  relaxation of the anal sphincter.  Proceed with left lateral internal hemorrhoidal ligation as well as a 2-0  Vicryl suture starting 6 cm proximal anal verge and running more distally.  This help close part of the sphincterotomy wound is well.  There is still some significant external hemorrhoidal tissue eyes.  I closed the distal anal canal wound and external wound to good result with a running 3-0 chromic suture, leaving a 5 mm distal opening to allow drainage.  Hemostasis was excellent.  I reexamined the anal canal.   There is still some stricture narrowing at the fissure.  Therefore I excised it including the right posterior sterile hemorrhoid and left an open wound 1 x 1 cm that was much more soft and flat.  Help relax some mild stricturing in the region.  There is was no more narrowing.  Hemostasis was excellent.  I repeated anoscopy and examination.  Hemostasis was good.  Patient is being extubated go to recovery room.  I am about to discuss the patient's status to the family.  Instructions are written as well.  [name], M.D., F.A.C.S. Gastrointestinal and Minimally Invasive Surgery [facility] [address] [place], [place] [address] [phone] Main / Paging

## 2016-02-05 NOTE — Anesthesia Preprocedure Evaluation (Addendum)
Anesthesia Evaluation  Patient identified by MRN, date of birth, ID band Patient awake    Reviewed: Allergy & Precautions, NPO status , Patient's Chart, lab work & pertinent test results  History of Anesthesia Complications Negative for: history of anesthetic complications  Airway Mallampati: II  TM Distance: >3 FB Neck ROM: Full    Dental  (+) Teeth Intact, Dental Advisory Given   Pulmonary neg pulmonary ROS, sleep apnea ,    Pulmonary exam normal        Cardiovascular Exercise Tolerance: Good hypertension, Pt. on home beta blockers and Pt. on medications Normal cardiovascular exam Rhythm:Regular Rate:Normal     Neuro/Psych PSYCHIATRIC DISORDERS Anxiety Depression    GI/Hepatic negative GI ROS, Neg liver ROS, (+)     substance abuse (also, benzo and narcotic abuse)  alcohol use and cocaine use,   Endo/Other  obese  Renal/GU negative Renal ROS     Musculoskeletal  (+) Arthritis ,   Abdominal   Peds  Hematology   Anesthesia Other Findings   Reproductive/Obstetrics                            Anesthesia Physical  Anesthesia Plan  ASA: III  Anesthesia Plan: General   Post-op Pain Management:    Induction: Intravenous  Airway Management Planned: Oral ETT  Additional Equipment:   Intra-op Plan:   Post-operative Plan: Extubation in OR  Informed Consent: I have reviewed the patients History and Physical, chart, labs and discussed the procedure including the risks, benefits and alternatives for the proposed anesthesia with the patient or authorized representative who has indicated his/her understanding and acceptance.   Dental advisory given  Plan Discussed with: CRNA, Anesthesiologist and Surgeon  Anesthesia Plan Comments:         Anesthesia Quick Evaluation

## 2016-02-05 NOTE — Anesthesia Procedure Notes (Signed)
Procedure Name: Intubation Date/Time: 02/05/2016 2:17 PM Performed by: Tyrone NineSAUVE, ROBIN F Pre-anesthesia Checklist: Patient identified, Timeout performed, Emergency Drugs available, Suction available and Patient being monitored Patient Re-evaluated:Patient Re-evaluated prior to inductionOxygen Delivery Method: Circle system utilized Preoxygenation: Pre-oxygenation with 100% oxygen Intubation Type: IV induction Ventilation: Mask ventilation without difficulty Laryngoscope Size: Mac and 4 Grade View: Grade II Tube type: Oral Number of attempts: 1 Placement Confirmation: ETT inserted through vocal cords under direct vision,  positive ETCO2 and breath sounds checked- equal and bilateral Secured at: 23 cm Tube secured with: Tape Dental Injury: Teeth and Oropharynx as per pre-operative assessment

## 2016-02-05 NOTE — Interval H&P Note (Signed)
History and Physical Interval Note:  02/05/2016 1:46 PM  [name]  has presented today for surgery, with the diagnosis of Anal fissure refractory to medical management. Grade 3 bleeding internal hemorrhoid.  The various methods of treatment have been discussed with the patient and family. After consideration of risks, benefits and other options for treatment, the patient has consented to  Procedure(s): EXAM UNDER ANESTHESIA (N/A) LATERAL INTERNAL SPHINCTEROTOMY (N/A) HEMORRHOIDECTOMY (N/A) as a surgical intervention .  The patient's history has been reviewed, patient examined, no change in status, stable for surgery.  I have reviewed the patient's chart and labs.  Questions were answered to the patient's satisfaction.     GROSS,STEVEN C.

## 2016-02-08 ENCOUNTER — Encounter (HOSPITAL_BASED_OUTPATIENT_CLINIC_OR_DEPARTMENT_OTHER): Payer: Self-pay | Admitting: Surgery

## 2016-02-08 NOTE — Anesthesia Postprocedure Evaluation (Signed)
Anesthesia Post Note  Patient: [name]  Procedure(s) Performed: Procedure(s) (LRB): EXAM UNDER ANESTHESIA (N/A) LATERAL INTERNAL SPHINCTEROTOMY (N/A) HEMORRHOIDECTOMY (N/A)  Patient location during evaluation: PACU Anesthesia Type: General Level of consciousness: awake and alert Pain management: pain level controlled Vital Signs Assessment: post-procedure vital signs reviewed and stable Respiratory status: spontaneous breathing, nonlabored ventilation, respiratory function stable and patient connected to nasal cannula oxygen Cardiovascular status: blood pressure returned to baseline and stable Postop Assessment: no signs of nausea or vomiting Anesthetic complications: no     Last Vitals:  Vitals:   02/05/16 1600 02/05/16 1615  BP: 113/70 131/90  Pulse: 81 87  Resp: 13 16  Temp:  36.7 C    Last Pain:  Vitals:   02/05/16 1610  TempSrc:   PainSc: 3    Pain Goal: Patients Stated Pain Goal: 4 (02/05/16 1341)               Bonita Quinichard S Guidetti

## 2017-03-04 IMAGING — RF DG MYELOGRAPHY LUMBAR INJ CERVICAL
11 series · 11 of 11 positions shown · non-contrast
Comparison: none

CLINICAL DATA: Neck pain.  RIGHT arm pain.
TECHNIQUE: Contiguous axial images were obtained through the Cervical spine
after the intrathecal infusion of infusion. Coronal and sagittal
reconstructions were obtained of the axial image sets.

[Series 1: (hospital) · 1 of 1 slices shown (1 of 2)]
[im 1/1]
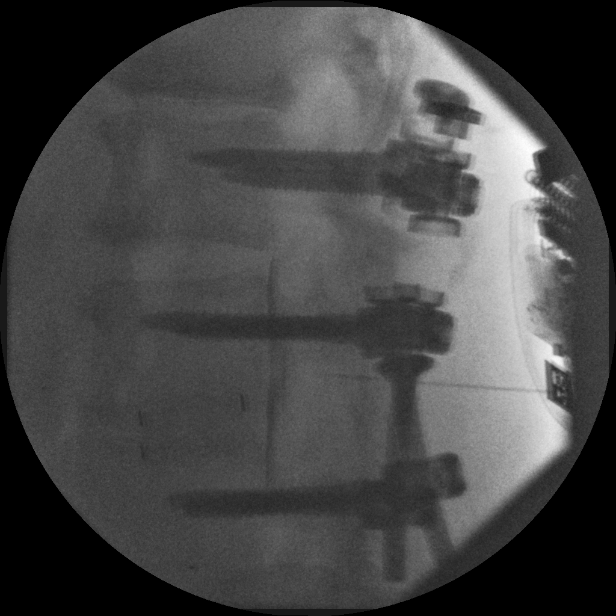

[Series 2: (hospital) · 1 of 1 slices shown (2 of 2)]
[im 1/1]
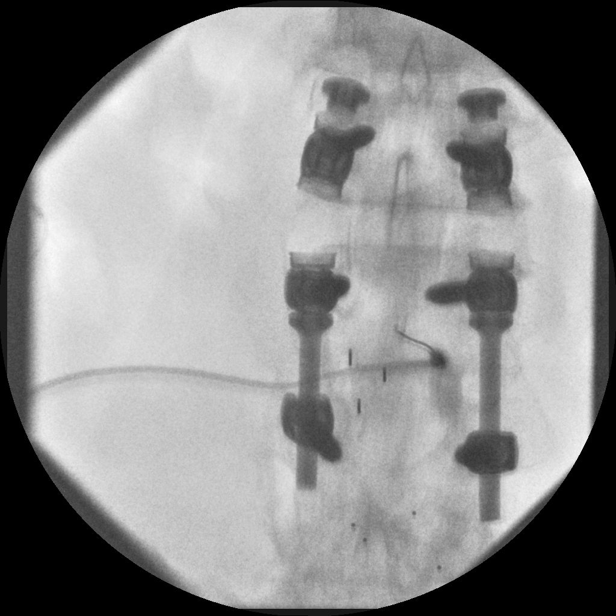

[Series 3: myelogram  white · 1 of 1 slices shown (1 of 9)]
[im 1/1]
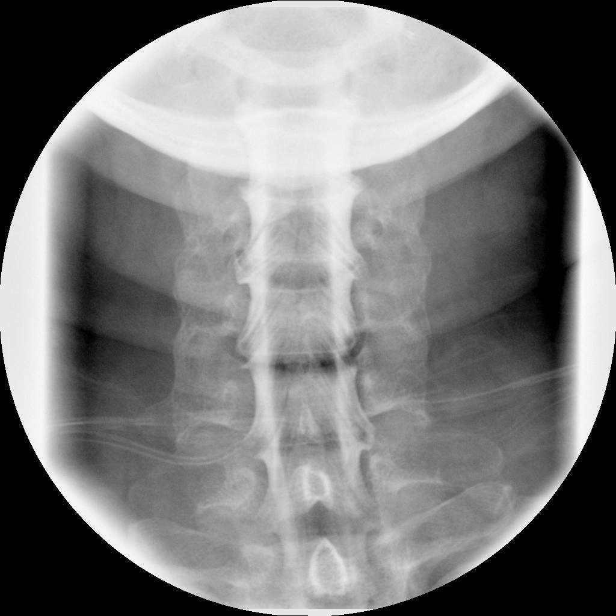

[Series 4: myelogram  white · 1 of 1 slices shown (2 of 9)]
[im 1/1]
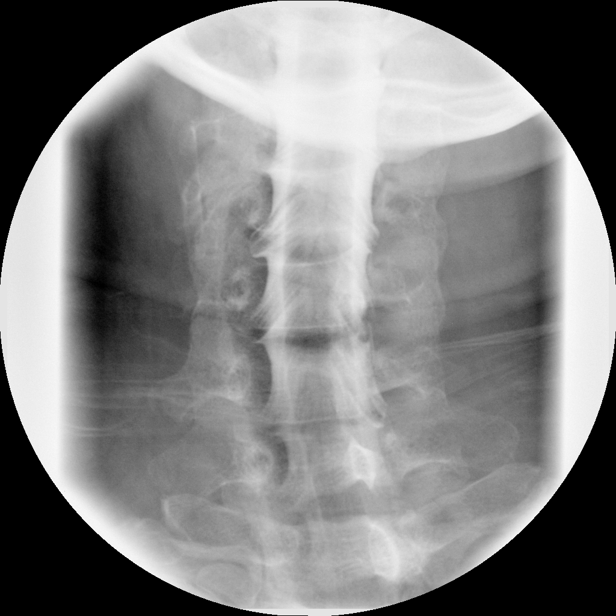

[Series 5: myelogram  white · 1 of 1 slices shown (3 of 9)]
[im 1/1]
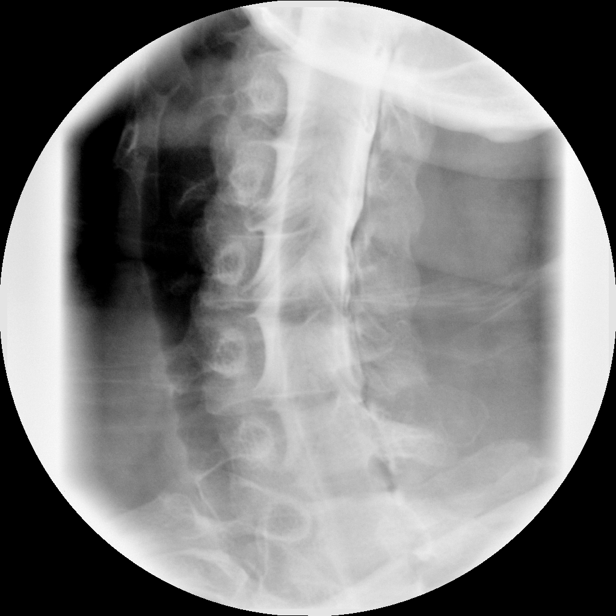

[Series 6: myelogram  white · 1 of 1 slices shown (4 of 9)]
[im 1/1]
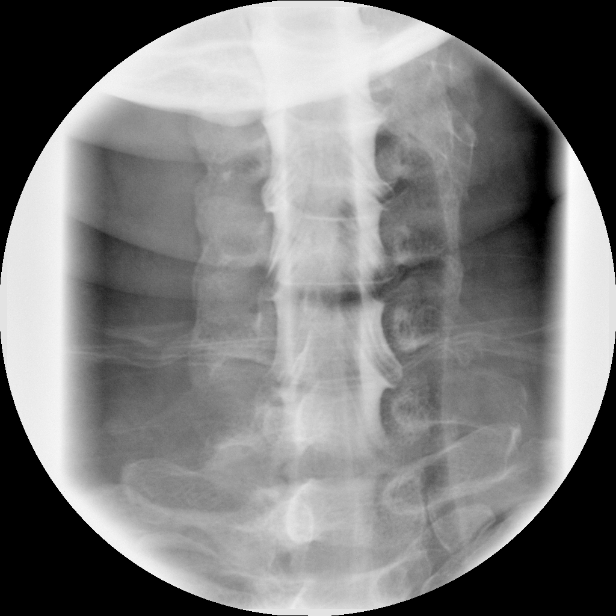

[Series 7: myelogram  white · 1 of 1 slices shown (5 of 9)]
[im 1/1]
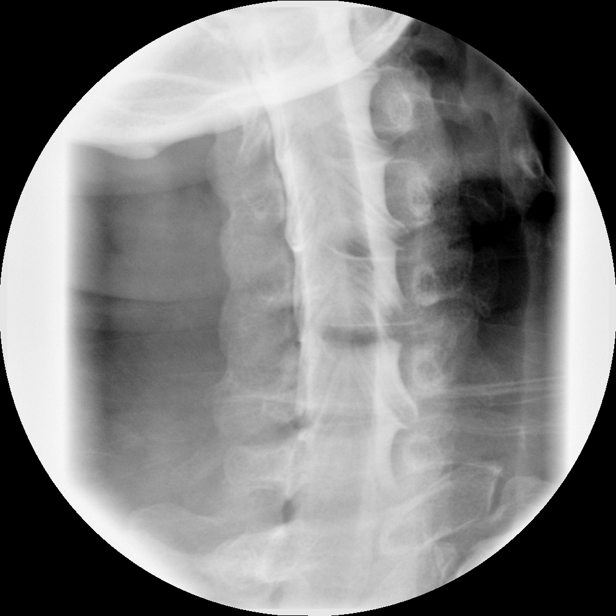

[Series 8: myelogram  white · 1 of 1 slices shown (6 of 9)]
[im 1/1]
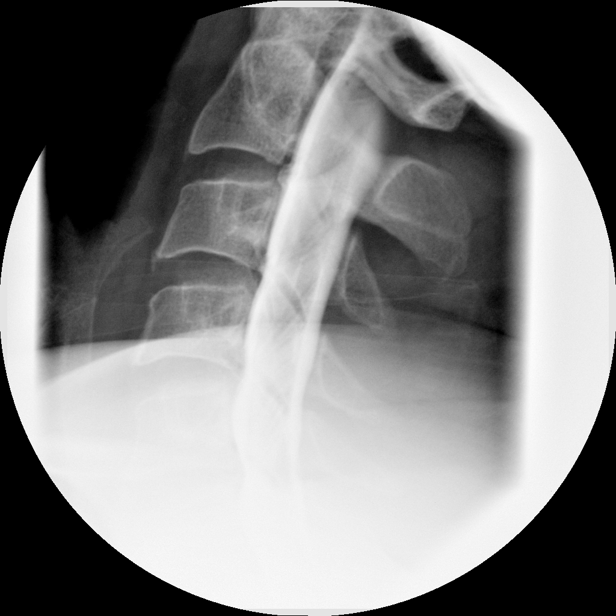

[Series 10: myelogram  white · 1 of 1 slices shown (7 of 9)]
[im 1/1]
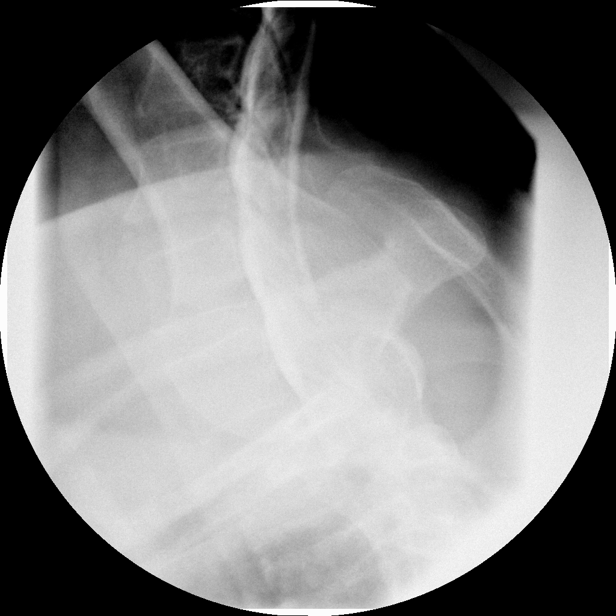

[Series 11: myelogram  white · 1 of 1 slices shown (8 of 9)]
[im 1/1]
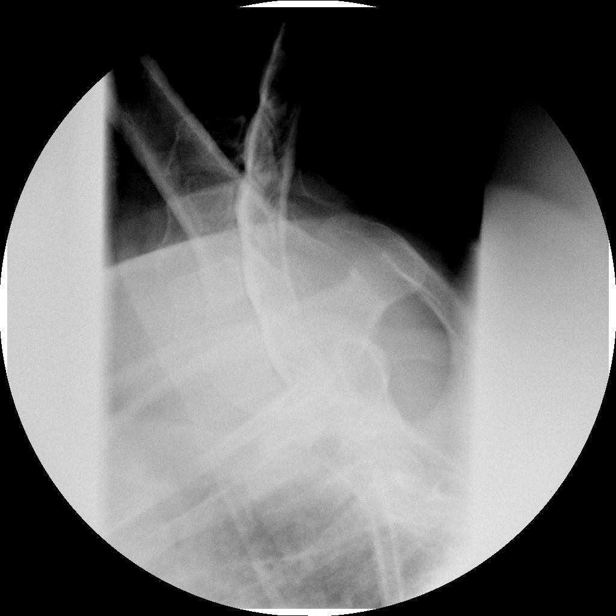

[Series 12: myelogram  white · 1 of 1 slices shown (9 of 9)]
[im 1/1]
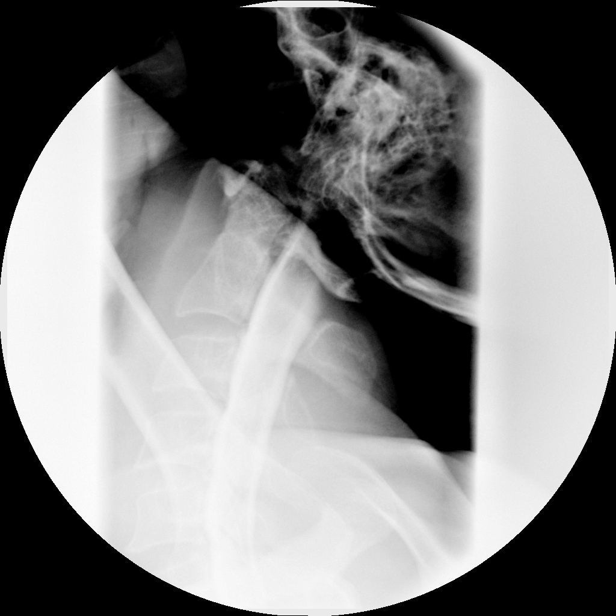

[11 of 11 positions shown; findings below may reference images not displayed]

FLUOROSCOPY TIME:  57 seconds.  Twelve spot films.

PROCEDURE:
LUMBAR PUNCTURE FOR CERVICAL MYELOGRAM

After thorough discussion of risks and benefits of the procedure
including bleeding, infection, injury to nerves, blood vessels,
adjacent structures as well as headache and CSF leak, written and
oral informed consent was obtained. Consent was obtained by Dr. Kr
Jumper. We discussed the high likelihood of obtaining a diagnostic
study.

Patient was positioned prone on the fluoroscopy table. Local
anesthesia was provided with 1% lidocaine without epinephrine after
prepped and draped in the usual sterile fashion. Puncture was
performed at L3-L4 using a 3 1/2 inch 22-gauge spinal needle via
midline approach. Using a single pass through the dura, the needle
was placed within the thecal sac, with return of clear CSF. 10 mL of
4mnipaque-TYY was injected into the thecal sac, with normal
opacification of the nerve roots and cauda equina consistent with
free flow within the subarachnoid space. The patient was then moved
to the trendelenburg position and contrast flowed into the Cervical
spine region.

I personally performed the lumbar puncture and administered the
intrathecal contrast. I also personally supervised acquisition of
the myelogram images.
FINDINGS: CERVICAL MYELOGRAM FINDINGS:

Good opacification of the cervical subarachnoid space. Extradural
defect at C5-6 on the RIGHT resulting in significant RIGHT C6 nerve
root truncation. Shallow ventral defect as well without cord
compression.

CT CERVICAL MYELOGRAM FINDINGS:

No prevertebral or paraspinous masses.  Lung apices clear.

The individual disc spaces were examined as follows:

C2-3:  Normal.

C3-4:  Normal.

C4-5:  Normal.

C5-6: Central and rightward protrusion. Significant RIGHT C6 nerve
root impingement.

C6-7:  Normal.

C7-T1: Normal disc space. Mild facet arthropathy without
impingement.
IMPRESSION: Central and rightward protrusion at C5-6. Significant RIGHT C6 nerve
root impingement.
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
## 2018-02-22 ENCOUNTER — Telehealth: Payer: Self-pay | Admitting: Nurse Practitioner

## 2018-02-22 ENCOUNTER — Other Ambulatory Visit: Payer: Self-pay | Admitting: Physician Assistant

## 2018-02-22 DIAGNOSIS — M546 Pain in thoracic spine: Secondary | ICD-10-CM

## 2018-02-22 NOTE — Telephone Encounter (Signed)
Phone call to patient to verify medication list and allergies for myelogram procedure. Pt instructed to hold trazodone, wellbutrin, celexa and tramadol 48 hrs prior to myelogram appointment time. Pt verbalized understanding.

## 2018-02-28 ENCOUNTER — Other Ambulatory Visit: Payer: Self-pay | Admitting: Physician Assistant

## 2018-02-28 DIAGNOSIS — M546 Pain in thoracic spine: Secondary | ICD-10-CM

## 2018-03-02 ENCOUNTER — Ambulatory Visit
Admission: RE | Admit: 2018-03-02 | Discharge: 2018-03-02 | Disposition: A | Discharge status: Home / Self Care | Payer: 59 | Source: Ambulatory Visit | Attending: Physician Assistant | Admitting: Physician Assistant

## 2018-03-02 VITALS — BP 115/69 | HR 61

## 2018-03-02 DIAGNOSIS — M546 Pain in thoracic spine: Secondary | ICD-10-CM

## 2018-03-02 DIAGNOSIS — M48062 Spinal stenosis, lumbar region with neurogenic claudication: Secondary | ICD-10-CM

## 2018-03-02 MED ORDER — IOPAMIDOL (ISOVUE-M 300) INJECTION 61%
10.0000 mL | Freq: Once | INTRAMUSCULAR | Status: AC | PRN
  Administered 2018-03-02: 10 mL via INTRATHECAL

## 2018-03-02 MED ORDER — ONDANSETRON HCL 4 MG/2ML IJ SOLN: 4.0000 mg | Freq: Four times a day (QID) | INTRAMUSCULAR | Status: DC | PRN

## 2018-03-02 MED ORDER — DIAZEPAM 5 MG PO TABS
10.0000 mg | ORAL_TABLET | Freq: Once | ORAL | Status: AC
  Administered 2018-03-02: 10 mg via ORAL

## 2018-03-02 MED ORDER — MEPERIDINE HCL 50 MG/ML IJ SOLN
50.0000 mg | Freq: Once | INTRAMUSCULAR | Status: AC
  Administered 2018-03-02: 50 mg via INTRAMUSCULAR

## 2018-03-02 MED ORDER — ONDANSETRON HCL 4 MG/2ML IJ SOLN
4.0000 mg | Freq: Once | INTRAMUSCULAR | Status: AC
  Administered 2018-03-02: 4 mg via INTRAMUSCULAR

## 2018-03-02 NOTE — Discharge Instructions (Signed)
Myelogram Discharge Instructions  1. Go home and rest quietly for the next 24 hours.  It is important to lie flat for the next 24 hours.  Get up only to go to the restroom.  You may lie in the bed or on a couch on your back, your stomach, your left side or your right side.  You may have one pillow under your head.  You may have pillows between your knees while you are on your side or under your knees while you are on your back.  2. DO NOT drive today.  Recline the seat as far back as it will go, while still wearing your seat belt, on the way home.  3. You may get up to go to the bathroom as needed.  You may sit up for 10 minutes to eat.  You may resume your normal diet and medications unless otherwise indicated.  Drink lots of extra fluids today and tomorrow.  4. The incidence of headache, nausea, or vomiting is about 5% (one in 20 patients).  If you develop a headache, lie flat and drink plenty of fluids until the headache goes away.  Caffeinated beverages may be helpful.  If you develop severe nausea and vomiting or a headache that does not go away with flat bed rest, call [phone].  5. You may resume normal activities after your 24 hours of bed rest is over; however, do not exert yourself strongly or do any heavy lifting tomorrow. If when you get up you have a headache when standing, go back to bed and force fluids for another 24 hours.  6. Call your physician for a follow-up appointment.  The results of your myelogram will be sent directly to your physician by the following day.  7. If you have any questions or if complications develop after you arrive home, please call [phone].  Discharge instructions have been explained to the patient.  The patient, or the person responsible for the patient, fully understands these instructions.  YOU MAY RESTART YOUR TRAZODONE, WELLBUTRIN, TRAMADOL AND CELEXA TOMORROW 03/03/2018 AT 10:30AM.

## 2018-03-02 NOTE — Progress Notes (Signed)
Patient states he has been off Celexa, Tramadol, Trazodone and Wellbutrin for at least the past two days.

## 2018-10-09 ENCOUNTER — Other Ambulatory Visit: Payer: Self-pay | Admitting: Orthopaedic Surgery

## 2018-10-09 DIAGNOSIS — M25512 Pain in left shoulder: Secondary | ICD-10-CM

## 2018-10-22 ENCOUNTER — Other Ambulatory Visit: Payer: Self-pay | Admitting: Orthopaedic Surgery

## 2018-10-22 DIAGNOSIS — M25512 Pain in left shoulder: Secondary | ICD-10-CM

## 2018-12-04 ENCOUNTER — Ambulatory Visit
Admission: RE | Admit: 2018-12-04 | Discharge: 2018-12-04 | Disposition: A | Discharge status: Home / Self Care | Payer: BC Managed Care – PPO | Source: Ambulatory Visit | Attending: Orthopaedic Surgery | Admitting: Orthopaedic Surgery

## 2018-12-04 ENCOUNTER — Other Ambulatory Visit: Payer: Self-pay

## 2018-12-04 DIAGNOSIS — M25512 Pain in left shoulder: Secondary | ICD-10-CM

## 2018-12-04 MED ORDER — IOPAMIDOL (ISOVUE-M 200) INJECTION 41%
13.0000 mL | Freq: Once | INTRAMUSCULAR | Status: AC
  Administered 2018-12-04: 13 mL via INTRA_ARTICULAR

## 2019-08-10 ENCOUNTER — Other Ambulatory Visit: Payer: Self-pay

## 2019-08-10 ENCOUNTER — Encounter: Payer: Self-pay | Admitting: Emergency Medicine

## 2019-08-10 ENCOUNTER — Ambulatory Visit (INDEPENDENT_AMBULATORY_CARE_PROVIDER_SITE_OTHER): Payer: BC Managed Care – PPO

## 2019-08-10 ENCOUNTER — Ambulatory Visit
Admission: EM | Admit: 2019-08-10 | Discharge: 2019-08-10 | Disposition: A | Discharge status: Home / Self Care | Payer: BC Managed Care – PPO | Attending: Urgent Care | Admitting: Urgent Care

## 2019-08-10 DIAGNOSIS — M545 Low back pain: Secondary | ICD-10-CM

## 2019-08-10 DIAGNOSIS — M5441 Lumbago with sciatica, right side: Secondary | ICD-10-CM

## 2019-08-10 DIAGNOSIS — X500XXA Overexertion from strenuous movement or load, initial encounter: Secondary | ICD-10-CM

## 2019-08-10 MED ORDER — CYCLOBENZAPRINE HCL 10 MG PO TABS: 10.0000 mg | ORAL_TABLET | Freq: Two times a day (BID) | ORAL | 0 refills | Status: DC | PRN

## 2019-08-10 MED ORDER — HYDROCODONE-ACETAMINOPHEN 5-325 MG PO TABS: 1.0000 | ORAL_TABLET | Freq: Two times a day (BID) | ORAL | 0 refills | Status: DC | PRN

## 2019-08-10 MED ORDER — DEXAMETHASONE SODIUM PHOSPHATE 10 MG/ML IJ SOLN
10.0000 mg | Freq: Once | INTRAMUSCULAR | Status: AC
  Administered 2019-08-10: 10 mg via INTRAMUSCULAR

## 2019-08-10 MED ORDER — METHYLPREDNISOLONE 4 MG PO TBPK: ORAL_TABLET | ORAL | 0 refills | Status: DC

## 2019-08-10 NOTE — ED Triage Notes (Signed)
Patient states that he bent down and felt sharp pain on the right sided lower back on Tuesday.  Patient states that the pain goes down his right leg.  Patient denies fall.

## 2019-08-10 NOTE — Discharge Instructions (Signed)
It was very nice seeing you today in clinic. Thank you for entrusting me with your care.   Please utilize the medications that we discussed. Your prescriptions for the steroids have been called in to your pharmacy. Start medication TOMORROW (08/11/2019), as you were treated in clinic with the injection. Continue Tramadol as previously prescribed. Change muscle relaxer to Flexeril.  Will send in a very short course of Norco for as needed use.  Avoid overdoing it, but you need to make efforts to remain active as tolerated.  Avoiding activity all together can make your pain worse. Apply moist heat for 15-20 minutes at a time at least 3-4 times a day.  Make arrangements to follow up with your regular doctor/neurosurgeon in 1 week for re-evaluation. If your symptoms/condition worsens, please seek follow up care either here or in the ER. Please remember, our [facility] providers are "right here with you" when you need [place].   Again, it was my pleasure to take care of you today. Thank you for choosing our clinic. I hope that you start to feel better quickly.   Quentin Mulling, MSN, APRN, FNP-C, CEN Advanced Practice Provider [facility]
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
## 2020-07-10 ENCOUNTER — Telehealth (HOSPITAL_COMMUNITY): Payer: Self-pay | Admitting: Licensed Clinical Social Worker

## 2020-07-10 NOTE — Telephone Encounter (Signed)
Follow up on VM about CDIOP. Clt state starting other program. Will call back if additional resources needed.

## 2020-10-07 ENCOUNTER — Encounter (HOSPITAL_BASED_OUTPATIENT_CLINIC_OR_DEPARTMENT_OTHER): Payer: Self-pay | Admitting: Emergency Medicine

## 2020-10-07 ENCOUNTER — Ambulatory Visit (HOSPITAL_COMMUNITY)
Admission: RE | Admit: 2020-10-07 | Discharge: 2020-10-07 | Disposition: A | Discharge status: Home / Self Care | Payer: Medicare Other | Source: Ambulatory Visit | Attending: Nurse Practitioner | Admitting: Nurse Practitioner

## 2020-10-07 ENCOUNTER — Encounter (HOSPITAL_COMMUNITY): Payer: Self-pay

## 2020-10-07 ENCOUNTER — Emergency Department (HOSPITAL_BASED_OUTPATIENT_CLINIC_OR_DEPARTMENT_OTHER)
Admission: EM | Admit: 2020-10-07 | Discharge: 2020-10-07 | Disposition: A | Discharge status: Home / Self Care | Payer: Medicare Other | Attending: Emergency Medicine | Admitting: Emergency Medicine

## 2020-10-07 ENCOUNTER — Emergency Department (HOSPITAL_BASED_OUTPATIENT_CLINIC_OR_DEPARTMENT_OTHER): Payer: Medicare Other

## 2020-10-07 ENCOUNTER — Other Ambulatory Visit: Payer: Self-pay

## 2020-10-07 VITALS — BP 141/85 | HR 83 | Temp 99.1°F | Resp 17

## 2020-10-07 DIAGNOSIS — R1084 Generalized abdominal pain: Secondary | ICD-10-CM | POA: Insufficient documentation

## 2020-10-07 DIAGNOSIS — I10 Essential (primary) hypertension: Secondary | ICD-10-CM | POA: Insufficient documentation

## 2020-10-07 DIAGNOSIS — U071 COVID-19: Secondary | ICD-10-CM | POA: Diagnosis not present

## 2020-10-07 DIAGNOSIS — Z79899 Other long term (current) drug therapy: Secondary | ICD-10-CM | POA: Diagnosis not present

## 2020-10-07 DIAGNOSIS — Z28310 Unvaccinated for covid-19: Secondary | ICD-10-CM | POA: Diagnosis not present

## 2020-10-07 DIAGNOSIS — R1011 Right upper quadrant pain: Secondary | ICD-10-CM

## 2020-10-07 DIAGNOSIS — R109 Unspecified abdominal pain: Secondary | ICD-10-CM | POA: Diagnosis present

## 2020-10-07 LAB — URINALYSIS, ROUTINE W REFLEX MICROSCOPIC
Bilirubin Urine: NEGATIVE
Glucose, UA: NEGATIVE mg/dL
Hgb urine dipstick: NEGATIVE
Ketones, ur: NEGATIVE mg/dL
Leukocytes,Ua: NEGATIVE
Nitrite: NEGATIVE
Specific Gravity, Urine: 1.043 — ABNORMAL HIGH (ref 1.005–1.030)
pH: 5.5 (ref 5.0–8.0)

## 2020-10-07 LAB — TROPONIN I (HIGH SENSITIVITY): Troponin I (High Sensitivity): <2 ng/L (ref <18)

## 2020-10-07 LAB — COMPREHENSIVE METABOLIC PANEL
ALT: 20 U/L (ref 0–44)
AST: 24 U/L (ref 15–41)
Albumin: 4.5 g/dL (ref 3.5–5.0)
Alkaline Phosphatase: 72 U/L (ref 38–126)
Anion gap: 9 (ref 5–15)
BUN: 19 mg/dL (ref 6–20)
CO2: 27 mmol/L (ref 22–32)
Calcium: 9.4 mg/dL (ref 8.9–10.3)
Chloride: 103 mmol/L (ref 98–111)
Creatinine, Ser: 0.86 mg/dL (ref 0.61–1.24)
GFR, Estimated: >60 mL/min (ref >60)
Glucose, Bld: 81 mg/dL (ref 70–99)
Potassium: 4.1 mmol/L (ref 3.5–5.1)
Sodium: 139 mmol/L (ref 135–145)
Total Bilirubin: 1.2 mg/dL (ref 0.3–1.2)
Total Protein: 7.6 g/dL (ref 6.5–8.1)

## 2020-10-07 LAB — CBC WITH DIFFERENTIAL/PLATELET
Abs Immature Granulocytes: 0.02 10*3/uL (ref 0.00–0.07)
Basophils Absolute: 0 10*3/uL (ref 0.0–0.1)
Basophils Relative: 0 %
Eosinophils Absolute: 0.1 10*3/uL (ref 0.0–0.5)
Eosinophils Relative: 2 %
HCT: 42.3 % (ref 39.0–52.0)
Hemoglobin: 14.5 g/dL (ref 13.0–17.0)
Immature Granulocytes: 0 %
Lymphocytes Relative: 34 %
Lymphs Abs: 1.6 10*3/uL (ref 0.7–4.0)
MCH: 30.5 pg (ref 26.0–34.0)
MCHC: 34.3 g/dL (ref 30.0–36.0)
MCV: 89.1 fL (ref 80.0–100.0)
Monocytes Absolute: 0.6 10*3/uL (ref 0.1–1.0)
Monocytes Relative: 12 %
Neutro Abs: 2.6 10*3/uL (ref 1.7–7.7)
Neutrophils Relative %: 52 %
Platelets: 167 10*3/uL (ref 150–400)
RBC: 4.75 MIL/uL (ref 4.22–5.81)
RDW: 14.1 % (ref 11.5–15.5)
WBC: 4.8 10*3/uL (ref 4.0–10.5)
nRBC: 0 % (ref 0.0–0.2)

## 2020-10-07 LAB — RESP PANEL BY RT-PCR (FLU A&B, COVID) ARPGX2
Influenza A by PCR: NEGATIVE
Influenza B by PCR: NEGATIVE
SARS Coronavirus 2 by RT PCR: POSITIVE — AB

## 2020-10-07 LAB — PROTIME-INR
INR: 1 (ref 0.8–1.2)
Prothrombin Time: 13.2 seconds (ref 11.4–15.2)

## 2020-10-07 LAB — LIPASE, BLOOD: Lipase: 17 U/L (ref 11–51)

## 2020-10-07 MED ORDER — SUCRALFATE 1 G PO TABS: 1.0000 g | ORAL_TABLET | Freq: Three times a day (TID) | ORAL | 0 refills | Status: AC

## 2020-10-07 MED ORDER — PANTOPRAZOLE SODIUM 20 MG PO TBEC: 20.0000 mg | DELAYED_RELEASE_TABLET | Freq: Every day | ORAL | 0 refills | Status: AC

## 2020-10-07 MED ORDER — SODIUM CHLORIDE 0.9 % IV BOLUS
1000.0000 mL | Freq: Once | INTRAVENOUS | Status: AC
  Administered 2020-10-07: 1000 mL via INTRAVENOUS

## 2020-10-07 MED ORDER — PANTOPRAZOLE SODIUM 40 MG IV SOLR
40.0000 mg | Freq: Once | INTRAVENOUS | Status: AC
  Administered 2020-10-07: 40 mg via INTRAVENOUS
  Filled 2020-10-07: qty 40

## 2020-10-07 MED ORDER — IOHEXOL 300 MG/ML  SOLN
80.0000 mL | Freq: Once | INTRAMUSCULAR | Status: AC | PRN
  Administered 2020-10-07: 80 mL via INTRAVENOUS

## 2020-10-07 NOTE — ED Notes (Signed)
ED Provider at bedside. 

## 2020-10-07 NOTE — ED Notes (Addendum)
CRITICAL VALUE STICKER  CRITICAL VALUE:COVID +  RECEIVER (on-site recipient of call):Carmon Ginsberg, RN, CN  DATE & TIME NOTIFIED: 10/07/2020 1609  MESSENGER (representative from lab):  MD NOTIFIED: Dr. Lockie Mola  TIME OF NOTIFICATION:1611  RESPONSE:

## 2020-10-07 NOTE — ED Notes (Signed)
Patient is being discharged from the Urgent Care and sent to the Emergency Department via POV . Per Linus Mako, PA, patient is in need of higher level of care due to abdominal pain and fevers. Patient is aware and verbalizes understanding of plan of care.  Vitals:   10/07/20 1214  BP: (!) 141/85  Pulse: 83  Resp: 17  Temp: 99.1 F (37.3 C)  SpO2: 97%
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
## 2020-10-07 NOTE — ED Triage Notes (Signed)
Pt sent from UC related to an ultra sound. Pt stated that he has had abdominal pain since Monday and states the pain is in the LUQ. Pt also complains of pain in RUQ upon palpitation.

## 2020-10-07 NOTE — Discharge Instructions (Addendum)
COVID isolation per CDC  Day 0 is your first day of symptoms or a positive viral test. Day 1 is the first full day after your symptoms developed or your test specimen was collected. If you have COVID-19 or have symptoms, isolate for at least 5 days.  IF YOU Tested positive for COVID-19 or have symptoms, regardless of vaccination status  Stay home for at least 5 days Stay home for 5 days and isolate from others in your home.  Wear a well-fitting mask if you must be around others in your home.  Do not travel.  Ending isolation if you had symptoms End isolation after 5 full days if you are fever-free for 24 hours (without the use of fever-reducing medication) and your symptoms are improving.  Ending isolation if you did NOT have symptoms End isolation after at least 5 full days after your positive test.  If you got very sick from COVID-19 or have a weakened immune system You should isolate for at least 10 days. Consult your doctor before ending isolation.  Take precautions until day 10  Wear a well-fitting mask  Wear a well-fitting mask for 10 full days any time you are around others inside your home or in public. Do not go to places where you are unable to wear a mask.  Do not travel Do not travel until a full 10 days after your symptoms started or the date your positive test was taken if you had no symptoms.  Avoid being around people who are more likely to get very sick from COVID-19.
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
## 2020-10-07 NOTE — Discharge Instructions (Signed)
With your pain and fevers I do recommend further evaluation in the Emergency department of your choice, now

## 2020-10-07 NOTE — ED Triage Notes (Signed)
Pt c/o nasal congestion and right upper abdominal pain.  Pt states he has not ate since Sunday.

## 2020-10-07 NOTE — ED Provider Notes (Signed)
Patient here with nausea, abdominal pain.  Awaiting CT scan of abdomen and pelvis.  Suspect gastritis.  Lab work thus far unremarkable.  Patient positive for COVID.  CT scan abdomen and pelvis negative.  Overall patient states that he had COVID back in January.  He states he had a fever couple days ago and has had some sinus congestion.  Suspect that this could be a reinfection.  He is not vaccinated.  He was given isolation guidelines and recommend that he confirmed this to [facility] website.  Overall he appears well.  No respiratory symptoms.  Will prescribe Protonix and Carafate for GI symptoms.  Understands return precautions.  Discharged in good condition.  This chart was dictated using voice recognition software.  Despite best efforts to proofread,  errors can occur which can change the documentation meaning.     Virgina Norfolk, DO 10/07/20 1623

## 2020-10-07 NOTE — ED Notes (Signed)
Patient transported to CT 

## 2020-10-08 ENCOUNTER — Encounter: Payer: Self-pay | Admitting: Adult Health

## 2020-10-08 ENCOUNTER — Other Ambulatory Visit (HOSPITAL_COMMUNITY): Payer: Self-pay

## 2020-10-08 ENCOUNTER — Other Ambulatory Visit: Payer: Self-pay | Admitting: Adult Health

## 2020-10-08 MED ORDER — NIRMATRELVIR/RITONAVIR (PAXLOVID)TABLET
3.0000 | ORAL_TABLET | Freq: Two times a day (BID) | ORAL | 0 refills | Status: AC
  Filled 2020-10-08: qty 30, 5d supply, fill #0

## 2020-10-08 NOTE — Progress Notes (Signed)
Outpatient Oral COVID Treatment Note  I connected with [name] on 10/08/2020/10:35 AM by telephone and verified that I am speaking with the correct person using two identifiers.  I discussed the limitations, risks, security, and privacy concerns of performing an evaluation and management service by telephone and the availability of in person appointments. I also discussed with the patient that there may be a patient responsible charge related to this service. The patient expressed understanding and agreed to proceed.  Patient location: home Provider location: home  Diagnosis: COVID-19 infection  Purpose of visit: Discussion of potential use of Molnupiravir or Paxlovid, a new treatment for mild to moderate COVID-19 viral infection in non-hospitalized patients.   Subjective: Patient is a 50 y.o. male who has been diagnosed with COVID 19 viral infection.  Their symptoms began on 10/05/2020 with fever    Past Medical History:  Diagnosis Date  . Anxiety   . Arthritis   . Chronic anal fissure   . Chronic low back pain    has spinal cord stimulator implant   . Complex sleep apnea syndrome    study done 05-01-2013  recommendations lose wt. ,  quit alcohol, and cpap but pt refused/  states he did lose wt and quit alcohol Jan 2017  . History of suicide attempt   . Hypertension   . Major depression   . Polysubstance dependence including opioid type drug, episodic abuse (HCC)    cocaine,  narcotic's, benzo's, marjiuana:   as of 02-02-2016 per pt quit Jan 2017,  sees counselor at [facility]  . Prolapsed internal hemorrhoids, grade 3   . Recovering alcoholic in remission [facility])    per pt since Jan 2017-- sees counselor at [facility]  . 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 S/P insertion of spinal cord stimulator   . Wears glasses     Allergies reviewed  Current Outpatient Medications:  .  buPROPion (WELLBUTRIN XL) 150 MG 24 hr tablet, Take 150 mg by mouth every morning., Disp: , Rfl:  .  acetaminophen (TYLENOL)  500 MG tablet, Take 1,000 mg by mouth every 6 (six) hours as needed for moderate pain., Disp: , Rfl:  .  atenolol (TENORMIN) 25 MG tablet, Take 1 tablet (25 mg total) by mouth daily. (Patient taking differently: Take 25 mg by mouth every morning. ), Disp: 30 tablet, Rfl: 2 .  gabapentin (NEURONTIN) 600 MG tablet, Take 600 mg by mouth 3 (three) times daily., Disp: , Rfl:  .  hydrochlorothiazide (HYDRODIURIL) 25 MG tablet, Take 25 mg by mouth every morning. , Disp: , Rfl:  .  ibuprofen (ADVIL,MOTRIN) 200 MG tablet, Take 600 mg by mouth every 6 (six) hours as needed for moderate pain., Disp: , Rfl:  .  pantoprazole (PROTONIX) 20 MG tablet, Take 1 tablet (20 mg total) by mouth daily for 14 days., Disp: 14 tablet, Rfl: 0 .  sucralfate (CARAFATE) 1 g tablet, Take 1 tablet (1 g total) by mouth 4 (four) times daily -  with meals and at bedtime for 14 days., Disp: 56 tablet, Rfl: 0  Objective: Patient appears/sounds moderately well.  They are in no apparent distress.  Breathing is non labored.  Mood and behavior are normal.  Laboratory Data:  Recent Results (from the past 2160 hour(s))  Comprehensive metabolic panel     Status: None   Collection Time: 10/07/20  1:12 PM  Result Value Ref Range   Sodium 139 135 - 145 mmol/L   Potassium 4.1 3.5 - 5.1 mmol/L  Chloride 103 98 - 111 mmol/L   CO2 27 22 - 32 mmol/L   Glucose, Bld 81 70 - 99 mg/dL    Comment: Glucose reference range applies only to samples taken after fasting for at least 8 hours.   BUN 19 6 - 20 mg/dL   Creatinine, Ser 1.610.86 0.61 - 1.24 mg/dL   Calcium 9.4 8.9 - 09.610.3 mg/dL   Total Protein 7.6 6.5 - 8.1 g/dL   Albumin 4.5 3.5 - 5.0 g/dL   AST 24 15 - 41 U/L   ALT 20 0 - 44 U/L   Alkaline Phosphatase 72 38 - 126 U/L   Total Bilirubin 1.2 0.3 - 1.2 mg/dL   GFR, Estimated >04>60 >54>60 mL/min    Comment: (NOTE) Calculated using the CKD-EPI Creatinine Equation (2021)    Anion gap 9 5 - 15    Comment: Performed at [facility]   Lipase, blood     Status: None   Collection Time: 10/07/20  1:12 PM  Result Value Ref Range   Lipase 17 11 - 51 U/L    Comment: Performed at [facility]  Troponin I (High Sensitivity)     Status: None   Collection Time: 10/07/20  1:12 PM  Result Value Ref Range   Troponin I (High Sensitivity) <2 <18 ng/L    Comment: (NOTE) Elevated high sensitivity troponin I (hsTnI) values and significant  changes across serial measurements may suggest ACS but many other  chronic and acute conditions are known to elevate hsTnI results.  Refer to the "Links" section for chest pain algorithms and additional  guidance. Performed at [facility]   CBC with Differential     Status: None   Collection Time: 10/07/20  1:12 PM  Result Value Ref Range   WBC 4.8 4.0 - 10.5 K/uL   RBC 4.75 4.22 - 5.81 MIL/uL   Hemoglobin 14.5 13.0 - 17.0 g/dL   HCT 09.842.3 11.939.0 - 14.752.0 %   MCV 89.1 80.0 - 100.0 fL   MCH 30.5 26.0 - 34.0 pg   MCHC 34.3 30.0 - 36.0 g/dL   RDW 82.914.1 56.211.5 - 13.015.5 %   Platelets 167 150 - 400 K/uL   nRBC 0.0 0.0 - 0.2 %   Neutrophils Relative % 52 %   Neutro Abs 2.6 1.7 - 7.7 K/uL   Lymphocytes Relative 34 %   Lymphs Abs 1.6 0.7 - 4.0 K/uL   Monocytes Relative 12 %   Monocytes Absolute 0.6 0.1 - 1.0 K/uL   Eosinophils Relative 2 %   Eosinophils Absolute 0.1 0.0 - 0.5 K/uL   Basophils Relative 0 %   Basophils Absolute 0.0 0.0 - 0.1 K/uL   Immature Granulocytes 0 %   Abs Immature Granulocytes 0.02 0.00 - 0.07 K/uL    Comment: Performed at [facility]  Protime-INR     Status: None   Collection Time: 10/07/20  1:12 PM  Result Value Ref Range   Prothrombin Time 13.2 11.4 - 15.2 seconds   INR 1.0 0.8 - 1.2    Comment: (NOTE) INR goal varies based on device and disease states. Performed at [facility]   Urinalysis, Routine w reflex microscopic     Status: Abnormal   Collection Time: 10/07/20  1:12 PM  Result Value Ref  Range   Color, Urine YELLOW YELLOW   APPearance CLEAR CLEAR   Specific Gravity, Urine 1.043 (H) 1.005 - 1.030   pH  5.5 5.0 - 8.0   Glucose, UA NEGATIVE NEGATIVE mg/dL   Hgb urine dipstick NEGATIVE NEGATIVE   Bilirubin Urine NEGATIVE NEGATIVE   Ketones, ur NEGATIVE NEGATIVE mg/dL   Protein, ur TRACE (A) NEGATIVE mg/dL   Nitrite NEGATIVE NEGATIVE   Leukocytes,Ua NEGATIVE NEGATIVE    Comment: Performed at [facility]  Resp Panel by RT-PCR (Flu A&B, Covid) Nasopharyngeal Swab     Status: Abnormal   Collection Time: 10/07/20  3:00 PM   Specimen: Nasopharyngeal Swab; Nasopharyngeal(NP) swabs in vial transport medium  Result Value Ref Range   SARS Coronavirus 2 by RT PCR POSITIVE (A) NEGATIVE    Comment: RESULT CALLED TO, READ BACK BY AND VERIFIED WITH: TIM SMITH,RN 1610 10/08/2018 DBRADLEY (NOTE) SARS-CoV-2 target nucleic acids are DETECTED.  The SARS-CoV-2 RNA is generally detectable in upper respiratory specimens during the acute phase of infection. Positive results are indicative of the presence of the identified virus, but do not rule out bacterial infection or co-infection with other pathogens not detected by the test. Clinical correlation with patient history and other diagnostic information is necessary to determine patient infection status. The expected result is Negative.  Fact Sheet for Patients: BloggerCourse.com  Fact Sheet for Healthcare Providers: SeriousBroker.it  This test is not yet approved or cleared by the [place] FDA and  has been authorized for detection and/or diagnosis of SARS-CoV-2 by FDA under an Emergency Use Authorization (EUA).  This EUA will remain in effect (meaning this test can  be used) for the duration of  the COVID-19 declaration under Section 564(b)(1) of the Act, 21 U.S.C. section 360bbb-3(b)(1), unless the authorization is terminated or revoked sooner.     Influenza A  by PCR NEGATIVE NEGATIVE   Influenza B by PCR NEGATIVE NEGATIVE    Comment: (NOTE) The Xpert Xpress SARS-CoV-2/FLU/RSV plus assay is intended as an aid in the diagnosis of influenza from Nasopharyngeal swab specimens and should not be used as a sole basis for treatment. Nasal washings and aspirates are unacceptable for Xpert Xpress SARS-CoV-2/FLU/RSV testing.  Fact Sheet for Patients: BloggerCourse.com  Fact Sheet for Healthcare Providers: SeriousBroker.it  This test is not yet approved or cleared by the [place] FDA and has been authorized for detection and/or diagnosis of SARS-CoV-2 by FDA under an Emergency Use Authorization (EUA). This EUA will remain in effect (meaning this test can be used) for the duration of the COVID-19 declaration under Section 564(b)(1) of the Act, 21 U.S.C. section 360bbb-3(b)(1), unless the authorization is terminated or revoked.  Performed at [facility]      Assessment: 50 y.o. male with mild/moderate COVID 19 viral infection diagnosed on 10/07/2020 at high risk for progression to severe COVID 19.  Plan:  This patient is a 50 y.o. male that meets the following criteria for Emergency Use Authorization of: Paxlovid 1. Age >12 yr AND > 40 kg 2. SARS-COV-2 positive test 3. Symptom onset < 5 days 4. Mild-to-moderate COVID disease with high risk for severe progression to hospitalization or death  I have spoken and communicated the following to the patient or parent/caregiver regarding: 1. Paxlovid is an unapproved drug that is authorized for use under an Emergency Use Authorization.  2. There are no adequate, approved, available products for the treatment of COVID-19 in adults who have mild-to-moderate COVID-19 and are at high risk for progressing to severe COVID-19, including hospitalization or death. 3. Other therapeutics are currently authorized. For additional information on all  products authorized for  treatment or prevention of COVID-19, please see https://www.graham-miller.com/.  4. There are benefits and risks of taking this treatment as outlined in the "Fact Sheet for Patients and Caregivers."  5. "Fact Sheet for Patients and Caregivers" was reviewed with patient. A hard copy will be provided to patient from pharmacy prior to the patient receiving treatment. 6. Patients should continue to self-isolate and use infection control measures (e.g., wear mask, isolate, social distance, avoid sharing personal items, clean and disinfect "high touch" surfaces, and frequent handwashing) according to CDC guidelines.  7. The patient or parent/caregiver has the option to accept or refuse treatment. 8. Patient medication history was reviewed for potential drug interactions:Interaction with home meds: Wellbutrin, may be less effective.   9. Patient's GFR was calculated to be greater than 60, and they were therefore prescribed Normal dose (GFR>60) - nirmatrelvir 150mg  tab (2 tablet) by mouth twice daily AND ritonavir 100mg  tab (1 tablet) by mouth twice daily   After reviewing above information with the patient, the patient agrees to receive Paxlovid.  Follow up instructions:    . Take prescription BID x 5 days as directed . Reach out to pharmacist for counseling on medication if desired . For concerns regarding further COVID symptoms please follow up with your PCP or urgent care . For urgent or life-threatening issues, seek care at your local emergency department  The patient was provided an opportunity to ask questions, and all were answered. The patient agreed with the plan and demonstrated an understanding of the instructions.   Script sent to [facility] pharmacy and opted to pick up RX.  The patient was advised to call their PCP or seek an in-person evaluation if the symptoms  worsen or if the condition fails to improve as anticipated.   I provided 10 minutes of non face-to-face telephone visit time during this encounter, and > 50% was spent counseling as documented under my assessment & plan.  , NP 10/08/2020 /10:35 AM

## 2020-10-16 ENCOUNTER — Other Ambulatory Visit (HOSPITAL_COMMUNITY): Payer: Self-pay

## 2020-11-30 ENCOUNTER — Encounter (INDEPENDENT_AMBULATORY_CARE_PROVIDER_SITE_OTHER): Payer: Self-pay

## 2020-11-30 ENCOUNTER — Ambulatory Visit (INDEPENDENT_AMBULATORY_CARE_PROVIDER_SITE_OTHER): Payer: Medicare Other

## 2020-11-30 ENCOUNTER — Ambulatory Visit: Payer: Medicare Other | Admitting: Podiatry

## 2020-11-30 ENCOUNTER — Other Ambulatory Visit: Payer: Self-pay

## 2020-11-30 ENCOUNTER — Encounter: Payer: Self-pay | Admitting: Podiatry

## 2020-11-30 DIAGNOSIS — M2041 Other hammer toe(s) (acquired), right foot: Secondary | ICD-10-CM

## 2020-11-30 DIAGNOSIS — M19071 Primary osteoarthritis, right ankle and foot: Secondary | ICD-10-CM | POA: Diagnosis not present

## 2020-11-30 NOTE — Progress Notes (Signed)
  Subjective:  Patient ID: [name], male    DOB: [birth year],  MRN: [id number]  Chief Complaint  Patient presents with  . Hammer Toe    50 y.o. male presents with the above complaint. History confirmed with patient.  Thinks he may have stopped it at some point.  Most of the pain is centered around a hard bulbous area on the third toe, he has some pain around the first metatarsophalangeal joint as well  Objective:  Physical Exam: warm, good capillary refill, no trophic changes or ulcerative lesions, normal DP and PT pulses and normal sensory exam.  Rigid mallet toe deformity of the right third toe.  He has limited range of motion with pain of the first metatarsal and second metatarsophalangeal joints  Radiographs: X-ray of the right foot: Flexion deformity at DIPJ of right third toe, there is dorsal spurring and joint space narrowing of the first and second metatarsophalangeal joints Assessment:   1. Hammertoe of right foot   2. Osteoarthritis of first metatarsophalangeal (MTP) joint of right foot      Plan:  Patient was evaluated and treated and all questions answered.  Reviewed radiographs with patient we discussed both surgical and nonsurgical treatment.  Think he likely has a sequela of prior blunt injury with dislocation of the DIPJ that is healed in a poor position.  Completely rigid will not help with bracing.  Flexor tenotomy not an option.  Recommended surgical correction with arthroplasty of the DIPJ.  We also discussed treatment for the osteoarthritis that he has evidence of in the first and second metatarsal phalangeal joints.  These are only intermittently bothersome.  We will plan for injection therapy at the same time of surgery and see how he does with this.  We discussed all risk, benefits and potential complications including not limited to pain, swelling, infection, scar, numbness which may be temporary or permanent, chronic pain, stiffness, nerve pain or damage,  wound healing problems.  Informed consent was signed and reviewed.  All questions were addressed.   Surgical plan:  Procedure: -Right third DIPJ arthroplasty, injection of first and second metatarsal phalangeal joint  Location: -[facility] surgical Center  Anesthesia plan: -IV sedation with local anesthesia  Postoperative pain plan: - Tylenol 1000 mg every 6 hours, ibuprofen 600 mg every 6 hours, oxycodone 5 mg as needed  DVT prophylaxis: -None required  WB Restrictions / DME needs: -Surgical shoe weightbearing as tolerated dispensed today    No follow-ups on file.

## 2020-12-01 ENCOUNTER — Telehealth: Payer: Self-pay | Admitting: Urology

## 2020-12-01 NOTE — Telephone Encounter (Signed)
DOS - 12/18/20  HAMMERTOE REPAIR 3RD RIGHT --- 24818 ARTHROCENTESIS X'S 2 RIGHT --- 20600  [facility] EFFECTIVE DATE - 11/25/20   PLAN DEDUCTIBLE - $0.00 OUT OF POCKET - $4,500.00 W/ $4,370.00 REMAINING COINSURANCE - 0% COPAY - $325   SPOKE WITH CEE WITH [employer] AND SHE STATED THAT FOR CPT CODES 59093 AND 20600 X'S 2 NO PRIOR AUTH IS REQUIRED.  REF # [id number]

## 2020-12-18 ENCOUNTER — Telehealth: Payer: Medicare Other | Admitting: Podiatry

## 2020-12-18 ENCOUNTER — Telehealth: Payer: Self-pay | Admitting: *Deleted

## 2020-12-18 ENCOUNTER — Other Ambulatory Visit: Payer: Self-pay | Admitting: Podiatry

## 2020-12-18 ENCOUNTER — Telehealth: Payer: Self-pay | Admitting: Podiatry

## 2020-12-18 DIAGNOSIS — M150 Primary generalized (osteo)arthritis: Secondary | ICD-10-CM

## 2020-12-18 DIAGNOSIS — M2041 Other hammer toe(s) (acquired), right foot: Secondary | ICD-10-CM

## 2020-12-18 MED ORDER — OXYCODONE-ACETAMINOPHEN 5-325 MG PO TABS: 1.0000 | ORAL_TABLET | Freq: Four times a day (QID) | ORAL | 0 refills | Status: AC | PRN

## 2020-12-18 MED ORDER — IBUPROFEN 600 MG PO TABS: 600.0000 mg | ORAL_TABLET | Freq: Four times a day (QID) | ORAL | 0 refills | Status: AC | PRN

## 2020-12-18 MED ORDER — IBUPROFEN 600 MG PO TABS: 600.0000 mg | ORAL_TABLET | Freq: Four times a day (QID) | ORAL | 0 refills | Status: DC | PRN

## 2020-12-18 MED ORDER — OXYCODONE-ACETAMINOPHEN 5-325 MG PO TABS: 1.0000 | ORAL_TABLET | Freq: Four times a day (QID) | ORAL | 0 refills | Status: DC | PRN

## 2020-12-18 NOTE — Telephone Encounter (Signed)
Please  resend patients medication to desired [facility] in [place].

## 2020-12-18 NOTE — Telephone Encounter (Signed)
Pt stated that he had sx on his middle toe-its bleeding through the bandages. Its red and its blood on his carpet. He did state that the foot is elevated. Please call patient

## 2020-12-18 NOTE — Telephone Encounter (Signed)
Patient is calling because his right middle toe is a lot of bright red blood on the bandage and was told to contact the office if this happens. Please advise.

## 2020-12-18 NOTE — Telephone Encounter (Signed)
Returned call to patient, informed per Dr Vara Guardian message, verbalized understanding.

## 2020-12-21 ENCOUNTER — Ambulatory Visit (INDEPENDENT_AMBULATORY_CARE_PROVIDER_SITE_OTHER): Payer: Medicare Other | Admitting: Podiatry

## 2020-12-21 ENCOUNTER — Other Ambulatory Visit: Payer: Self-pay

## 2020-12-21 DIAGNOSIS — T8131XA Disruption of external operation (surgical) wound, not elsewhere classified, initial encounter: Secondary | ICD-10-CM

## 2020-12-21 MED ORDER — CLINDAMYCIN HCL 300 MG PO CAPS: 300.0000 mg | ORAL_CAPSULE | Freq: Three times a day (TID) | ORAL | 0 refills | Status: AC

## 2020-12-21 NOTE — Progress Notes (Signed)
  Subjective:  Patient ID: [name], male    DOB: 06/03/1971,  MRN: [id number]  Chief Complaint  Patient presents with   Post-op Problem      Suture  pulled through - per Hillary Schwegler    DOS: 12/18/2020 Procedure: Arthroplasty right third DIPJ  50 y.o. male returns for post-op check.  Call me over the weekend he bumped his toe and pulled sutures through  Review of Systems: Negative except as noted in the HPI. Denies N/V/F/Ch.   Objective:  There were no vitals filed for this visit. There is no height or weight on file to calculate BMI. Constitutional Well developed. Well nourished.  Vascular Foot warm and well perfused. Capillary refill normal to all digits.   Neurologic Normal speech. Oriented to person, place, and time. Epicritic sensation to light touch grossly present bilaterally.  Dermatologic Superficial dehiscence with disruption of 2 of the simple interrupted sutures.  No exposed tendon bone or joint mild erythema.  Orthopedic: Tenderness to palpation noted about the surgical site.    Assessment:  No diagnosis found. Plan:  Patient was evaluated and treated and all questions answered.  S/p foot surgery right  -After sterile prep with Betadine and a digital block with 1.5 cc each of 2% lidocaine plain and 0.5% Marcaine plain and using sterile instrumentation I closed the wound dehiscence using 4-0 Prolene.  Dressing reapplied.  1 week clindamycin sent to pharmacy as a precaution.  Would like to reevaluate next week  Return in about 8 days (around 12/29/2020).

## 2020-12-24 ENCOUNTER — Encounter: Payer: Medicare Other | Admitting: Podiatry

## 2020-12-29 ENCOUNTER — Other Ambulatory Visit: Payer: Self-pay

## 2020-12-29 ENCOUNTER — Ambulatory Visit (INDEPENDENT_AMBULATORY_CARE_PROVIDER_SITE_OTHER): Payer: Medicare Other

## 2020-12-29 ENCOUNTER — Ambulatory Visit (INDEPENDENT_AMBULATORY_CARE_PROVIDER_SITE_OTHER): Payer: Medicare Other | Admitting: Podiatry

## 2020-12-29 DIAGNOSIS — M19071 Primary osteoarthritis, right ankle and foot: Secondary | ICD-10-CM

## 2020-12-29 DIAGNOSIS — M2041 Other hammer toe(s) (acquired), right foot: Secondary | ICD-10-CM | POA: Diagnosis not present

## 2021-01-02 ENCOUNTER — Encounter: Payer: Self-pay | Admitting: Podiatry

## 2021-01-02 NOTE — Progress Notes (Signed)
  Subjective:  Patient ID: [name], male    DOB: [birth year],  MRN: [id number]  Chief Complaint  Patient presents with   Routine Post Op    (xray)POV #1 DOS 12/18/2020 HAMMERTOE CORRECTION 3RD TOE, INJECTION JOINT GREAT TOE, SECOND TOE RT FOOT    DOS: 12/18/2020 Procedure: Arthroplasty right third DIPJ  50 y.o. male returns for post-op check.  Doing much better  Review of Systems: Negative except as noted in the HPI. Denies N/V/F/Ch.   Objective:  There were no vitals filed for this visit. There is no height or weight on file to calculate BMI. Constitutional Well developed. Well nourished.  Vascular Foot warm and well perfused. Capillary refill normal to all digits.   Neurologic Normal speech. Oriented to person, place, and time. Epicritic sensation to light touch grossly present bilaterally.  Dermatologic Superficial dehiscence with disruption of 2 of the simple interrupted sutures.  No exposed tendon bone or joint mild erythema.  Orthopedic: Tenderness to palpation noted about the surgical site.   Multiple view radiographs of right foot status post arthroplasty of the DIPJ of the third toe Assessment:   1. Hammertoe of right foot   2. Osteoarthritis of first metatarsophalangeal (MTP) joint of right foot    Plan:  Patient was evaluated and treated and all questions answered.  S/p foot surgery right  -Doing well seem to be healing well.  We will leave sutures for 1 more week  Return in about 9 days (around 01/07/2021) for suture removal.

## 2021-01-07 ENCOUNTER — Ambulatory Visit (INDEPENDENT_AMBULATORY_CARE_PROVIDER_SITE_OTHER): Payer: Medicare Other | Admitting: Podiatry

## 2021-01-07 ENCOUNTER — Encounter: Payer: Self-pay | Admitting: Podiatry

## 2021-01-07 ENCOUNTER — Other Ambulatory Visit: Payer: Self-pay

## 2021-01-07 DIAGNOSIS — M25371 Other instability, right ankle: Secondary | ICD-10-CM

## 2021-01-07 DIAGNOSIS — M2041 Other hammer toe(s) (acquired), right foot: Secondary | ICD-10-CM

## 2021-01-07 DIAGNOSIS — M19071 Primary osteoarthritis, right ankle and foot: Secondary | ICD-10-CM

## 2021-01-07 NOTE — Progress Notes (Signed)
  Subjective:  Patient ID: [name], male    DOB: April 23, 1971,  MRN: [id number]  Chief Complaint  Patient presents with   Routine Post Op       POV #2 DOS 12/18/2020 HAMMERTOE CORRECTION 3RD TOE, INJECTION JOINT GREAT TOE, SECOND TOE RT FOOT    DOS: 12/18/2020 Procedure: Arthroplasty right third DIPJ  50 y.o. male returns for post-op check.  Doing well not having much pain  Review of Systems: Negative except as noted in the HPI. Denies N/V/F/Ch.   Objective:  There were no vitals filed for this visit. There is no height or weight on file to calculate BMI. Constitutional Well developed. Well nourished.  Vascular Foot warm and well perfused. Capillary refill normal to all digits.   Neurologic Normal speech. Oriented to person, place, and time. Epicritic sensation to light touch grossly present bilaterally.  Dermatologic Superficial dehiscence with disruption of 2 of the simple interrupted sutures.  No exposed tendon bone or joint mild erythema.  Orthopedic: Tenderness to palpation noted about the surgical site.   Multiple view radiographs of right foot status post arthroplasty of the DIPJ of the third toe Assessment:   No diagnosis found.  Plan:  Patient was evaluated and treated and all questions answered.  S/p foot surgery right  -Sutures removed without complication, he does have some limited stiffness in the joint and unclear if he will actually regain any function in the DIPJ but position of the toe is much improved.  Return in 3 weeks for final postop visit   He also mention at the end the visit he has had issues with ankle instability on the right side.  I think he likely has sprain or possible partial tear of ATFL and CFL as well as possibility of cartilaginous damage.  Would like him to start physical therapy rehab exercises at home for this to begin strengthening and stability.  We will reevaluate further at next visit consider physical therapy if needed  Return  in about 3 weeks (around 01/28/2021) for post op (no x-rays).

## 2021-01-28 ENCOUNTER — Encounter: Payer: Medicare Other | Admitting: Podiatry

## 2021-02-01 ENCOUNTER — Other Ambulatory Visit: Payer: Self-pay

## 2021-02-01 DIAGNOSIS — R202 Paresthesia of skin: Secondary | ICD-10-CM

## 2021-02-02 ENCOUNTER — Other Ambulatory Visit: Payer: Self-pay

## 2021-02-02 ENCOUNTER — Ambulatory Visit: Payer: Medicare Other | Admitting: Neurology

## 2021-02-02 ENCOUNTER — Ambulatory Visit (INDEPENDENT_AMBULATORY_CARE_PROVIDER_SITE_OTHER): Payer: Medicare Other | Admitting: Podiatry

## 2021-02-02 DIAGNOSIS — M2041 Other hammer toe(s) (acquired), right foot: Secondary | ICD-10-CM

## 2021-02-02 DIAGNOSIS — G5602 Carpal tunnel syndrome, left upper limb: Secondary | ICD-10-CM

## 2021-02-02 DIAGNOSIS — M19071 Primary osteoarthritis, right ankle and foot: Secondary | ICD-10-CM

## 2021-02-02 DIAGNOSIS — R202 Paresthesia of skin: Secondary | ICD-10-CM

## 2021-02-02 DIAGNOSIS — M25371 Other instability, right ankle: Secondary | ICD-10-CM

## 2021-02-02 NOTE — Procedures (Signed)
[facility]  [address] [place], [address]  [place], [place] [address] Tel: [phone] Fax:  [phone] Test Date:  02/02/2021  Patient: [name] DOB: 12-09-1970 Physician: Nita Sickle, DO  Sex: Male Height: 5\' 8"  Ref Phys: , MD  ID#: Bjorn Pippin   Technician:    Patient Complaints: This is a 50 year old man referred for evaluation of left hand paresthesias.    NCV & EMG Findings: Extensive electrodiagnostic testing of the left upper extremity shows:  Left mixed palmar sensory responses show prolonged latency.  Left median and ulnar sensory responses are within normal limits. Left median and ulnar motor responses are within normal limits. Chronic motor axon loss changes are seen affecting the left abductor pollicis brevis muscle, without accompanying active denervation.  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     ___________________________ 44, DO    Nerve Conduction Studies Anti Sensory Summary Table   Stim Site NR Peak (ms) Norm Peak (ms) P-T Amp (V) Norm P-T Amp  Left Median Anti Sensory (2nd Digit)  35C  Wrist    3.6 <3.6 19.0 >15  Left Ulnar Anti Sensory (5th Digit)  35C  Wrist    2.6 <3.1 20.5 >10   Motor Summary Table   Stim Site NR Onset (ms) Norm Onset (ms) O-P Amp (mV) Norm O-P Amp Site1 Site2 Delta-0 (ms) Dist (cm) Vel (m/s) Norm Vel (m/s)  Left Median Motor (Abd Poll Brev)  35C  Wrist    3.5 <4.0 6.4 >6 Elbow Wrist 5.6 30.0 54 >50  Elbow    9.1  6.0         Left Ulnar Motor (Abd Dig Minimi)  35C  Wrist    2.2 <3.1 11.1 >7 B Elbow Wrist 4.0 25.0 63 >50  B Elbow    6.2  10.7  A Elbow B Elbow 1.7 10.0 59 >50  A Elbow    7.9  10.7          Comparison Summary Table   Stim Site NR Peak (ms) Norm Peak (ms) P-T Amp (V) Site1 Site2 Delta-P (ms) Norm Delta (ms)  Left Median/Ulnar Palm Comparison (Wrist - 8cm)  35C  Median Palm     2.2 <2.2 20.0 Median Palm Ulnar Palm 0.8   Ulnar Palm    1.4 <2.2 18.3       EMG   Side Muscle Ins Act Fibs Psw Fasc Number Recrt Dur Dur. Amp Amp. Poly Poly. Comment  Left 1stDorInt Nml Nml Nml Nml Nml Nml Nml Nml Nml Nml Nml Nml N/A  Left Abd Poll Brev Nml Nml Nml Nml 1- Rapid Some 1+ Some 1+ Some 1+ N/A  Left PronatorTeres Nml Nml Nml Nml Nml Nml Nml Nml Nml Nml Nml Nml N/A  Left Biceps Nml Nml Nml Nml Nml Nml Nml Nml Nml Nml Nml Nml N/A  Left Triceps Nml Nml Nml Nml Nml Nml Nml Nml Nml Nml Nml Nml N/A  Left Deltoid Nml Nml Nml Nml Nml Nml Nml Nml Nml Nml Nml Nml N/A      Waveforms:

## 2021-02-03 NOTE — Progress Notes (Signed)
  Subjective:  Patient ID: [name], male    DOB: March 18, 1971,  MRN: [id number]  Chief Complaint  Patient presents with   Routine Post Op    POV #3 DOS 12/18/2020 HAMMERTOE CORRECTION 3RD TOE, INJECTION JOINT GREAT TOE, SECOND TOE RT FOOT    DOS: 12/18/2020 Procedure: Arthroplasty right third DIPJ  50 y.o. male returns for post-op check.  He is doing well has been able to resume nearly all his full activities  Review of Systems: Negative except as noted in the HPI. Denies N/V/F/Ch.   Objective:  There were no vitals filed for this visit. There is no height or weight on file to calculate BMI. Constitutional Well developed. Well nourished.  Vascular Foot warm and well perfused. Capillary refill normal to all digits.   Neurologic Normal speech. Oriented to person, place, and time. Epicritic sensation to light touch grossly present bilaterally.  Dermatologic Incision is well-healed  Orthopedic: No pain at toe, he has minimal range of motion of the DIPJ and there is significant edema still   Multiple view radiographs of right foot status post arthroplasty of the DIPJ of the third toe Assessment:   1. Osteoarthritis of first metatarsophalangeal (MTP) joint of right foot   2. Hammertoe of right foot   3. Right ankle instability     Plan:  Patient was evaluated and treated and all questions answered.  S/p foot surgery right  -Overall doing well he can resume all his normal activities.  Return to see me as needed   Ankle is doing much better not having issues with it currently  Return if symptoms worsen or fail to improve.

## 2023-10-30 ENCOUNTER — Other Ambulatory Visit (HOSPITAL_COMMUNITY): Payer: Self-pay | Admitting: Physician Assistant

## 2023-10-30 ENCOUNTER — Ambulatory Visit
Admission: RE | Admit: 2023-10-30 | Discharge: 2023-10-30 | Disposition: A | Discharge status: Home / Self Care | Source: Ambulatory Visit | Attending: Physician Assistant | Admitting: Physician Assistant

## 2023-10-30 ENCOUNTER — Other Ambulatory Visit: Payer: Self-pay | Admitting: Physician Assistant

## 2023-10-30 ENCOUNTER — Encounter: Payer: Self-pay | Admitting: Physician Assistant

## 2023-10-30 DIAGNOSIS — M5412 Radiculopathy, cervical region: Secondary | ICD-10-CM

## 2023-10-30 DIAGNOSIS — M5030 Other cervical disc degeneration, unspecified cervical region: Secondary | ICD-10-CM

## 2023-10-31 ENCOUNTER — Encounter (HOSPITAL_BASED_OUTPATIENT_CLINIC_OR_DEPARTMENT_OTHER): Payer: Self-pay

## 2023-10-31 ENCOUNTER — Ambulatory Visit (HOSPITAL_BASED_OUTPATIENT_CLINIC_OR_DEPARTMENT_OTHER): Admission: RE | Admit: 2023-10-31 | Source: Ambulatory Visit

## 2023-11-03 ENCOUNTER — Encounter: Payer: Self-pay | Admitting: Physician Assistant
